# Patient Record
Sex: Female | Born: 1965 | Race: Black or African American | Hispanic: No | Smoking: Never smoker
Health system: Southern US, Community
[De-identification: ages and names within clinical notes are randomized; demographics above are authoritative.]

## PROBLEM LIST (undated history)

## (undated) DIAGNOSIS — E7800 Pure hypercholesterolemia, unspecified: Secondary | ICD-10-CM

## (undated) DIAGNOSIS — Z7952 Long term (current) use of systemic steroids: Secondary | ICD-10-CM

## (undated) DIAGNOSIS — M069 Rheumatoid arthritis, unspecified: Secondary | ICD-10-CM

## (undated) DIAGNOSIS — K219 Gastro-esophageal reflux disease without esophagitis: Secondary | ICD-10-CM

## (undated) DIAGNOSIS — D649 Anemia, unspecified: Secondary | ICD-10-CM

## (undated) DIAGNOSIS — N3281 Overactive bladder: Secondary | ICD-10-CM

## (undated) DIAGNOSIS — I10 Essential (primary) hypertension: Secondary | ICD-10-CM

## (undated) DIAGNOSIS — N185 Chronic kidney disease, stage 5: Secondary | ICD-10-CM

## (undated) DIAGNOSIS — K5900 Constipation, unspecified: Secondary | ICD-10-CM

## (undated) DIAGNOSIS — Z8739 Personal history of other diseases of the musculoskeletal system and connective tissue: Secondary | ICD-10-CM

## (undated) DIAGNOSIS — E559 Vitamin D deficiency, unspecified: Secondary | ICD-10-CM

## (undated) DIAGNOSIS — M329 Systemic lupus erythematosus, unspecified: Secondary | ICD-10-CM

## (undated) DIAGNOSIS — Z79899 Other long term (current) drug therapy: Secondary | ICD-10-CM

## (undated) DIAGNOSIS — I214 Non-ST elevation (NSTEMI) myocardial infarction: Secondary | ICD-10-CM

## (undated) DIAGNOSIS — R600 Localized edema: Secondary | ICD-10-CM

## (undated) DIAGNOSIS — I2510 Atherosclerotic heart disease of native coronary artery without angina pectoris: Secondary | ICD-10-CM

## (undated) DIAGNOSIS — R197 Diarrhea, unspecified: Secondary | ICD-10-CM

## (undated) DIAGNOSIS — H35719 Central serous chorioretinopathy, unspecified eye: Secondary | ICD-10-CM

## (undated) DIAGNOSIS — G9332 Myalgic encephalomyelitis/chronic fatigue syndrome: Secondary | ICD-10-CM

## (undated) HISTORY — DX: Central serous chorioretinopathy, unspecified eye: H35.719

## (undated) HISTORY — DX: Rheumatoid arthritis, unspecified: M06.9

## (undated) HISTORY — PX: TUBAL LIGATION: SHX77

## (undated) HISTORY — PX: MOUTH SURGERY: SHX715

## (undated) HISTORY — PX: UMBILICAL HERNIA REPAIR: SHX196

## (undated) HISTORY — DX: Localized edema: R60.0

## (undated) HISTORY — PX: HERNIA REPAIR: SHX51

## (undated) HISTORY — DX: Long term (current) use of systemic steroids: Z79.52

## (undated) HISTORY — PX: BREAST SURGERY: SHX581

## (undated) HISTORY — DX: Constipation, unspecified: K59.00

## (undated) HISTORY — DX: Other long term (current) drug therapy: Z79.899

## (undated) HISTORY — DX: Overactive bladder: N32.81

## (undated) HISTORY — PX: LAPAROSCOPIC CHOLECYSTECTOMY: SUR755

## (undated) HISTORY — DX: Vitamin D deficiency, unspecified: E55.9

## (undated) HISTORY — DX: Myalgic encephalomyelitis/chronic fatigue syndrome: G93.32

---

## 2011-01-25 DIAGNOSIS — M329 Systemic lupus erythematosus, unspecified: Secondary | ICD-10-CM | POA: Diagnosis present

## 2011-01-25 DIAGNOSIS — M3214 Glomerular disease in systemic lupus erythematosus: Secondary | ICD-10-CM | POA: Insufficient documentation

## 2011-10-10 DIAGNOSIS — N049 Nephrotic syndrome with unspecified morphologic changes: Secondary | ICD-10-CM | POA: Insufficient documentation

## 2012-01-17 DIAGNOSIS — I10 Essential (primary) hypertension: Secondary | ICD-10-CM | POA: Diagnosis present

## 2013-09-10 DIAGNOSIS — Z79899 Other long term (current) drug therapy: Secondary | ICD-10-CM | POA: Insufficient documentation

## 2014-01-13 DIAGNOSIS — M1A09X0 Idiopathic chronic gout, multiple sites, without tophus (tophi): Secondary | ICD-10-CM | POA: Insufficient documentation

## 2014-02-18 DIAGNOSIS — D631 Anemia in chronic kidney disease: Secondary | ICD-10-CM | POA: Insufficient documentation

## 2014-02-18 DIAGNOSIS — N184 Chronic kidney disease, stage 4 (severe): Secondary | ICD-10-CM

## 2014-03-24 ENCOUNTER — Encounter (HOSPITAL_BASED_OUTPATIENT_CLINIC_OR_DEPARTMENT_OTHER): Payer: Self-pay | Admitting: Emergency Medicine

## 2014-03-24 ENCOUNTER — Emergency Department (HOSPITAL_BASED_OUTPATIENT_CLINIC_OR_DEPARTMENT_OTHER): Payer: Medicare Other

## 2014-03-24 ENCOUNTER — Emergency Department (HOSPITAL_BASED_OUTPATIENT_CLINIC_OR_DEPARTMENT_OTHER)
Admission: EM | Admit: 2014-03-24 | Discharge: 2014-03-24 | Disposition: A | Discharge status: Other Acute Inpatient Hospital | Payer: Medicare Other | Attending: Emergency Medicine | Admitting: Emergency Medicine

## 2014-03-24 DIAGNOSIS — Z792 Long term (current) use of antibiotics: Secondary | ICD-10-CM | POA: Insufficient documentation

## 2014-03-24 DIAGNOSIS — R197 Diarrhea, unspecified: Secondary | ICD-10-CM | POA: Insufficient documentation

## 2014-03-24 DIAGNOSIS — Z87448 Personal history of other diseases of urinary system: Secondary | ICD-10-CM | POA: Insufficient documentation

## 2014-03-24 DIAGNOSIS — Z431 Encounter for attention to gastrostomy: Secondary | ICD-10-CM | POA: Insufficient documentation

## 2014-03-24 DIAGNOSIS — Z8739 Personal history of other diseases of the musculoskeletal system and connective tissue: Secondary | ICD-10-CM | POA: Insufficient documentation

## 2014-03-24 DIAGNOSIS — K5669 Other intestinal obstruction: Secondary | ICD-10-CM | POA: Diagnosis not present

## 2014-03-24 DIAGNOSIS — I10 Essential (primary) hypertension: Secondary | ICD-10-CM | POA: Insufficient documentation

## 2014-03-24 DIAGNOSIS — K56609 Unspecified intestinal obstruction, unspecified as to partial versus complete obstruction: Secondary | ICD-10-CM

## 2014-03-24 DIAGNOSIS — Z4659 Encounter for fitting and adjustment of other gastrointestinal appliance and device: Secondary | ICD-10-CM

## 2014-03-24 DIAGNOSIS — Z3202 Encounter for pregnancy test, result negative: Secondary | ICD-10-CM | POA: Diagnosis not present

## 2014-03-24 DIAGNOSIS — R109 Unspecified abdominal pain: Secondary | ICD-10-CM

## 2014-03-24 DIAGNOSIS — Z79899 Other long term (current) drug therapy: Secondary | ICD-10-CM | POA: Insufficient documentation

## 2014-03-24 HISTORY — DX: Essential (primary) hypertension: I10

## 2014-03-24 HISTORY — DX: Systemic lupus erythematosus, unspecified: M32.9

## 2014-03-24 LAB — CBC WITH DIFFERENTIAL/PLATELET
BASOS ABS: 0 10*3/uL (ref 0.0–0.1)
BASOS PCT: 0 % (ref 0–1)
Band Neutrophils: 3 % (ref 0–10)
Blasts: 0 %
EOS PCT: 0 % (ref 0–5)
Eosinophils Absolute: 0 10*3/uL (ref 0.0–0.7)
HCT: 36.8 % (ref 36.0–46.0)
HEMOGLOBIN: 11.5 g/dL — AB (ref 12.0–15.0)
LYMPHS PCT: 24 % (ref 12–46)
Lymphs Abs: 2.2 10*3/uL (ref 0.7–4.0)
MCH: 29.7 pg (ref 26.0–34.0)
MCHC: 31.3 g/dL (ref 30.0–36.0)
MCV: 95.1 fL (ref 78.0–100.0)
MONO ABS: 0.6 10*3/uL (ref 0.1–1.0)
MONOS PCT: 6 % (ref 3–12)
MYELOCYTES: 0 %
Metamyelocytes Relative: 0 %
NEUTROS ABS: 6.5 10*3/uL (ref 1.7–7.7)
NRBC: 0 /100{WBCs}
Neutrophils Relative %: 67 % (ref 43–77)
PLATELETS: 210 10*3/uL (ref 150–400)
Promyelocytes Absolute: 0 %
RBC: 3.87 MIL/uL (ref 3.87–5.11)
RDW: 13.4 % (ref 11.5–15.5)
WBC: 9.3 10*3/uL (ref 4.0–10.5)

## 2014-03-24 LAB — URINE MICROSCOPIC-ADD ON

## 2014-03-24 LAB — PREGNANCY, URINE: PREG TEST UR: NEGATIVE

## 2014-03-24 LAB — COMPREHENSIVE METABOLIC PANEL
ALT: 11 U/L (ref 0–35)
ANION GAP: 7 (ref 5–15)
AST: 15 U/L (ref 0–37)
Albumin: 2.9 g/dL — ABNORMAL LOW (ref 3.5–5.2)
Alkaline Phosphatase: 67 U/L (ref 39–117)
BUN: 34 mg/dL — AB (ref 6–23)
CALCIUM: 8.3 mg/dL — AB (ref 8.4–10.5)
CO2: 27 mmol/L (ref 19–32)
Chloride: 111 mEq/L (ref 96–112)
Creatinine, Ser: 2.18 mg/dL — ABNORMAL HIGH (ref 0.50–1.10)
GFR, EST AFRICAN AMERICAN: 29 mL/min — AB (ref >90)
GFR, EST NON AFRICAN AMERICAN: 25 mL/min — AB (ref >90)
GLUCOSE: 125 mg/dL — AB (ref 70–99)
Potassium: 3.8 mmol/L (ref 3.5–5.1)
SODIUM: 145 mmol/L (ref 135–145)
TOTAL PROTEIN: 5.8 g/dL — AB (ref 6.0–8.3)
Total Bilirubin: 0.5 mg/dL (ref 0.3–1.2)

## 2014-03-24 LAB — URINALYSIS, ROUTINE W REFLEX MICROSCOPIC
Bilirubin Urine: NEGATIVE
GLUCOSE, UA: NEGATIVE mg/dL
KETONES UR: NEGATIVE mg/dL
Leukocytes, UA: NEGATIVE
Nitrite: NEGATIVE
Protein, ur: >300 mg/dL — AB
SPECIFIC GRAVITY, URINE: 1.017 (ref 1.005–1.030)
Urobilinogen, UA: 0.2 mg/dL (ref 0.0–1.0)
pH: 5.5 (ref 5.0–8.0)

## 2014-03-24 LAB — LIPASE, BLOOD: Lipase: 24 U/L (ref 11–59)

## 2014-03-24 MED ORDER — MORPHINE SULFATE 4 MG/ML IJ SOLN
4.0000 mg | Freq: Once | INTRAMUSCULAR | Status: AC
  Administered 2014-03-24: 4 mg via INTRAVENOUS
  Filled 2014-03-24: qty 1

## 2014-03-24 MED ORDER — ONDANSETRON HCL 4 MG/2ML IJ SOLN
4.0000 mg | Freq: Once | INTRAMUSCULAR | Status: AC
  Administered 2014-03-24: 4 mg via INTRAVENOUS
  Filled 2014-03-24: qty 2

## 2014-03-24 MED ORDER — SODIUM CHLORIDE 0.9 % IV BOLUS (SEPSIS)
1000.0000 mL | Freq: Once | INTRAVENOUS | Status: AC
  Administered 2014-03-24: 1000 mL via INTRAVENOUS

## 2014-03-24 MED ORDER — SODIUM CHLORIDE 0.9 % IV SOLN
Freq: Once | INTRAVENOUS | Status: AC
  Administered 2014-03-24: 16:00:00 via INTRAVENOUS

## 2014-03-24 MED ORDER — SODIUM CHLORIDE 0.9 % IV SOLN: Freq: Once | INTRAVENOUS | Status: DC

## 2014-03-24 NOTE — ED Notes (Signed)
Pt notified that she will be admitted to [facility] as requested but there is a list of 5 pts waiting for beds ahead of her. It will be late afternoon before she is given a room assignment.

## 2014-03-24 NOTE — ED Provider Notes (Signed)
CSN: [id number]     Arrival date & time 03/24/14  1009 History  This chart was scribed for Ezequiel Essex, MD by Ludger Nutting, ED Scribe. This patient was seen in room MH12/MH12 and the patient's care was started 11:34 AM.    Chief Complaint  Patient presents with  . Diarrhea   The history is provided by the patient. No language interpreter was used.     HPI Comments: Lindsey York is a 48 y.o. female with past medical history of cholecystectomy, lupus, HTN who presents to the Emergency Department complaining of intermittent episodes of vomiting that began last night with associated diarrhea and abdominal pain. She reports having several episodes of vomiting and diarrhea today. She reports taking Tylenol last night but was unable to tolerate the BP medication today. She states she takes Bactrim 3 times each week which is prescribed by her PCP.  She denies fever, CP, SOB, dizziness, lightheadedness. She denies recent foreign travel or sick contacts.   Past Medical History  Diagnosis Date  . Hypertension   . Systemic lupus   . Renal disorder    Past Surgical History  Procedure Laterality Date  . Cholecystectomy    . Tubal ligation     No family history on file. History  Substance Use Topics  . Smoking status: Never Smoker   . Smokeless tobacco: Not on file  . Alcohol Use: No   OB History    No data available     Review of Systems  A complete 10 system review of systems was obtained and all systems are negative except as noted in the HPI and PMH.    Allergies  Codeine  Home Medications   Prior to Admission medications   Medication Sig Start Date End Date Taking? Authorizing Provider  acyclovir (ZOVIRAX) 800 MG tablet Take 800 mg by mouth 5 (five) times daily.   Yes Historical Provider, MD  allopurinol (ZYLOPRIM) 100 MG tablet Take 100 mg by mouth daily.   Yes Historical Provider, MD  amLODipine (NORVASC) 10 MG tablet Take 10 mg by mouth daily.   Yes Historical  Provider, MD  benazepril (LOTENSIN) 20 MG tablet Take 20 mg by mouth daily.   Yes Historical Provider, MD  cloNIDine (CATAPRES) 0.1 MG tablet Take 0.1 mg by mouth 2 (two) times daily.   Yes Historical Provider, MD  Darbepoetin Alfa-Polysorbate (ARANESP, ALB FREE, SURECLICK IJ) Inject as directed.   Yes Historical Provider, MD  furosemide (LASIX) 80 MG tablet Take 80 mg by mouth.   Yes Historical Provider, MD  losartan (COZAAR) 50 MG tablet Take 50 mg by mouth daily.   Yes Historical Provider, MD  mycophenolate (CELLCEPT) 500 MG tablet Take 500 mg by mouth 2 (two) times daily.   Yes Historical Provider, MD  omeprazole (PRILOSEC) 20 MG capsule Take 20 mg by mouth daily.   Yes Historical Provider, MD  potassium chloride SA (K-DUR,KLOR-CON) 20 MEQ tablet Take 20 mEq by mouth 2 (two) times daily.   Yes Historical Provider, MD  PREDNISONE PO Take by mouth.   Yes Historical Provider, MD  rosuvastatin (CRESTOR) 20 MG tablet Take 20 mg by mouth daily.   Yes Historical Provider, MD  sulfamethoxazole-trimethoprim (BACTRIM,SEPTRA) 400-80 MG per tablet Take 1 tablet by mouth 3 (three) times a week.   Yes Historical Provider, MD  tacrolimus (PROGRAF) 0.5 MG capsule Take 0.5 mg by mouth 2 (two) times daily.   Yes Historical Provider, MD  tacrolimus (PROGRAF) 1 MG capsule Take 1  mg by mouth 2 (two) times daily.   Yes Historical Provider, MD   BP 142/99 mmHg  Pulse 110  Temp(Src) 99.1 F (37.3 C)  Ht 5\' 3"  (1.6 m)  Wt 197 lb (89.359 kg)  BMI 34.91 kg/m2  SpO2 100% Physical Exam  Constitutional: She is oriented to person, place, and time. She appears well-developed and well-nourished. No distress.  HENT:  Head: Normocephalic and atraumatic.  Mouth/Throat: Oropharynx is clear and moist. No oropharyngeal exudate.  Eyes: Conjunctivae and EOM are normal. Pupils are equal, round, and reactive to light.  Neck: Normal range of motion. Neck supple.  No meningismus.  Cardiovascular: Normal rate, regular rhythm,  normal heart sounds and intact distal pulses.   No murmur heard. Pulmonary/Chest: Effort normal and breath sounds normal. No respiratory distress.  Abdominal: Soft. There is no tenderness. There is no rebound and no guarding.  Mild, diffuse abdominal tenderness, no guarding or rebound.   Genitourinary:  No CVA tenderness   Musculoskeletal: Normal range of motion. She exhibits no edema or tenderness.  Neurological: She is alert and oriented to person, place, and time. No cranial nerve deficit. She exhibits normal muscle tone. Coordination normal.  No ataxia on finger to nose bilaterally. No pronator drift. 5/5 strength throughout. CN 2-12 intact. Negative Romberg. Equal grip strength. Sensation intact. Gait is normal.   Skin: Skin is warm.  Psychiatric: She has a normal mood and affect. Her behavior is normal.  Nursing note and vitals reviewed.   ED Course  Procedures (including critical care time)  DIAGNOSTIC STUDIES: N/A  COORDINATION OF CARE: 11:40 AM Discussed treatment plan with pt at bedside and pt agreed to plan.   Labs Review Labs Reviewed  CBC WITH DIFFERENTIAL - Abnormal; Notable for the following:    Hemoglobin 11.5 (*)    All other components within normal limits  COMPREHENSIVE METABOLIC PANEL - Abnormal; Notable for the following:    Glucose, Bld 125 (*)    BUN 34 (*)    Creatinine, Ser 2.18 (*)    Calcium 8.3 (*)    Total Protein 5.8 (*)    Albumin 2.9 (*)    GFR calc non Af Amer 25 (*)    GFR calc Af Amer 29 (*)    All other components within normal limits  PREGNANCY, URINE  LIPASE, BLOOD  URINALYSIS, ROUTINE W REFLEX MICROSCOPIC    Imaging Review Ct Abdomen Pelvis Wo Contrast  03/24/2014   CLINICAL DATA:  Diarrhea. Hypertension and lupus. Renal insufficiency.  EXAM: CT ABDOMEN AND PELVIS WITHOUT CONTRAST  TECHNIQUE: Multidetector CT imaging of the abdomen and pelvis was performed following the standard protocol without IV contrast.  COMPARISON:  None.   FINDINGS: Lung bases are clear.  Mild pericardial thickening.  Small hiatal hernia. Moderately long segment of thickened small bowel in the mid abdomen. This appears to be jejunum and is causing mild obstruction. Mildly distended proximal jejunum with air-fluid levels. The terminal ileum is not thickened. The colon is decompressed and not thickened.  There is a small amount of free fluid around the liver and in the pelvis. No abscess. No free air.  Liver and spleen are negative. Pancreas is normal. Gallbladder surgically absent  Kidneys are normal. No renal obstruction or stone. No renal mass. Urinary bladder normal. Uterus is normal.  No mass or adenopathy.  Lumbar spine is negative.  IMPRESSION: Moderately long segment of thickened jejunum causing a mild level of proximal small bowel obstruction. Jejunal thickening could be due  to infection. Vasculitis also possible. Bowel ischemia could be present due to vasculitis or embolic disease. The aorta appears normal however intravenous contrast was not administered  Mild ascites around the liver and in the pelvis  Hiatal hernia.   Electronically Signed   By: Franchot Gallo M.D.   On: 03/24/2014 14:52     EKG Interpretation None      MDM   Final diagnoses:  Diarrhea  Abdominal pain   History of lupus on chronic immunosuppression presenting with nausea, vomiting and diarrhea that onset yesterday morning. Denies fever. Denies sick contacts at home. She is on prophylactic Bactrim 3 times a week.   Creatinine 2.18. No comparison available. Patient states she believes this is her baseline.  CT shows thickened jejunum causing SBO.  Continue IVF, place NGT.  Patient prefers admission to [facility] as that's where he nephrologist is.  Dw Dr. Mertie Moores of hospitalist who accepts. Tachycardia persists.  Continue IVF, NGT decompression.  BP 142/99 mmHg  Pulse 110  Temp(Src) 99.1 F (37.3 C)  Ht 5\' 3"  (1.6 m)  Wt 197 lb (89.359 kg)  BMI 34.91 kg/m2  SpO2  100%    I personally performed the services described in this documentation, which was scribed in my presence. The recorded information has been reviewed and is accurate.   Ezequiel Essex, MD 03/24/14 [phone]

## 2014-03-24 NOTE — ED Notes (Signed)
N/v/d since 7a yesterday morning. Able to keep water down but nothing else. diarrheaX4 and emesis X6+ since yesterday morning. Denies fever.

## 2014-03-24 NOTE — ED Notes (Signed)
Report to staff at [facility]

## 2014-03-24 NOTE — ED Notes (Signed)
Pt unable to void at present time.

## 2014-03-24 NOTE — ED Notes (Signed)
Unable to void

## 2014-03-24 NOTE — ED Notes (Signed)
Diarrhea and vomiting since last night. Abdominal bloating and pain. She took Tylenol for the pain last night. She vomited this am after taking her BP medication.

## 2014-07-01 DIAGNOSIS — R251 Tremor, unspecified: Secondary | ICD-10-CM | POA: Insufficient documentation

## 2015-03-19 DIAGNOSIS — R682 Dry mouth, unspecified: Secondary | ICD-10-CM

## 2015-03-19 DIAGNOSIS — K117 Disturbances of salivary secretion: Secondary | ICD-10-CM | POA: Insufficient documentation

## 2015-03-19 DIAGNOSIS — H608X2 Other otitis externa, left ear: Secondary | ICD-10-CM | POA: Insufficient documentation

## 2016-07-30 ENCOUNTER — Emergency Department (HOSPITAL_BASED_OUTPATIENT_CLINIC_OR_DEPARTMENT_OTHER)
Admission: EM | Admit: 2016-07-30 | Discharge: 2016-07-30 | Disposition: A | Discharge status: Home / Self Care | Payer: Medicare HMO | Attending: Emergency Medicine | Admitting: Emergency Medicine

## 2016-07-30 ENCOUNTER — Encounter (HOSPITAL_BASED_OUTPATIENT_CLINIC_OR_DEPARTMENT_OTHER): Payer: Self-pay

## 2016-07-30 ENCOUNTER — Emergency Department (HOSPITAL_BASED_OUTPATIENT_CLINIC_OR_DEPARTMENT_OTHER): Payer: Medicare HMO

## 2016-07-30 DIAGNOSIS — R05 Cough: Secondary | ICD-10-CM | POA: Insufficient documentation

## 2016-07-30 DIAGNOSIS — Z79899 Other long term (current) drug therapy: Secondary | ICD-10-CM | POA: Insufficient documentation

## 2016-07-30 DIAGNOSIS — J3489 Other specified disorders of nose and nasal sinuses: Secondary | ICD-10-CM | POA: Insufficient documentation

## 2016-07-30 DIAGNOSIS — I10 Essential (primary) hypertension: Secondary | ICD-10-CM | POA: Insufficient documentation

## 2016-07-30 DIAGNOSIS — Z8719 Personal history of other diseases of the digestive system: Secondary | ICD-10-CM | POA: Diagnosis not present

## 2016-07-30 DIAGNOSIS — R042 Hemoptysis: Secondary | ICD-10-CM | POA: Diagnosis present

## 2016-07-30 NOTE — ED Triage Notes (Addendum)
Pt reports ongoing cough and congestion during the morning since the "winter" - states this morning she coughed up blood (when clearing her throat) and became concerned. Reports ENT at [facility] states her nasal mucosa is dry, now it is burning. Oral cavity biopsy last May by ENT. Denies night sweats, fever, chills. Pt reports she had a TB test at [facility] but she is uncertain of the result - states it wasn't negative or positive but it did result "something."

## 2016-07-30 NOTE — ED Notes (Signed)
Pt discharged to home with family. NAD.  

## 2016-07-30 NOTE — ED Notes (Signed)
Notified Teri, Agricultural consultant, of patients clinical presentation of subjective complaints of coughing up blood and questionable unknown TB results in the past by ENT physicians at [facility].

## 2016-07-30 NOTE — ED Provider Notes (Signed)
[facility] DEPT [facility] Provider Note   CSN: [id number] Arrival date & time: 07/30/16  1300     History   Chief Complaint Chief Complaint  Patient presents with  . Blood in oral mucus    HPI Lindsey York is a 51 y.o. female.  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 She presents today for blood in her sputum. She reports last she frequently has blood in her sputum in the morning when clearing her throat however this morning she noted that the blood volume was more than normal. She reports that normally the bladder is streaky in the sputum however this morning the blood took up a quarter of the sputum size. She reports that the lesions in her mouth have been stable for several years but does report that she notices certain foods exacerbate these lesions which makes him bleed more. She reports that she's been eating some of these foods more frequently recently. She denies any fevers, chills, hemoptysis, chest pain, shortness of breath, nausea, vomiting. Denies any other physical complaints.  Patient reported that she's had TB testing this year that has come up indeterminate/inconclusive. She denies any known exposures however is taking immunosuppressive medicine for her lupus.  Past Medical History:  Diagnosis Date  . Hypertension   . Renal disorder   . Systemic lupus ([place])     There are no active problems to display for this patient.   Past Surgical History:  Procedure Laterality Date  . CHOLECYSTECTOMY    . OTHER SURGICAL HISTORY     oral cavity biopsy  . TUBAL LIGATION      OB History    No data available       Home Medications    Prior to Admission medications   Medication Sig Start Date End Date Taking? Authorizing Provider  acyclovir (ZOVIRAX) 800 MG tablet Take 800 mg by mouth 5 (five) times daily.   Yes  Historical Provider, MD  allopurinol (ZYLOPRIM) 100 MG tablet Take 100 mg by mouth daily.   Yes Historical Provider, MD  amLODipine (NORVASC) 10 MG tablet Take 10 mg by mouth daily.   Yes Historical Provider, MD  benazepril (LOTENSIN) 20 MG tablet Take 20 mg by mouth daily.   Yes Historical Provider, MD  cloNIDine (CATAPRES) 0.1 MG tablet Take 0.1 mg by mouth 2 (two) times daily.   Yes Historical Provider, MD  Darbepoetin Alfa-Polysorbate (ARANESP, ALB FREE, SURECLICK IJ) Inject as directed.   Yes Historical Provider, MD  furosemide (LASIX) 80 MG tablet Take 80 mg by mouth.   Yes Historical Provider, MD  losartan (COZAAR) 50 MG tablet Take 50 mg by mouth daily.   Yes Historical Provider, MD  LOVASTATIN PO Take by mouth.   Yes Historical Provider, MD  mycophenolate (CELLCEPT) 500 MG tablet Take 500 mg by mouth 2 (two) times daily.   Yes Historical Provider, MD  omeprazole (PRILOSEC) 20 MG capsule Take 20 mg by mouth daily.   Yes Historical Provider, MD  PREDNISONE PO Take by mouth.   Yes Historical Provider, MD  potassium chloride SA (K-DUR,KLOR-CON) 20 MEQ tablet Take 20 mEq by mouth 2 (two) times daily.    Historical Provider, MD  rosuvastatin (CRESTOR) 20 MG tablet Take 20 mg by mouth daily.    Historical Provider, MD  sulfamethoxazole-trimethoprim (BACTRIM,SEPTRA) 400-80 MG per tablet Take 1 tablet by mouth 3 (  three) times a week.    Historical Provider, MD  tacrolimus (PROGRAF) 0.5 MG capsule Take 0.5 mg by mouth 2 (two) times daily.    Historical Provider, MD  tacrolimus (PROGRAF) 1 MG capsule Take 1 mg by mouth 2 (two) times daily.    Historical Provider, MD    Family History History reviewed. No pertinent family history.  Social History Social History  Substance Use Topics  . Smoking status: Never Smoker  . Smokeless tobacco: Never Used  . Alcohol use No     Allergies   Codeine   Review of Systems Review of Systems All other systems are reviewed and are negative for acute  change except as noted in the HPI   Physical Exam Updated Vital Signs BP (!) 147/102 (BP Location: Left Arm)   Pulse 81   Temp 98.1 F (36.7 C) (Oral)   Resp 19   Ht 5\' 3"  (1.6 m)   Wt 174 lb (78.9 kg)   SpO2 100%   BMI 30.82 kg/m   Physical Exam  Constitutional: She is oriented to person, place, and time. She appears well-developed and well-nourished. No distress.  HENT:  Head: Normocephalic and atraumatic.  Nose: Nose normal.  Oral lesions on the hard and soft palate (see image). Lesions also noted in both naris left greater than right.  Eyes: Conjunctivae and EOM are normal. Pupils are equal, round, and reactive to light. Right eye exhibits no discharge. Left eye exhibits no discharge. No scleral icterus.  Neck: Normal range of motion. Neck supple.  Cardiovascular: Normal rate and regular rhythm.  Exam reveals no gallop and no friction rub.   No murmur heard. Pulmonary/Chest: Effort normal and breath sounds normal. No stridor. No respiratory distress. She has no rales.  Abdominal: Soft. She exhibits no distension. There is no tenderness.  Musculoskeletal: She exhibits no edema or tenderness.  Neurological: She is alert and oriented to person, place, and time.  Skin: Skin is warm and dry. No rash noted. She is not diaphoretic. No erythema.  Psychiatric: She has a normal mood and affect.  Vitals reviewed.      ED Treatments / Results  Labs (all labs ordered are listed, but only abnormal results are displayed) Labs Reviewed - No data to display  EKG  EKG Interpretation None       Radiology Dg Chest 2 View  Result Date: 07/30/2016 CLINICAL DATA:  Coughing congestion. EXAM: CHEST  2 VIEW COMPARISON:  03/24/2014 FINDINGS: The heart size and mediastinal contours are within normal limits. Both lungs are clear. The visualized skeletal structures are unremarkable. IMPRESSION: No active cardiopulmonary disease. Electronically Signed   By: Kathreen Devoid   On: 07/30/2016  13:52    Procedures Procedures (including critical care time)  Medications Ordered in ED Medications - No data to display   Initial Impression / Assessment and Plan / ED Course  I have reviewed the triage vital signs and the nursing notes.  Pertinent labs & imaging results that were available during my care of the patient were reviewed by me and considered in my medical decision making (see chart for details).   blood likely coming from oral or nasal lesions. Patient denies any constitutional symptoms that would be concerning for tuberculosis. Chest x-ray without evidence of upper lobe opacity/lesions. Patient already has ENT follow-up and was instructed to contact them for further evaluation. She is recommended to stay away from foods that exacerbate her lesions.  The patient is safe for discharge with  strict return precautions.   Final Clinical Impressions(s) / ED Diagnoses   Final diagnoses:  History of oral lesions   Disposition: Discharge  Condition: Good  I have discussed the results, Dx and Tx plan with the patient who expressed understanding and agree(s) with the plan. Discharge instructions discussed at great length. The patient was given strict return precautions who verbalized understanding of the instructions. No further questions at time of discharge.    Discharge Medication List as of 07/30/2016  3:11 PM      Follow Up: [place] [address] [phone]  Schedule an appointment as soon as possible for a visit    Cyndie Chime, [place] [address] [phone]   As needed      Fatima Blank, MD 07/30/16 [phone]

## 2016-07-30 NOTE — ED Notes (Signed)
Pt denies any ongoing cough, only states she coughed a bit this AM. Pt has had a recent biopsy of oral mucosa.

## 2017-02-26 DIAGNOSIS — L988 Other specified disorders of the skin and subcutaneous tissue: Secondary | ICD-10-CM | POA: Insufficient documentation

## 2017-07-11 DIAGNOSIS — B001 Herpesviral vesicular dermatitis: Secondary | ICD-10-CM | POA: Insufficient documentation

## 2017-07-11 DIAGNOSIS — N3281 Overactive bladder: Secondary | ICD-10-CM | POA: Insufficient documentation

## 2017-08-26 ENCOUNTER — Other Ambulatory Visit: Payer: Self-pay

## 2017-08-26 ENCOUNTER — Inpatient Hospital Stay (HOSPITAL_BASED_OUTPATIENT_CLINIC_OR_DEPARTMENT_OTHER)
Admission: EM | Admit: 2017-08-26 | Discharge: 2017-08-31 | DRG: 247 | Disposition: A | Discharge status: Home / Self Care | Payer: Medicare HMO | Attending: Internal Medicine | Admitting: Internal Medicine

## 2017-08-26 ENCOUNTER — Other Ambulatory Visit (HOSPITAL_COMMUNITY): Payer: Self-pay

## 2017-08-26 ENCOUNTER — Encounter (HOSPITAL_BASED_OUTPATIENT_CLINIC_OR_DEPARTMENT_OTHER): Payer: Self-pay | Admitting: Emergency Medicine

## 2017-08-26 ENCOUNTER — Emergency Department (HOSPITAL_BASED_OUTPATIENT_CLINIC_OR_DEPARTMENT_OTHER): Payer: Medicare HMO

## 2017-08-26 DIAGNOSIS — Z6829 Body mass index (BMI) 29.0-29.9, adult: Secondary | ICD-10-CM | POA: Diagnosis not present

## 2017-08-26 DIAGNOSIS — Z79899 Other long term (current) drug therapy: Secondary | ICD-10-CM

## 2017-08-26 DIAGNOSIS — N179 Acute kidney failure, unspecified: Secondary | ICD-10-CM | POA: Diagnosis not present

## 2017-08-26 DIAGNOSIS — D631 Anemia in chronic kidney disease: Secondary | ICD-10-CM | POA: Diagnosis not present

## 2017-08-26 DIAGNOSIS — E785 Hyperlipidemia, unspecified: Secondary | ICD-10-CM | POA: Diagnosis not present

## 2017-08-26 DIAGNOSIS — K5900 Constipation, unspecified: Secondary | ICD-10-CM | POA: Diagnosis not present

## 2017-08-26 DIAGNOSIS — E875 Hyperkalemia: Secondary | ICD-10-CM | POA: Diagnosis present

## 2017-08-26 DIAGNOSIS — Z8249 Family history of ischemic heart disease and other diseases of the circulatory system: Secondary | ICD-10-CM | POA: Diagnosis not present

## 2017-08-26 DIAGNOSIS — IMO0002 Reserved for concepts with insufficient information to code with codable children: Secondary | ICD-10-CM

## 2017-08-26 DIAGNOSIS — Z955 Presence of coronary angioplasty implant and graft: Secondary | ICD-10-CM

## 2017-08-26 DIAGNOSIS — E669 Obesity, unspecified: Secondary | ICD-10-CM | POA: Diagnosis present

## 2017-08-26 DIAGNOSIS — M329 Systemic lupus erythematosus, unspecified: Secondary | ICD-10-CM | POA: Diagnosis not present

## 2017-08-26 DIAGNOSIS — E1165 Type 2 diabetes mellitus with hyperglycemia: Secondary | ICD-10-CM | POA: Diagnosis present

## 2017-08-26 DIAGNOSIS — Z7682 Awaiting organ transplant status: Secondary | ICD-10-CM | POA: Diagnosis not present

## 2017-08-26 DIAGNOSIS — E1122 Type 2 diabetes mellitus with diabetic chronic kidney disease: Secondary | ICD-10-CM | POA: Diagnosis not present

## 2017-08-26 DIAGNOSIS — M109 Gout, unspecified: Secondary | ICD-10-CM | POA: Diagnosis not present

## 2017-08-26 DIAGNOSIS — N185 Chronic kidney disease, stage 5: Secondary | ICD-10-CM | POA: Diagnosis not present

## 2017-08-26 DIAGNOSIS — I120 Hypertensive chronic kidney disease with stage 5 chronic kidney disease or end stage renal disease: Secondary | ICD-10-CM | POA: Diagnosis not present

## 2017-08-26 DIAGNOSIS — R072 Precordial pain: Secondary | ICD-10-CM

## 2017-08-26 DIAGNOSIS — I214 Non-ST elevation (NSTEMI) myocardial infarction: Secondary | ICD-10-CM | POA: Diagnosis not present

## 2017-08-26 DIAGNOSIS — R778 Other specified abnormalities of plasma proteins: Secondary | ICD-10-CM

## 2017-08-26 DIAGNOSIS — N186 End stage renal disease: Secondary | ICD-10-CM

## 2017-08-26 DIAGNOSIS — I10 Essential (primary) hypertension: Secondary | ICD-10-CM | POA: Diagnosis present

## 2017-08-26 DIAGNOSIS — R7989 Other specified abnormal findings of blood chemistry: Secondary | ICD-10-CM

## 2017-08-26 HISTORY — DX: Gastro-esophageal reflux disease without esophagitis: K21.9

## 2017-08-26 HISTORY — DX: Atherosclerotic heart disease of native coronary artery without angina pectoris: I25.10

## 2017-08-26 HISTORY — DX: Non-ST elevation (NSTEMI) myocardial infarction: I21.4

## 2017-08-26 HISTORY — DX: Chronic kidney disease, stage 5: N18.5

## 2017-08-26 HISTORY — DX: Personal history of other diseases of the musculoskeletal system and connective tissue: Z87.39

## 2017-08-26 HISTORY — DX: Anemia, unspecified: D64.9

## 2017-08-26 HISTORY — DX: Pure hypercholesterolemia, unspecified: E78.00

## 2017-08-26 LAB — CBC
HCT: 37.6 % (ref 36.0–46.0)
Hemoglobin: 11.8 g/dL — ABNORMAL LOW (ref 12.0–15.0)
MCH: 29.8 pg (ref 26.0–34.0)
MCHC: 31.4 g/dL (ref 30.0–36.0)
MCV: 94.9 fL (ref 78.0–100.0)
PLATELETS: 261 10*3/uL (ref 150–400)
RBC: 3.96 MIL/uL (ref 3.87–5.11)
RDW: 15.1 % (ref 11.5–15.5)
WBC: 10.5 10*3/uL (ref 4.0–10.5)

## 2017-08-26 LAB — COMPREHENSIVE METABOLIC PANEL
ALBUMIN: 3.8 g/dL (ref 3.5–5.0)
ALT: 11 U/L — ABNORMAL LOW (ref 14–54)
ANION GAP: 11 (ref 5–15)
AST: 14 U/L — ABNORMAL LOW (ref 15–41)
Alkaline Phosphatase: 98 U/L (ref 38–126)
BUN: 57 mg/dL — ABNORMAL HIGH (ref 6–20)
CALCIUM: 8.4 mg/dL — AB (ref 8.9–10.3)
CO2: 18 mmol/L — ABNORMAL LOW (ref 22–32)
Chloride: 112 mmol/L — ABNORMAL HIGH (ref 101–111)
Creatinine, Ser: 3.9 mg/dL — ABNORMAL HIGH (ref 0.44–1.00)
GFR calc non Af Amer: 12 mL/min — ABNORMAL LOW (ref >60)
GFR, EST AFRICAN AMERICAN: 14 mL/min — AB (ref >60)
GLUCOSE: 166 mg/dL — AB (ref 65–99)
POTASSIUM: 4.6 mmol/L (ref 3.5–5.1)
Sodium: 141 mmol/L (ref 135–145)
Total Bilirubin: 0.2 mg/dL — ABNORMAL LOW (ref 0.3–1.2)
Total Protein: 7.4 g/dL (ref 6.5–8.1)

## 2017-08-26 LAB — URINALYSIS, ROUTINE W REFLEX MICROSCOPIC
Bilirubin Urine: NEGATIVE
GLUCOSE, UA: 100 mg/dL — AB
KETONES UR: NEGATIVE mg/dL
LEUKOCYTES UA: NEGATIVE
Nitrite: NEGATIVE
Specific Gravity, Urine: 1.02 (ref 1.005–1.030)
pH: 6.5 (ref 5.0–8.0)

## 2017-08-26 LAB — LIPASE, BLOOD: LIPASE: 45 U/L (ref 11–51)

## 2017-08-26 LAB — TROPONIN I: Troponin I: 0.07 ng/mL (ref <0.03)

## 2017-08-26 LAB — URINALYSIS, MICROSCOPIC (REFLEX)

## 2017-08-26 MED ORDER — NITROGLYCERIN 2 % TD OINT
1.0000 [in_us] | TOPICAL_OINTMENT | Freq: Four times a day (QID) | TRANSDERMAL | Status: DC
  Administered 2017-08-26 – 2017-08-27 (×3): 1 [in_us] via TOPICAL
  Filled 2017-08-26 (×3): qty 1

## 2017-08-26 MED ORDER — CLONIDINE HCL 0.1 MG PO TABS
0.1000 mg | ORAL_TABLET | Freq: Once | ORAL | Status: AC
  Administered 2017-08-26: 0.1 mg via ORAL
  Filled 2017-08-26: qty 1

## 2017-08-26 MED ORDER — ASPIRIN 81 MG PO CHEW
324.0000 mg | CHEWABLE_TABLET | Freq: Once | ORAL | Status: AC
Start: 2017-08-26 — End: 2017-08-26
  Administered 2017-08-26: 324 mg via ORAL
  Filled 2017-08-26: qty 4

## 2017-08-26 MED ORDER — LOSARTAN POTASSIUM 25 MG PO TABS
50.0000 mg | ORAL_TABLET | Freq: Once | ORAL | Status: AC
  Administered 2017-08-26: 50 mg via ORAL
  Filled 2017-08-26: qty 2

## 2017-08-26 MED ORDER — OXYCODONE-ACETAMINOPHEN 5-325 MG PO TABS
1.0000 | ORAL_TABLET | Freq: Once | ORAL | Status: AC
  Administered 2017-08-26: 1 via ORAL
  Filled 2017-08-26: qty 1

## 2017-08-26 NOTE — Progress Notes (Signed)
52 yo female with Lupus, CKD c/o chest pain and bilateral arm pain. Per ED currently chest pain free.  Trop 0.07 EKG t inversion in V1-3  ED requesting admission for bilateral arm pain as well as chest pain.

## 2017-08-26 NOTE — ED Provider Notes (Signed)
[facility] EMERGENCY DEPARTMENT Provider Note   CSN: [id number] Arrival date & time: 08/26/17  1959     History   Chief Complaint Chief Complaint  Patient presents with  . Chest Pain    HPI Lindsey York is a 52 y.o. female.  HPI Patient reports that she has developed right arm pain as well as some degree of left arm pain.  This is been going on since yesterday.  There is aching as well as some paresthesia.  He has had several episodes of pressure in her chest.  She reports that the pressure in the chest is typically brought on by activity such as walking or slightly more physical exertion.  The component of the right arm discomfort has been consistent, somewhat worse with exertion but is there at rest 2.  Patient has not been having any chest pain at rest.  She does not currently have chest pain.  He denies shortness of breath.  No cough or fever.  Patient does have renal failure.  She is not on dialysis at this time but is being referred for transplant.  Ports she makes urine.  She has not had pain burning or flank pain.  Patient's renal failure is secondary to lupus history.  No known cardiac history.  She has had persistent hypertension.  She reports she is compliant with her medications. Past Medical History:  Diagnosis Date  . Hypertension   . Renal disorder   . 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 Chest pain 08/26/2017    Past Surgical History:  Procedure Laterality Date  . CHOLECYSTECTOMY    . OTHER SURGICAL HISTORY     oral cavity biopsy  . TUBAL LIGATION       OB History   None      Home Medications    Prior to Admission medications   Medication Sig Start Date End Date Taking? Authorizing Provider  allopurinol (ZYLOPRIM) 100 MG tablet Take 100 mg by mouth daily.   Yes [provider]  amLODipine (NORVASC) 10 MG tablet Take 10 mg by mouth daily.   Yes [provider]  benazepril  (LOTENSIN) 20 MG tablet Take 20 mg by mouth daily.   Yes [provider]  cloNIDine (CATAPRES) 0.1 MG tablet Take 0.1 mg by mouth 2 (two) times daily.   Yes [provider]  Darbepoetin Alfa-Polysorbate (ARANESP, ALB FREE, SURECLICK IJ) Inject as directed.   Yes [provider]  losartan (COZAAR) 50 MG tablet Take 50 mg by mouth daily.   Yes [provider]  LOVASTATIN PO Take by mouth.   Yes [provider]  mycophenolate (CELLCEPT) 250 MG capsule Take 500 mg by mouth 2 (two) times daily. 500mg  in am and 250mg  pm   Yes [provider]  PREDNISONE PO Take by mouth.   Yes [provider]  ranitidine (ZANTAC) 150 MG capsule Take 150 mg by mouth 2 (two) times daily.   Yes [provider]  acyclovir (ZOVIRAX) 800 MG tablet Take 800 mg by mouth 5 (five) times daily.    [provider]  furosemide (LASIX) 80 MG tablet Take 40 mg by mouth.     [provider]  mycophenolate (CELLCEPT) 500 MG tablet Take 500 mg by mouth 2 (two) times daily.    [provider]  omeprazole (PRILOSEC) 20 MG capsule Take 20 mg by mouth daily.    [provider]  potassium chloride SA (K-DUR,KLOR-CON) 20 MEQ tablet Take 20 mEq by mouth 2 (two) times daily.    [provider]  rosuvastatin (CRESTOR) 20 MG tablet Take 20 mg by mouth daily.    [provider]  sulfamethoxazole-trimethoprim (BACTRIM,SEPTRA) 400-80 MG per tablet Take 1 tablet by mouth 3 (three) times a week.    [provider]  tacrolimus (PROGRAF) 0.5 MG capsule Take 0.5 mg by mouth 2 (two) times daily.    [provider]  tacrolimus (PROGRAF) 1 MG capsule Take 1 mg by mouth 2 (two) times daily.    [provider]    Family History History reviewed. No pertinent family history.  Social History Social History   Tobacco Use  . Smoking status: Never Smoker  . Smokeless tobacco: Never Used  Substance Use  Topics  . Alcohol use: No  . Drug use: Not on file     Allergies   Codeine   Review of Systems Review of Systems 10 Systems reviewed and are negative for acute change except as noted in the HPI.   Physical Exam Updated Vital Signs BP (!) 137/92   Pulse 78   Temp 98.5 F (36.9 C) (Oral)   Resp 18   Ht 5\' 3"  (1.6 m)   Wt 77.1 kg (170 lb)   SpO2 99%   BMI 30.11 kg/m   Physical Exam General: Patient is alert and appropriate.  She does not have any respiratory distress at rest.  Mental status clear. HEENT normal cephalic atraumatic.  Mucous memories pink and moist. Cardiovascular: Heart is regular.  No rub murmur gallop. Pulmonary: Lungs are grossly clear.  No wheeze rhonchi or rale. Abdomen: Soft nontender.  No guarding. Extremities: Trace peripheral edema.  Calves are soft and nontender.  Distal pulses are 2+ and symmetric. Neurologic: Patient is alert and appropriate.  Movements are coordinated purposeful symmetric. Skin: Warm and dry.  ED Treatments / Results  Labs (all labs ordered are listed, but only abnormal results are displayed) Labs Reviewed  CBC - Abnormal; Notable for the following components:      Result Value   Hemoglobin 11.8 (*)    All other components within normal limits  TROPONIN I - Abnormal; Notable for the following components:   Troponin I 0.07 (*)    All other components within normal limits  COMPREHENSIVE METABOLIC PANEL - Abnormal; Notable for the following components:   Chloride 112 (*)    CO2 18 (*)    Glucose, Bld 166 (*)    BUN 57 (*)    Creatinine, Ser 3.90 (*)    Calcium 8.4 (*)    AST 14 (*)    ALT 11 (*)    Total Bilirubin 0.2 (*)    GFR calc non Af Amer 12 (*)    GFR calc Af Amer 14 (*)    All other components within normal limits  LIPASE, BLOOD  URINALYSIS, ROUTINE W REFLEX MICROSCOPIC  CK TOTAL AND CKMB (NOT AT [facility])    EKG EKG Interpretation  Date/Time:  Sunday Aug 26 2017 20:06:00 EDT Ventricular Rate:  74 PR  Interval:  126 QRS Duration: 76 QT Interval:  412 QTC Calculation: 457 R Axis:   2 Text Interpretation:  Normal sinus rhythm ST & T wave abnormality, consider anterior ischemia Abnormal ECG agree Confirmed by Charlesetta Shanks [phone]) on 08/26/2017 9:11:21 PM   Radiology Dg Chest 2 View  Result Date: 08/26/2017 CLINICAL DATA:  Chest pain EXAM: CHEST -  2 VIEW COMPARISON:  July 30, 2016 FINDINGS: There is no edema or consolidation. Heart is borderline enlarged with pulmonary vascularity normal. No adenopathy. No bone lesions. IMPRESSION: Heart borderline enlarged.  No edema or consolidation. Electronically Signed   By: Lowella Grip III M.D.   On: 08/26/2017 21:45    Procedures Procedures (including critical care time) CRITICAL CARE Performed by: Si Gaul   Total critical care time: 30  minutes  Critical care time was exclusive of separately billable procedures and treating other patients.  Critical care was necessary to treat or prevent imminent or life-threatening deterioration.  Critical care was time spent personally by me on the following activities: development of treatment plan with patient and/or surrogate as well as nursing, discussions with consultants, evaluation of patient's response to treatment, examination of patient, obtaining history from patient or surrogate, ordering and performing treatments and interventions, ordering and review of laboratory studies, ordering and review of radiographic studies, pulse oximetry and re-evaluation of patient's condition. Medications Ordered in ED Medications  nitroGLYCERIN (NITROGLYN) 2 % ointment 1 inch (1 inch Topical Given 08/26/17 2304)  cloNIDine (CATAPRES) tablet 0.1 mg (0.1 mg Oral Given 08/26/17 2156)  oxyCODONE-acetaminophen (PERCOCET/ROXICET) 5-325 MG per tablet 1 tablet (1 tablet Oral Given 08/26/17 2156)  aspirin chewable tablet 324 mg (324 mg Oral Given 08/26/17 2307)  losartan (COZAAR) tablet 50 mg (50 mg Oral Given  08/26/17 2331)     Initial Impression / Assessment and Plan / ED Course  I have reviewed the triage vital signs and the nursing notes.  Pertinent labs & imaging results that were available during my care of the patient were reviewed by me and considered in my medical decision making (see chart for details).    Consult: Reviewed with Dr. Maudie Mercury [facility] hospitalist for admission.  Final Clinical Impressions(s) / ED Diagnoses   Final diagnoses:  Precordial chest pain  Troponin I above reference range  Lupus ([address])  ESRD (end stage renal disease) ([place])   Presents as outlined above.  She had 2 days of arm pain.  There is some qualities to this that suggest musculoskeletal or radiculopathy.  Patient however also reports she has been getting chest pressure that notably has been occurring with exertion.  Patient does not have known cardiac history but does have risk factors with history of hypertension and ESRD.  There is mild troponin elevation.  Possibly this is secondary to chronic kidney disease but there are no prior troponin values for comparison.  She has nonspecific ST changes on EKG, no STEMI pattern.  No old comparisons available.  Patient is treated with nitroglycerin, aspirin and her evening clonidine and Cozaar for blood pressure.  Plan will be for admission for rule out of ACS and arm pain with severe comorbid illnesses.  ED Discharge Orders    None       Charlesetta Shanks, MD 08/26/17 2349

## 2017-08-26 NOTE — ED Notes (Signed)
Patient transported to X-ray 

## 2017-08-26 NOTE — ED Triage Notes (Signed)
Patient states that she is having pain to her right arm and mid chest - her arm started to bother her yesterday and the chest pain started about 30 minutes ago

## 2017-08-26 NOTE — ED Notes (Signed)
Dr Dorothyann Gibbs aware that the troponin is elevated at 0.07.

## 2017-08-26 NOTE — ED Notes (Signed)
MD with pt  

## 2017-08-27 ENCOUNTER — Encounter (HOSPITAL_COMMUNITY): Payer: Self-pay | Admitting: Family Medicine

## 2017-08-27 DIAGNOSIS — E1165 Type 2 diabetes mellitus with hyperglycemia: Secondary | ICD-10-CM | POA: Diagnosis not present

## 2017-08-27 DIAGNOSIS — I2510 Atherosclerotic heart disease of native coronary artery without angina pectoris: Secondary | ICD-10-CM | POA: Diagnosis not present

## 2017-08-27 DIAGNOSIS — Z8249 Family history of ischemic heart disease and other diseases of the circulatory system: Secondary | ICD-10-CM | POA: Diagnosis not present

## 2017-08-27 DIAGNOSIS — I10 Essential (primary) hypertension: Secondary | ICD-10-CM

## 2017-08-27 DIAGNOSIS — E875 Hyperkalemia: Secondary | ICD-10-CM | POA: Diagnosis not present

## 2017-08-27 DIAGNOSIS — R7989 Other specified abnormal findings of blood chemistry: Secondary | ICD-10-CM

## 2017-08-27 DIAGNOSIS — N184 Chronic kidney disease, stage 4 (severe): Secondary | ICD-10-CM | POA: Diagnosis not present

## 2017-08-27 DIAGNOSIS — Z7682 Awaiting organ transplant status: Secondary | ICD-10-CM | POA: Diagnosis not present

## 2017-08-27 DIAGNOSIS — N189 Chronic kidney disease, unspecified: Secondary | ICD-10-CM | POA: Diagnosis not present

## 2017-08-27 DIAGNOSIS — R748 Abnormal levels of other serum enzymes: Secondary | ICD-10-CM | POA: Diagnosis not present

## 2017-08-27 DIAGNOSIS — E785 Hyperlipidemia, unspecified: Secondary | ICD-10-CM | POA: Diagnosis not present

## 2017-08-27 DIAGNOSIS — N289 Disorder of kidney and ureter, unspecified: Secondary | ICD-10-CM | POA: Insufficient documentation

## 2017-08-27 DIAGNOSIS — R778 Other specified abnormalities of plasma proteins: Secondary | ICD-10-CM | POA: Insufficient documentation

## 2017-08-27 DIAGNOSIS — D631 Anemia in chronic kidney disease: Secondary | ICD-10-CM | POA: Diagnosis not present

## 2017-08-27 DIAGNOSIS — E669 Obesity, unspecified: Secondary | ICD-10-CM | POA: Diagnosis present

## 2017-08-27 DIAGNOSIS — R072 Precordial pain: Secondary | ICD-10-CM | POA: Diagnosis not present

## 2017-08-27 DIAGNOSIS — M329 Systemic lupus erythematosus, unspecified: Secondary | ICD-10-CM | POA: Diagnosis not present

## 2017-08-27 DIAGNOSIS — I120 Hypertensive chronic kidney disease with stage 5 chronic kidney disease or end stage renal disease: Secondary | ICD-10-CM | POA: Diagnosis not present

## 2017-08-27 DIAGNOSIS — I214 Non-ST elevation (NSTEMI) myocardial infarction: Principal | ICD-10-CM

## 2017-08-27 DIAGNOSIS — N185 Chronic kidney disease, stage 5: Secondary | ICD-10-CM | POA: Diagnosis not present

## 2017-08-27 DIAGNOSIS — K5900 Constipation, unspecified: Secondary | ICD-10-CM | POA: Diagnosis not present

## 2017-08-27 DIAGNOSIS — R079 Chest pain, unspecified: Secondary | ICD-10-CM | POA: Diagnosis not present

## 2017-08-27 DIAGNOSIS — Z79899 Other long term (current) drug therapy: Secondary | ICD-10-CM | POA: Diagnosis not present

## 2017-08-27 DIAGNOSIS — N186 End stage renal disease: Secondary | ICD-10-CM | POA: Insufficient documentation

## 2017-08-27 DIAGNOSIS — M109 Gout, unspecified: Secondary | ICD-10-CM | POA: Diagnosis present

## 2017-08-27 DIAGNOSIS — Z6829 Body mass index (BMI) 29.0-29.9, adult: Secondary | ICD-10-CM | POA: Diagnosis not present

## 2017-08-27 DIAGNOSIS — N179 Acute kidney failure, unspecified: Secondary | ICD-10-CM | POA: Diagnosis not present

## 2017-08-27 DIAGNOSIS — E1122 Type 2 diabetes mellitus with diabetic chronic kidney disease: Secondary | ICD-10-CM | POA: Diagnosis not present

## 2017-08-27 DIAGNOSIS — Z955 Presence of coronary angioplasty implant and graft: Secondary | ICD-10-CM | POA: Diagnosis not present

## 2017-08-27 LAB — CBG MONITORING, ED
GLUCOSE-CAPILLARY: 124 mg/dL — AB (ref 65–99)
Glucose-Capillary: 87 mg/dL (ref 65–99)

## 2017-08-27 LAB — CK TOTAL AND CKMB (NOT AT ARMC)
CK, MB: 1.7 ng/mL (ref 0.5–5.0)
Relative Index: 1.3 (ref 0.0–2.5)
Total CK: 131 U/L (ref 38–234)

## 2017-08-27 LAB — TROPONIN I
TROPONIN I: 11.15 ng/mL — AB (ref <0.03)
TROPONIN I: 31.83 ng/mL — AB (ref <0.03)
Troponin I: 0.73 ng/mL (ref <0.03)

## 2017-08-27 LAB — HEPARIN LEVEL (UNFRACTIONATED): Heparin Unfractionated: 0.82 IU/mL — ABNORMAL HIGH (ref 0.30–0.70)

## 2017-08-27 LAB — GLUCOSE, CAPILLARY: GLUCOSE-CAPILLARY: 117 mg/dL — AB (ref 65–99)

## 2017-08-27 MED ORDER — ROSUVASTATIN CALCIUM 20 MG PO TABS: 20.0000 mg | ORAL_TABLET | Freq: Every day | ORAL | Status: DC

## 2017-08-27 MED ORDER — LOVASTATIN 20 MG PO TABS
80.0000 mg | ORAL_TABLET | Freq: Every day | ORAL | Status: DC
  Filled 2017-08-27: qty 4

## 2017-08-27 MED ORDER — HYDRALAZINE HCL 20 MG/ML IJ SOLN
10.0000 mg | Freq: Four times a day (QID) | INTRAMUSCULAR | Status: DC | PRN
  Administered 2017-08-27 – 2017-08-28 (×2): 10 mg via INTRAVENOUS
  Filled 2017-08-27 (×2): qty 1

## 2017-08-27 MED ORDER — HEPARIN SODIUM (PORCINE) 5000 UNIT/ML IJ SOLN
4000.0000 [IU] | Freq: Once | INTRAMUSCULAR | Status: DC
  Filled 2017-08-27: qty 1

## 2017-08-27 MED ORDER — AMLODIPINE BESYLATE 10 MG PO TABS
10.0000 mg | ORAL_TABLET | Freq: Every day | ORAL | Status: DC
  Administered 2017-08-27 – 2017-08-31 (×5): 10 mg via ORAL
  Filled 2017-08-27 (×2): qty 1
  Filled 2017-08-27: qty 2
  Filled 2017-08-27 (×2): qty 1

## 2017-08-27 MED ORDER — PANTOPRAZOLE SODIUM 40 MG PO TBEC
40.0000 mg | DELAYED_RELEASE_TABLET | Freq: Every day | ORAL | Status: DC
  Administered 2017-08-27 – 2017-08-31 (×5): 40 mg via ORAL
  Filled 2017-08-27 (×5): qty 1

## 2017-08-27 MED ORDER — HEPARIN (PORCINE) IN NACL 100-0.45 UNIT/ML-% IJ SOLN
800.0000 [IU]/h | INTRAMUSCULAR | Status: DC
  Administered 2017-08-27: 900 [IU]/h via INTRAVENOUS
  Administered 2017-08-28: 800 [IU]/h via INTRAVENOUS
  Filled 2017-08-27 (×2): qty 250

## 2017-08-27 MED ORDER — ACETAMINOPHEN 325 MG PO TABS
650.0000 mg | ORAL_TABLET | Freq: Once | ORAL | Status: AC
  Administered 2017-08-27: 650 mg via ORAL
  Filled 2017-08-27: qty 2

## 2017-08-27 MED ORDER — METOPROLOL SUCCINATE ER 25 MG PO TB24
25.0000 mg | ORAL_TABLET | Freq: Every day | ORAL | Status: DC
  Filled 2017-08-27: qty 1

## 2017-08-27 MED ORDER — ONDANSETRON HCL 4 MG/2ML IJ SOLN
4.0000 mg | Freq: Four times a day (QID) | INTRAMUSCULAR | Status: DC | PRN
  Administered 2017-08-27: 4 mg via INTRAVENOUS
  Filled 2017-08-27: qty 2

## 2017-08-27 MED ORDER — ALLOPURINOL 100 MG PO TABS
100.0000 mg | ORAL_TABLET | Freq: Every day | ORAL | Status: DC
  Administered 2017-08-27 – 2017-08-31 (×6): 100 mg via ORAL
  Filled 2017-08-27 (×6): qty 1

## 2017-08-27 MED ORDER — LOSARTAN POTASSIUM 50 MG PO TABS
50.0000 mg | ORAL_TABLET | Freq: Every day | ORAL | Status: DC
  Administered 2017-08-27: 50 mg via ORAL
  Filled 2017-08-27 (×2): qty 1

## 2017-08-27 MED ORDER — HEPARIN BOLUS VIA INFUSION
4000.0000 [IU] | Freq: Once | INTRAVENOUS | Status: AC
  Administered 2017-08-27: 4000 [IU] via INTRAVENOUS

## 2017-08-27 MED ORDER — ACETAMINOPHEN 325 MG PO TABS: 650.0000 mg | ORAL_TABLET | ORAL | Status: DC | PRN

## 2017-08-27 MED ORDER — MYCOPHENOLATE MOFETIL 250 MG PO CAPS
250.0000 mg | ORAL_CAPSULE | Freq: Every day | ORAL | Status: DC
  Administered 2017-08-27 – 2017-08-30 (×3): 250 mg via ORAL
  Filled 2017-08-27 (×6): qty 1

## 2017-08-27 MED ORDER — HEPARIN (PORCINE) IN NACL 100-0.45 UNIT/ML-% IJ SOLN
14.0000 [IU]/kg/h | Freq: Once | INTRAMUSCULAR | Status: DC
  Filled 2017-08-27: qty 250

## 2017-08-27 MED ORDER — FAMOTIDINE 20 MG PO TABS
20.0000 mg | ORAL_TABLET | Freq: Two times a day (BID) | ORAL | Status: DC
  Administered 2017-08-27: 20 mg via ORAL
  Filled 2017-08-27: qty 1

## 2017-08-27 MED ORDER — AMLODIPINE BESYLATE 5 MG PO TABS: 10.0000 mg | ORAL_TABLET | Freq: Every day | ORAL | Status: DC

## 2017-08-27 MED ORDER — INSULIN ASPART 100 UNIT/ML ~~LOC~~ SOLN: 0.0000 [IU] | SUBCUTANEOUS | Status: DC

## 2017-08-27 MED ORDER — ATORVASTATIN CALCIUM 80 MG PO TABS: 80.0000 mg | ORAL_TABLET | Freq: Every day | ORAL | Status: DC

## 2017-08-27 MED ORDER — PREDNISONE 5 MG PO TABS
6.0000 mg | ORAL_TABLET | Freq: Every day | ORAL | Status: DC
  Administered 2017-08-27 – 2017-08-31 (×4): 6 mg via ORAL
  Filled 2017-08-27 (×7): qty 1

## 2017-08-27 MED ORDER — ROSUVASTATIN CALCIUM 20 MG PO TABS
40.0000 mg | ORAL_TABLET | Freq: Every day | ORAL | Status: DC
  Administered 2017-08-27: 40 mg via ORAL
  Filled 2017-08-27: qty 2

## 2017-08-27 MED ORDER — MYCOPHENOLATE MOFETIL 250 MG PO CAPS: 750.0000 mg | ORAL_CAPSULE | Freq: Two times a day (BID) | ORAL | Status: DC

## 2017-08-27 MED ORDER — FAMOTIDINE 20 MG PO TABS
20.0000 mg | ORAL_TABLET | Freq: Every day | ORAL | Status: DC
  Administered 2017-08-28 – 2017-08-31 (×4): 20 mg via ORAL
  Filled 2017-08-27 (×4): qty 1

## 2017-08-27 MED ORDER — MYCOPHENOLATE MOFETIL 250 MG PO CAPS
500.0000 mg | ORAL_CAPSULE | Freq: Every day | ORAL | Status: DC
  Administered 2017-08-27 – 2017-08-31 (×5): 500 mg via ORAL
  Filled 2017-08-27 (×7): qty 2

## 2017-08-27 MED ORDER — NITROGLYCERIN IN D5W 200-5 MCG/ML-% IV SOLN
0.0000 ug/min | Freq: Once | INTRAVENOUS | Status: AC
  Administered 2017-08-27: 5 ug/min via INTRAVENOUS
  Filled 2017-08-27: qty 250

## 2017-08-27 MED ORDER — ASPIRIN 81 MG PO CHEW
81.0000 mg | CHEWABLE_TABLET | Freq: Every day | ORAL | Status: DC
  Administered 2017-08-27 – 2017-08-31 (×5): 81 mg via ORAL
  Filled 2017-08-27 (×6): qty 1

## 2017-08-27 MED ORDER — CARVEDILOL 3.125 MG PO TABS
6.2500 mg | ORAL_TABLET | Freq: Two times a day (BID) | ORAL | Status: DC
  Administered 2017-08-27 – 2017-08-31 (×9): 6.25 mg via ORAL
  Filled 2017-08-27 (×2): qty 1
  Filled 2017-08-27 (×7): qty 2

## 2017-08-27 NOTE — ED Notes (Signed)
Report given to Bobby, RN.  

## 2017-08-27 NOTE — ED Notes (Signed)
Spoke with Dr.Singh - informed of current stable status, VSS, elevated trop as previously noted; pt remains pain free; skin dark, warm, dry; states pt informed him previously of her wishes to be transferred to [facility]; Dr. Candiss Norse states spoke with transfer center at [facility] who reports no beds available at this time - may be "days" before one becomes available; Dr. Candiss Norse states will prepare pt for transfer; however, if no beds available before pt's scheduled cath in AM, then will proceed with procedure here - pt informed of same

## 2017-08-27 NOTE — H&P (View-Only) (Signed)
Progress Note  Patient Name: Lindsey York Date of Encounter: 08/27/2017  Primary Cardiologist: No primary care provider on file.   Subjective   No further arm or chest pain on IV heparin.  Inpatient Medications    Scheduled Meds: . allopurinol  100 mg Oral Daily  . amLODipine  10 mg Oral Daily  . aspirin  81 mg Oral Daily  . carvedilol  6.25 mg Oral BID WC  . famotidine  20 mg Oral BID  . insulin aspart  0-9 Units Subcutaneous Q4H  . losartan  50 mg Oral Daily  . mycophenolate  500 mg Oral Daily   And  . mycophenolate  250 mg Oral QHS  . nitroGLYCERIN  1 inch Topical Q6H  . pantoprazole  40 mg Oral Daily  . predniSONE  6 mg Oral Q breakfast  . rosuvastatin  20 mg Oral q1800   Continuous Infusions: . heparin 900 Units/hr (08/27/17 0730)   PRN Meds: acetaminophen, hydrALAZINE, ondansetron (ZOFRAN) IV   Vital Signs    Vitals:   08/27/17 0815 08/27/17 0830 08/27/17 0900 08/27/17 0931  BP: (!) 159/104 (!) 155/100 (!) 157/107 (!) 146/103  Pulse: (!) 59 67 66   Resp: 14 13 13    Temp:      TempSrc:      SpO2: 100% 100% 100%   Weight:      Height:       No intake or output data in the 24 hours ending 08/27/17 0932 Filed Weights   08/26/17 2003  Weight: 170 lb (77.1 kg)    Telemetry    Sinus rhythm- Personally Reviewed  ECG    Sinus bradycardia 58 with nonspecific ST and T wave changes- Personally Reviewed  Physical Exam   GEN: No acute distress.   Neck: No JVD Cardiac: RRR, no murmurs, rubs, or gallops.  Respiratory: Clear to auscultation bilaterally. GI: Soft, nontender, non-distended  MS: No edema; No deformity. Neuro:  Nonfocal  Psych: Normal affect   Labs    Chemistry Recent Labs  Lab 08/26/17 2108  NA 141  K 4.6  CL 112*  CO2 18*  GLUCOSE 166*  BUN 57*  CREATININE 3.90*  CALCIUM 8.4*  PROT 7.4  ALBUMIN 3.8  AST 14*  ALT 11*  ALKPHOS 98  BILITOT 0.2*  GFRNONAA 12*  GFRAA 14*  ANIONGAP 11     Hematology Recent Labs    Lab 08/26/17 2108  WBC 10.5  RBC 3.96  HGB 11.8*  HCT 37.6  MCV 94.9  MCH 29.8  MCHC 31.4  RDW 15.1  PLT 261    Cardiac Enzymes Recent Labs  Lab 08/26/17 2108 08/27/17 0027 08/27/17 0435  TROPONINI 0.07* 0.73* 11.15*   No results for input(s): TROPIPOC in the last 168 hours.   BNPNo results for input(s): BNP, PROBNP in the last 168 hours.   DDimer No results for input(s): DDIMER in the last 168 hours.   Radiology    Dg Chest 2 View  Result Date: 08/26/2017 CLINICAL DATA:  Chest pain EXAM: CHEST - 2 VIEW COMPARISON:  July 30, 2016 FINDINGS: There is no edema or consolidation. Heart is borderline enlarged with pulmonary vascularity normal. No adenopathy. No bone lesions. IMPRESSION: Heart borderline enlarged.  No edema or consolidation. Electronically Signed   By: Lowella Grip III M.D.   On: 08/26/2017 21:45    Cardiac Studies   None performed  Patient Profile     52 y.o. female with history of lupus, CKD 4  on renal transplant list at [facility] and essential hypertension admitted with right arm and chest pain, and non-STEMI with peak troponin so far of 11 on IV heparin.  Assessment & Plan    1: Non-STEMI- EKG shows nonspecific ST and T wave changes.  Troponin elevated to 11.  Her pain is fairly recent in onset beginning just several days ago.  She is currently asymptomatic on IV heparin.  She will need diagnostic coronary angiography using limited contrast given her CKD 4.  She is on the renal transplant list at [facility].  A 2D echocardiogram has been ordered for LV function to help limit contrast exposure.  The patient understands that risks included but are not limited to stroke (1 in 1000), death (1 in 44), kidney failure [usually temporary] (1 in 500), bleeding (1 in 200), allergic reaction [possibly serious] (1 in 200). The patient understands and agrees to proceed  2: Essential hypertension- blood pressure  controlled on amlodipine, clonidine, carvedilol, and losartan.  3: Hyperlipidemia- on statin therapy  4: CKD 4- history of lupus on the renal transplant list at [facility].  Obviously this is a significant issue given the need for contrast performed coronary angiography and the possibility radiocontrast nephropathy.  Clearly she is not a candidate for renal transplant until her cardiac issues are adequately addressed.  She is concerned about the possibility of requiring dialysis which certainly is a possibility.  Ultimately she desires peritoneal dialysis but I suspect in the short-term should she have renal failure related to contrast exposure she would need a dialysis catheter placed and hemodialysis performed.  For questions or updates, please contact [facility] Please consult [email or web address] for contact info under Cardiology/STEMI.      Signed, Quay Burow, MD  08/27/2017, 9:32 AM

## 2017-08-27 NOTE — ED Provider Notes (Signed)
1:30 AM Informed by nursing that patient's repeat troponin 0 0.73.  Marked elevation from initial troponin.  EKG shows T wave inversions inferiorly but no acute ST elevations.  Patient is currently pain-free with nitroglycerin paste in place.  Blood pressure is 109/83 and her vital signs are otherwise stable.  Given concern for NSTEMI heparin drip was initiated.  Patient was updated.  Cardiology was consulted. Discussed with Dr. Rhae Hammock.  Agrees with heparin drip.  Recommends continued admission to hospitalist.  Please formally consult on arrival to [facility].  Dr. Maudie Mercury, hospitalist, was updated.  Bed change to stepdown bed.  No stepdown beds available.  Given elevating troponin, will ED to ED transfer for cardiology evaluation.   EKG Interpretation  Date/Time:  Monday Aug 27 2017 01:18:56 EDT Ventricular Rate:  62 PR Interval:  126 QRS Duration: 120 QT Interval:  444 QTC Calculation: 451 R Axis:   -18 Text Interpretation:  Sinus rhythm Ventricular premature complex Nonspecific intraventricular conduction delay Inferior infarct, old T wave inversions inferiorly when compared to prior Confirmed by Thayer Jew [phone]) on 08/27/2017 1:26:21 AM      CRITICAL CARE Performed by: Merryl Hacker   Total critical care time: 30 minutes  Critical care time was exclusive of separately billable procedures and treating other patients.  Critical care was necessary to treat or prevent imminent or life-threatening deterioration.  Critical care was time spent personally by me on the following activities: development of treatment plan with patient and/or surrogate as well as nursing, discussions with consultants, evaluation of patient's response to treatment, examination of patient, obtaining history from patient or surrogate, ordering and performing treatments and interventions, ordering and review of laboratory studies, ordering and review of radiographic studies, pulse oximetry and  re-evaluation of patient's condition.    Merryl Hacker, MD 08/27/17 [phone]

## 2017-08-27 NOTE — ED Notes (Signed)
Cards notified of pt's present status; states he will be in to assess - pt aware of same

## 2017-08-27 NOTE — ED Notes (Signed)
In to reassess; pt reports active substernal chest pain - no radiation - no associated symptoms; skin dark, warm, dry; repeat EKG done; Dr.Singh notified of same - instructed to call cards ASAP

## 2017-08-27 NOTE — ED Notes (Signed)
ED Provider at bedside. 

## 2017-08-27 NOTE — Consult Note (Signed)
Cardiology Consultation:   Patient ID: Lindsey York; [id number]; Feb 20, 1966   Admit date: 08/26/2017 Date of Consult: 08/27/2017  Primary Care Provider: Cyndie Chime, MD Primary Cardiologist: None  Patient Profile:   Lindsey York is a 52 y.o. female with a hx of CKD, lupus, HTN who is being seen today for the evaluation of chest pain at the request of Dr. Johnney Killian.  History of Present Illness:   Lindsey York is a 52 y.o. female with a hx of CKD, lupus, HTN who is being seen today for the evaluation of chest pain.  The patient has no known cardiac history but thinks she had a remote echocardiogram. She does have advanced renal disease and is listed for transplant. She has not started dialysis.   She reports that she was in her normal state of health until two days ago when she developed an aching pain in her right arm. This pain improved temporarily with doses of tylenol. Yesterday she developed significant pressure in her chest when walking around her home. She denied any associated nausea or dyspnea or diaphoresis. She denies any LE edema, which is controlled with QOD furosemide. Given her ongoing symptoms, she presented to the [facility] ED. At the outside ED SBP 100-160s. ECG showed NSR with nonspecific ST changes. Labs included Cr 3.9. Troponin initially 0.07 but rose to 0.73 on repeat check. She was given nitropaste and started on heparin gtt and transferred to [facility] for further management.  On my evaluation, the patient reports that her pain has resolved with the nitropaste. She denies any other symptoms other than those described above.   Past Medical History:  Diagnosis Date  . Hypertension   . Renal disorder   . Systemic lupus ([place])     Past Surgical History:  Procedure Laterality Date  . CHOLECYSTECTOMY    . OTHER SURGICAL HISTORY     oral cavity biopsy  . TUBAL LIGATION       Home Medications:  Prior to Admission medications   Medication Sig Start Date  End Date Taking? Authorizing Provider  allopurinol (ZYLOPRIM) 100 MG tablet Take 100 mg by mouth daily.   Yes [provider]  amLODipine (NORVASC) 10 MG tablet Take 10 mg by mouth daily.   Yes [provider]  benazepril (LOTENSIN) 20 MG tablet Take 20 mg by mouth daily.   Yes [provider]  cloNIDine (CATAPRES) 0.1 MG tablet Take 0.1 mg by mouth 2 (two) times daily.   Yes [provider]  Darbepoetin Alfa-Polysorbate (ARANESP, ALB FREE, SURECLICK IJ) Inject as directed.   Yes [provider]  losartan (COZAAR) 50 MG tablet Take 50 mg by mouth daily.   Yes [provider]  LOVASTATIN PO Take by mouth.   Yes [provider]  mycophenolate (CELLCEPT) 250 MG capsule Take 500 mg by mouth 2 (two) times daily. 500mg  in am and 250mg  pm   Yes [provider]  PREDNISONE PO Take by mouth.   Yes [provider]  ranitidine (ZANTAC) 150 MG capsule Take 150 mg by mouth 2 (two) times daily.   Yes [provider]  acyclovir (ZOVIRAX) 800 MG tablet Take 800 mg by mouth 5 (five) times daily.    [provider]  furosemide (LASIX) 80 MG tablet Take 40 mg by mouth.     [provider]  mycophenolate (CELLCEPT) 500 MG tablet Take 500 mg by mouth 2 (two) times daily.    [provider]  omeprazole ([place])  20 MG capsule Take 20 mg by mouth daily.    [provider]  potassium chloride SA (K-DUR,KLOR-CON) 20 MEQ tablet Take 20 mEq by mouth 2 (two) times daily.    [provider]  rosuvastatin (CRESTOR) 20 MG tablet Take 20 mg by mouth daily.    [provider]  sulfamethoxazole-trimethoprim (BACTRIM,SEPTRA) 400-80 MG per tablet Take 1 tablet by mouth 3 (three) times a week.    [provider]  tacrolimus (PROGRAF) 0.5 MG capsule Take 0.5 mg by mouth 2 (two) times daily.    [provider]  tacrolimus (PROGRAF) 1 MG capsule Take 1 mg by mouth 2 (two)  times daily.    [provider]    Inpatient Medications: Scheduled Meds: . aspirin  81 mg Oral Daily  . metoprolol succinate  25 mg Oral Daily  . nitroGLYCERIN  1 inch Topical Q6H  . rosuvastatin  20 mg Oral q1800   Continuous Infusions: . heparin 900 Units/hr (08/27/17 0329)   PRN Meds:   Allergies:    Allergies  Allergen Reactions  . Codeine     Social History:   Social History   Socioeconomic History  . Marital status: Divorced    Spouse name: Not on file  . Number of children: Not on file  . Years of education: Not on file  . Highest education level: Not on file  Occupational History  . Not on file  Social Needs  . Financial resource strain: Not on file  . Food insecurity:    Worry: Not on file    Inability: Not on file  . Transportation needs:    Medical: Not on file    Non-medical: Not on file  Tobacco Use  . Smoking status: Never Smoker  . Smokeless tobacco: Never Used  Substance and Sexual Activity  . Alcohol use: No  . Drug use: Not on file  . Sexual activity: Not on file  Lifestyle  . Physical activity:    Days per week: Not on file    Minutes per session: Not on file  . Stress: Not on file  Relationships  . Social connections:    Talks on phone: Not on file    Gets together: Not on file    Attends religious service: Not on file    Active member of club or organization: Not on file    Attends meetings of clubs or organizations: Not on file    Relationship status: Not on file  . Intimate partner violence:    Fear of current or ex partner: Not on file    Emotionally abused: Not on file    Physically abused: Not on file    Forced sexual activity: Not on file  Other Topics Concern  . Not on file  Social History Narrative  . Not on file    Family History:   Reports grandmother had CHF in 40s  ROS:  Please see the history of present illness.  All other ROS reviewed and negative.     Physical Exam/Data:   Vitals:   08/27/17  0030 08/27/17 0100 08/27/17 0115 08/27/17 0333  BP: 113/80 100/78 109/83 (!) 141/87  Pulse: 65 (!) 59 (!) 59 (!) 58  Resp:   12 18  Temp:    98.8 F (37.1 C)  TempSrc:    Oral  SpO2: 96% 94% 99% 100%  Weight:      Height:       No intake or output data in  the 24 hours ending 08/27/17 0413 Filed Weights   08/26/17 2003  Weight: 77.1 kg (170 lb)   Body mass index is 30.11 kg/m.  General:  Well nourished, well developed, in no acute distress  HEENT: normal Lymph: no adenopathy Neck: no JVD Cardiac:  normal S1, S2; RRR; no murmur   Lungs:  clear to auscultation bilaterally, no wheezing, rhonchi or rales  Abd: soft, nontender, no hepatomegaly  Ext: no edema Musculoskeletal:  No deformities, BUE and BLE strength normal and equal Skin: warm and dry  Neuro: No focal abnormalities noted Psych:  Normal affect   EKG:  The EKG was personally reviewed and demonstrates: As above Telemetry:  Telemetry was personally reviewed and demonstrates: Not currently on telemetry (pt located in hallway)  Relevant CV Studies: None  Laboratory Data:  Chemistry Recent Labs  Lab 08/26/17 2108  NA 141  K 4.6  CL 112*  CO2 18*  GLUCOSE 166*  BUN 57*  CREATININE 3.90*  CALCIUM 8.4*  GFRNONAA 12*  GFRAA 14*  ANIONGAP 11    Recent Labs  Lab 08/26/17 2108  PROT 7.4  ALBUMIN 3.8  AST 14*  ALT 11*  ALKPHOS 98  BILITOT 0.2*   Hematology Recent Labs  Lab 08/26/17 2108  WBC 10.5  RBC 3.96  HGB 11.8*  HCT 37.6  MCV 94.9  MCH 29.8  MCHC 31.4  RDW 15.1  PLT 261   Cardiac Enzymes Recent Labs  Lab 08/26/17 2108 08/27/17 0027  TROPONINI 0.07* 0.73*   No results for input(s): TROPIPOC in the last 168 hours.  BNPNo results for input(s): BNP, PROBNP in the last 168 hours.  DDimer No results for input(s): DDIMER in the last 168 hours.  Radiology/Studies:  Dg Chest 2 View  Result Date: 08/26/2017 CLINICAL DATA:  Chest pain EXAM: CHEST - 2 VIEW COMPARISON:  July 30, 2016  FINDINGS: There is no edema or consolidation. Heart is borderline enlarged with pulmonary vascularity normal. No adenopathy. No bone lesions. IMPRESSION: Heart borderline enlarged.  No edema or consolidation. Electronically Signed   By: Lowella Grip III M.D.   On: 08/26/2017 21:45    Assessment and Plan:   Chest pain Elevated troponin CKD/ESRD The patient has no known cardiac history but has multiple risk factors including HTN and rheumatologic disease. She has experienced several days of arm/chest discomfort with typical and atypical features. ECG shows nonspecific changes, but troponin is elevated and has risen on repeat check. Her presentation is therefore concerning for NSTEMI. She is currently chest pain free with nitropaste. I discussed different evaluation/treatment options including LHC, which is generally indicated in this situation. The patient's situation is difficult given her advanced CKD for which dialysis has not been initiated. Therefore, coordination with her primary nephrologist is appropriate with further treatment planning. -Continue to trend troponin -Continue to monitor symptoms.  -Continue heparin gtt -Starting ASA -Continue home rosuvastatin -Starting metoprolol -Echocardiogram ordered  HTN The patient is on multiple antihypertensive medications in the setting of advanced CKD.  -Starting metoprolol as above -BP control per primary team/nephrology (currently taking benazepril, losartan, clonidine, amlodipine, QOD furosemide). However, with initiation of metoprolol can consider weaning off of clonidine and/or elimination of ACEI vs ARB. -Would be cautious with furosemide to avoid hypovolemia with potential upcoming LHC.    For questions or updates, please contact [facility] Please consult [email or web address] for contact info under Cardiology/STEMI.   Signed, Nila Nephew, MD  08/27/2017 4:13 AM

## 2017-08-27 NOTE — Progress Notes (Signed)
[facility] for Heparin Indication: chest pain/ACS  Allergies  Allergen Reactions  . Hydroxychloroquine Hives and Rash  . Codeine     Patient Measurements: Height: 5\' 3"  (160 cm) Weight: 170 lb (77.1 kg) IBW/kg (Calculated) : 52.4 Heparin Dosing Weight: 70 kg  Vital Signs: Temp: 98.8 F (37.1 C) (05/27 0333) Temp Source: Oral (05/27 0333) BP: 156/98 (05/27 0945) Pulse Rate: 63 (05/27 0945)  Labs: Recent Labs    08/26/17 2108 08/27/17 0027 08/27/17 0435 08/27/17 0940  HGB 11.8*  --   --   --   HCT 37.6  --   --   --   PLT 261  --   --   --   HEPARINUNFRC  --   --   --  0.82*  CREATININE 3.90*  --   --   --   CKTOTAL 131  --   --   --   CKMB 1.7  --   --   --   TROPONINI 0.07* 0.73* 11.15*  --     Estimated Creatinine Clearance: 16.4 mL/min (A) (by C-G formula based on SCr of 3.9 mg/dL (H)).   Medical History: Past Medical History:  Diagnosis Date  . Hypertension   . Renal disorder   . Systemic lupus ([facility])     Medications:  No current facility-administered medications on file prior to encounter.    Current Outpatient Medications on File Prior to Encounter  Medication Sig Dispense Refill  . acetaminophen (TYLENOL) 325 MG tablet Take 650 mg by mouth every 6 (six) hours as needed for moderate pain.    Marland Kitchen allopurinol (ZYLOPRIM) 100 MG tablet Take 100 mg by mouth daily.    Marland Kitchen amLODipine (NORVASC) 10 MG tablet Take 10 mg by mouth daily.    . benazepril (LOTENSIN) 20 MG tablet Take 20 mg by mouth daily.    . cloNIDine (CATAPRES) 0.1 MG tablet Take 0.1 mg by mouth 2 (two) times daily.    . Darbepoetin Alfa-Polysorbate (ARANESP, ALB FREE, SURECLICK IJ) Inject 338 mg as directed every 14 (fourteen) days.     . furosemide (LASIX) 80 MG tablet Take 80 mg by mouth every other day.     . losartan (COZAAR) 50 MG tablet Take 50 mg by mouth daily.    Marland Kitchen lovastatin (MEVACOR) 20 MG tablet Take 20 mg by mouth daily.    . mycophenolate  (CELLCEPT) 250 MG capsule Take 250 mg by mouth at bedtime.     . mycophenolate (CELLCEPT) 500 MG tablet Take 500 mg by mouth every morning.     Marland Kitchen oxybutynin (DITROPAN) 5 MG tablet Take 5 mg by mouth daily as needed for bladder spasms.    . predniSONE (DELTASONE) 1 MG tablet Take 1 mg by mouth daily with breakfast.    . predniSONE (DELTASONE) 5 MG tablet Take 5 mg by mouth daily with breakfast.    . ranitidine (ZANTAC) 150 MG tablet Take 150 mg by mouth every morning.     . [DISCONTINUED] acyclovir (ZOVIRAX) 800 MG tablet Take 800 mg by mouth 5 (five) times daily.    . [DISCONTINUED] LOVASTATIN PO Take by mouth.    . [DISCONTINUED] omeprazole (PRILOSEC) 20 MG capsule Take 20 mg by mouth daily.    . [DISCONTINUED] potassium chloride SA (K-DUR,KLOR-CON) 20 MEQ tablet Take 20 mEq by mouth 2 (two) times daily.    . [DISCONTINUED] PREDNISONE PO Take by mouth.    . [DISCONTINUED] rosuvastatin (CRESTOR) 20 MG tablet  Take 20 mg by mouth daily.    . [DISCONTINUED] sulfamethoxazole-trimethoprim (BACTRIM,SEPTRA) 400-80 MG per tablet Take 1 tablet by mouth 3 (three) times a week.    . [DISCONTINUED] tacrolimus (PROGRAF) 0.5 MG capsule Take 0.5 mg by mouth 2 (two) times daily.    . [DISCONTINUED] tacrolimus (PROGRAF) 1 MG capsule Take 1 mg by mouth 2 (two) times daily.       Assessment: 52 y.o. female on heparin gtt for chest pain / ACS. Last heparin level is elevated at 0.82. Hgb 11.8, plts wnl.  Goal of Therapy:  Heparin level 0.3-0.7 units/ml Monitor platelets by anticoagulation protocol: Yes   Plan:  Decrease heparin gtt to 800 units/hr Monitor daily heparin level, CBC, s/s of bleed Check 8 hr heparin level  Jolanda Mccann J 08/27/2017,10:51 AM

## 2017-08-27 NOTE — ED Notes (Signed)
Pt ambulated to the bathroom.  

## 2017-08-27 NOTE — ED Notes (Signed)
Dr Myna Hidalgo notified of increase in troponin.  Patient remains pain free at this time.  EKG repeated per order.

## 2017-08-27 NOTE — ED Notes (Addendum)
HR noted to be in mid 34s briefly. Dr. Dina Rich notified. Repeat EKG done per order and given to MD to review. Pt states she is comfortable, pain has improved

## 2017-08-27 NOTE — Discharge Summary (Signed)
Patient Demographics  Lindsey York   UXN:[id number]  URK:[id number]  is a 52 y.o. female  DOB - 1965-09-02  08/26/2017  Transfer Date 08/27/2017  Admitting Physician Vianne Bulls, MD  Outpatient Primary MD for the patient is Cyndie Chime, MD    Place of Transfer [facility] Accepting MD - Dr Wonda Horner - Cardiology [facility]  Admission Diagnosis  Chest pain [R07.9]  Discharge Diagnosis   NSTEMI  Past Medical History:  Diagnosis Date  . Hypertension   . Renal disorder   . Systemic lupus ([place])     Past Surgical History:  Procedure Laterality Date  . CHOLECYSTECTOMY    . OTHER SURGICAL HISTORY     oral cavity biopsy  . TUBAL LIGATION      Consults  Cards, Renal  Significant Tests  Dg Chest 2 View  Result Date: 08/26/2017 CLINICAL DATA:  Chest pain EXAM: CHEST - 2 VIEW COMPARISON:  July 30, 2016 FINDINGS: There is no edema or consolidation. Heart is borderline enlarged with pulmonary vascularity normal. No adenopathy. No bone lesions. IMPRESSION: Heart borderline enlarged.  No edema or consolidation. Electronically Signed   By: Lowella Grip III M.D.   On: 08/26/2017 21:45    Hospital Course See H&P for all details in brief, [name] with history of lupus, hypertension, CKD stage V who takes CellCept and prednisone for her lupus and follows with the nephrology department at [facility] comes in with 2 to 3-day history of right arm discomfort along with some substernal pressure that developed the night of admission.  In the ER her work-up was significant for elevated troponin, she was placed on heparin drip, cardiology was consulted and she was admitted for NSTEMI.  Patient and family requested [facility] transfer if bed available as most of the physicians at their, cardiology is involved here  as well.   Assessment and plan  1. NSTEMI -now chest pain-free, troponin rise significant, she has been placed on Coreg, high intensity statin along with Nitropaste, aspirin, currently on heparin drip as well, she is currently chest pain-free, cardiology on board, most likely left heart catheter tomorrow if she stays here, if bed opens up at [facility] she will be transferred today to [facility]  2.  History of lupus causing CKD stage V.  Currently at baseline.  Continue home medications which include CellCept and prednisone, baseline creatinine is around 3.9 and she is close to baseline.  Labs reviewed from [facility]  3.  Hypertension.  Currently on combination of Coreg, Nitropaste, Norvasc and ARB.  As needed hydralazine added will monitor.  4.  Dyslipidemia.  On statin.     Today   Subjective:   Patient in bed, appears comfortable, denies any headache, no fever, no chest pain or pressure, no shortness of breath , no abdominal pain. No focal weakness.   Objective:   Blood pressure (!) 138/95, pulse 63, temperature 98.9 F (37.2 C), temperature source Oral, resp. rate 16, height 5\' 3"  (1.6 m), weight 77.1 kg (170 lb), SpO2 100 %. No intake or output data in the 24 hours ending 08/27/17 1324  Exam  Awake Alert, Oriented x 3, No new F.N deficits, Normal affect [place].AT,PERRAL Supple Neck,No JVD, No cervical lymphadenopathy appriciated.  Symmetrical Chest wall movement, Good air movement bilaterally, CTAB RRR,No Gallops,Rubs or new Murmurs, No Parasternal Heave +ve B.Sounds, Abd Soft, Non tender, No organomegaly appriciated, No rebound -guarding or rigidity. No Cyanosis, Clubbing or  edema, No new Rash or bruise    Data Review  CBC w Diff:  Lab Results  Component Value Date   WBC 10.5 08/26/2017   HGB 11.8 (L) 08/26/2017   HCT 37.6 08/26/2017   PLT 261 08/26/2017   LYMPHOPCT 24 03/24/2014   BANDSPCT 3 03/24/2014   MONOPCT 6 03/24/2014   EOSPCT 0 03/24/2014   BASOPCT 0  03/24/2014   CMP:  Lab Results  Component Value Date   NA 141 08/26/2017   K 4.6 08/26/2017   CL 112 (H) 08/26/2017   CO2 18 (L) 08/26/2017   BUN 57 (H) 08/26/2017   CREATININE 3.90 (H) 08/26/2017   PROT 7.4 08/26/2017   ALBUMIN 3.8 08/26/2017   BILITOT 0.2 (L) 08/26/2017   ALKPHOS 98 08/26/2017   AST 14 (L) 08/26/2017   ALT 11 (L) 08/26/2017   Results for CRISTELLA, STIVER (MRN [id number]) as of 08/27/2017 13:20   Ref. Range 08/26/2017 21:08 08/27/2017 00:27 08/27/2017 04:35 08/27/2017 09:38  Troponin I Latest Ref Range: <0.03 ng/mL 0.07 (HH) 0.73 (HH) 11.15 (HH) 31.83 (HH)    Home Medications  Prior to Admission medications   Medication Sig Start Date End Date Taking? Authorizing Provider  acetaminophen (TYLENOL) 325 MG tablet Take 650 mg by mouth every 6 (six) hours as needed for moderate pain.   Yes [provider]  allopurinol (ZYLOPRIM) 100 MG tablet Take 100 mg by mouth daily.   Yes [provider]  amLODipine (NORVASC) 10 MG tablet Take 10 mg by mouth daily.   Yes [provider]  benazepril (LOTENSIN) 20 MG tablet Take 20 mg by mouth daily.   Yes [provider]  cloNIDine (CATAPRES) 0.1 MG tablet Take 0.1 mg by mouth 2 (two) times daily.   Yes [provider]  Darbepoetin Alfa-Polysorbate (ARANESP, ALB FREE, SURECLICK IJ) Inject 702 mg as directed every 14 (fourteen) days.    Yes [provider]  furosemide (LASIX) 80 MG tablet Take 80 mg by mouth every other day.    Yes [provider]  losartan (COZAAR) 50 MG tablet Take 50 mg by mouth daily.   Yes [provider]  lovastatin (MEVACOR) 20 MG tablet Take 20 mg by mouth daily. 08/26/17  Yes [provider]  mycophenolate (CELLCEPT) 250 MG capsule Take 250 mg by mouth at bedtime.    Yes [provider]  mycophenolate (CELLCEPT) 500 MG tablet Take 500 mg by mouth every morning.    Yes [provider]  oxybutynin (DITROPAN) 5 MG  tablet Take 5 mg by mouth daily as needed for bladder spasms. 07/10/16  Yes [provider]  predniSONE (DELTASONE) 1 MG tablet Take 1 mg by mouth daily with breakfast.   Yes [provider]  predniSONE (DELTASONE) 5 MG tablet Take 5 mg by mouth daily with breakfast.   Yes [provider]  ranitidine (ZANTAC) 150 MG tablet Take 150 mg by mouth every morning.    Yes [provider]    Inpatient Medications  Scheduled Meds: . allopurinol  100 mg Oral Daily  . amLODipine  10 mg Oral Daily  . aspirin  81 mg Oral Daily  . carvedilol  6.25 mg Oral BID WC  . [START ON 08/28/2017] famotidine  20 mg Oral Daily  . insulin aspart  0-9 Units Subcutaneous Q4H  . losartan  50 mg Oral Daily  . mycophenolate  500 mg Oral Daily   And  . mycophenolate  250 mg Oral QHS  .  nitroGLYCERIN  1 inch Topical Q6H  . pantoprazole  40 mg Oral Daily  . predniSONE  6 mg Oral Q breakfast  . rosuvastatin  40 mg Oral q1800   Continuous Infusions: . heparin 800 Units/hr (08/27/17 1205)   PRN Meds:.acetaminophen, hydrALAZINE, ondansetron (ZOFRAN) IV   The risks and  benefits including possible death-disability of treating patient at this facility vs transporting the patinet to a higher level of care were discussed in detail with family members at bedside and the plan was acceptable to them.   Total Time in preparing paper work, todays exam and data evaluation 35 minutes  Lala Lund M.D on 08/27/2017 at 1:24 PM  [facility] Office  [phone]    \

## 2017-08-27 NOTE — ED Notes (Signed)
Ordered pt's lunch tray.

## 2017-08-27 NOTE — ED Notes (Signed)
Cards in to assess pt - pt continues to have "a little" pain to substernal chest area - orders given for NTG drip (see MAR)

## 2017-08-27 NOTE — Consult Note (Signed)
Reason for Consult: CKD stage 5 Referring Physician: Candiss Norse, MD  Lindsey York is an 52 y.o. female.  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 who presented to [facility] with substernal chest pressure radiating down her right arm associated with nausea but no shortness of breath or palpitations.  She has had rising troponin levels and ruled in for NSTEMI.  We were consulted due to concerns of her progressive CKD and the need for cardiac catheterization.  She has been followed regularly at [facility] for her CKD and is active on the transplant list and was interested in PD at renal replacement therapy.  She has not had an AVF or AVG placed, nor has she seen a surgeon for PD catheter planning.  Her Creatinine has been climbing over the last 6 months from 3.21 in January to 3.65 on 07/12/17 and 3.86 on 08/15/17.  She is also taking losartan and understands the risks of contrast including but not limited to initiating dialysis during this hospitalization.    Trend in Creatinine: Creatinine, Ser  Date/Time Value Ref Range Status  08/26/2017 09:08 PM 3.90 (H) 0.44 - 1.00 mg/dL Final  03/24/2014 01:40 PM 2.18 (H) 0.50 - 1.10 mg/dL Final    PMH:   Past Medical History:  Diagnosis Date  . Hypertension   . Renal disorder   . Systemic lupus (HCC)     PSH:   Past Surgical History:  Procedure Laterality Date  . CHOLECYSTECTOMY    . OTHER SURGICAL HISTORY     oral cavity biopsy  . TUBAL LIGATION      Allergies:  Allergies  Allergen Reactions  . Hydroxychloroquine Hives and Rash  . Codeine     Medications:   Prior to Admission medications   Medication Sig Start Date End Date Taking? Authorizing Provider  acetaminophen (TYLENOL) 325 MG tablet Take 650 mg by mouth every 6 (six) hours as needed for moderate pain.   Yes [provider]  allopurinol (ZYLOPRIM)  100 MG tablet Take 100 mg by mouth daily.   Yes [provider]  amLODipine (NORVASC) 10 MG tablet Take 10 mg by mouth daily.   Yes [provider]  benazepril (LOTENSIN) 20 MG tablet Take 20 mg by mouth daily.   Yes [provider]  cloNIDine (CATAPRES) 0.1 MG tablet Take 0.1 mg by mouth 2 (two) times daily.   Yes [provider]  Darbepoetin Alfa-Polysorbate (ARANESP, ALB FREE, SURECLICK IJ) Inject 518 mg as directed every 14 (fourteen) days.    Yes [provider]  furosemide (LASIX) 80 MG tablet Take 80 mg by mouth every other day.    Yes [provider]  losartan (COZAAR) 50 MG tablet Take 50 mg by mouth daily.   Yes [provider]  lovastatin (MEVACOR) 20 MG tablet Take 20 mg by mouth daily. 08/26/17  Yes [provider]  mycophenolate (CELLCEPT) 250 MG capsule Take 250 mg by mouth at bedtime.    Yes [provider]  mycophenolate (CELLCEPT) 500 MG tablet Take 500 mg by mouth every morning.    Yes [provider]  oxybutynin (DITROPAN) 5 MG tablet Take 5 mg by mouth daily as needed for bladder spasms. 07/10/16  Yes [provider]  predniSONE (DELTASONE) 1 MG tablet Take 1 mg by mouth daily with breakfast.   Yes [provider]  predniSONE (DELTASONE) 5 MG tablet Take 5 mg by mouth daily with breakfast.   Yes [provider]  ranitidine (ZANTAC) 150 MG tablet Take 150 mg by mouth every morning.    Yes [provider]    Inpatient medications: . allopurinol  100 mg Oral Daily  . amLODipine  10 mg Oral Daily  . aspirin  81 mg Oral Daily  . carvedilol  6.25 mg Oral BID WC  . [START ON 08/28/2017] famotidine  20 mg Oral Daily  . insulin aspart  0-9 Units Subcutaneous Q4H  . mycophenolate  500 mg Oral Daily   And  . mycophenolate  250 mg Oral QHS  . pantoprazole  40 mg Oral Daily  . predniSONE  6 mg Oral Q breakfast  . rosuvastatin  40 mg Oral q1800     Discontinued Meds:   Medications Discontinued During This Encounter  Medication Reason  . heparin ADULT infusion 100 units/mL (25000 units/263mL sodium chloride 0.45%)   . heparin injection 4,000 Units   . atorvastatin (LIPITOR) tablet 80 mg   . rosuvastatin (CRESTOR) tablet 20 mg   . acyclovir (ZOVIRAX) 800 MG tablet Patient Preference  . LOVASTATIN PO Patient Preference  . omeprazole (PRILOSEC) 20 MG capsule Patient Preference  . potassium chloride SA (K-DUR,KLOR-CON) 20 MEQ tablet Patient Preference  . PREDNISONE PO Patient Preference  . rosuvastatin (CRESTOR) 20 MG tablet Patient Preference  . sulfamethoxazole-trimethoprim (BACTRIM,SEPTRA) 400-80 MG per tablet Patient Preference  . tacrolimus (PROGRAF) 0.5 MG capsule Change in therapy  . tacrolimus (PROGRAF) 1 MG capsule Change in therapy  . mycophenolate (CELLCEPT) capsule 750 mg   . rosuvastatin (CRESTOR) tablet 20 mg Completed Course  . metoprolol succinate (TOPROL-XL) 24 hr tablet 25 mg   . amLODipine (NORVASC) tablet 10 mg   . famotidine (PEPCID) tablet 20 mg   . rosuvastatin (CRESTOR) tablet 20 mg   . losartan (COZAAR) tablet 50 mg   . nitroGLYCERIN (NITROGLYN) 2 % ointment 1 inch     Social History:  reports that she has never smoked. She has never used smokeless tobacco. She reports that she does not drink alcohol. Her drug history is not on file.  Family History:  History reviewed. No pertinent family history.  Pertinent items are noted in HPI. currently she is chest pain free Weight change:  No intake or output data in the 24 hours ending 08/27/17 1615 BP (!) 159/99   Pulse (!) 34   Temp 98.9 F (37.2 C) (Oral)   Resp 11   Ht 5\' 3"  (1.6 m)   Wt 77.1 kg (170 lb)   SpO2 (!) 87%   BMI 30.11 kg/m  Vitals:   08/27/17 1445 08/27/17 1500 08/27/17 1515 08/27/17 1530  BP: (!) 151/91 (!) 156/96 (!) 151/98 (!) 159/99  Pulse: 67 66 68 (!) 34  Resp: 16 18 13 11   Temp:      TempSrc:      SpO2: 99% 100% 100%  (!) 87%  Weight:      Height:         General appearance: alert, cooperative and no distress Head: Normocephalic, without obvious abnormality, atraumatic Neck: no adenopathy, no carotid bruit, no JVD, supple, symmetrical, trachea midline and thyroid not enlarged, symmetric, no tenderness/mass/nodules Resp: clear to auscultation bilaterally Cardio: regular rate and rhythm, S1, S2 normal, no murmur, click, rub or gallop GI: soft, non-tender; bowel sounds normal; no masses,  no organomegaly Extremities: extremities normal, atraumatic, no cyanosis or edema  Labs:  Basic Metabolic Panel: Recent Labs  Lab 08/26/17 2108  NA 141  K 4.6  CL 112*  CO2 18*  GLUCOSE 166*  BUN 57*  CREATININE 3.90*  ALBUMIN 3.8  CALCIUM 8.4*   Liver Function Tests: Recent Labs  Lab 08/26/17 2108  AST 14*  ALT 11*  ALKPHOS 98  BILITOT 0.2*  PROT 7.4  ALBUMIN 3.8   Recent Labs  Lab 08/26/17 2108  LIPASE 45   No results for input(s): AMMONIA in the last 168 hours. CBC: Recent Labs  Lab 08/26/17 2108  WBC 10.5  HGB 11.8*  HCT 37.6  MCV 94.9  PLT 261   PT/INR: @LABRCNTIP (inr:5) Cardiac Enzymes: ) Recent Labs  Lab 08/26/17 2108 08/27/17 0027 08/27/17 0435 08/27/17 0938  CKTOTAL 131  --   --   --   CKMB 1.7  --   --   --   TROPONINI 0.07* 0.73* 11.15* 31.83*   CBG: Recent Labs  Lab 08/27/17 0652 08/27/17 1321  GLUCAP 87 124*    Iron Studies: No results for input(s): IRON, TIBC, TRANSFERRIN, FERRITIN in the last 168 hours.  Xrays/Other Studies: Dg Chest 2 View  Result Date: 08/26/2017 CLINICAL DATA:  Chest pain EXAM: CHEST - 2 VIEW COMPARISON:  July 30, 2016 FINDINGS: There is no edema or consolidation. Heart is borderline enlarged with pulmonary vascularity normal. No adenopathy. No bone lesions. IMPRESSION: Heart borderline enlarged.  No edema or consolidation. Electronically Signed   By: Lowella Grip III M.D.   On: 08/26/2017 21:45     Assessment/Plan: 1.   NSTEMI- with rising troponins.  Discussed the need for cardiac catheterization and risks of contrast induced nephropathy and potential need for dialysis following intervention.  She understands and is willing to proceed.  Will stop losartan and asked Cardiology to start gentle IV NS as well as reduce amount of contrast used as possible. 2. CKD stage 5- well managed at [facility] and was interested in PD, however she may need to start with IHD and then transition to PD as an outpatient if her renal function worsens following cardiac cath.  Hold lasix as well for now as well as ARB. 3. Anemia of CKD- stable 4. HTN- off ARB and would increase beta-blocker per Cardiology for better BP control. 5. Lupus- stable 6. DM- pt with elevated glucose which may be a new diagnosis.  Per primary svc.   Governor Rooks Sabriyah Wilcher 08/27/2017, 4:15 PM

## 2017-08-27 NOTE — H&P (Signed)
History and Physical    Lindsey York TGG:[id number] DOB: 04/20/1965 DOA: 08/26/2017  PCP: Cyndie Chime, MD   Patient coming from: Home  Chief Complaint: Chest pain, arm pain   HPI: Lindsey York is a 52 y.o. female with medical history significant for lupus, hypertension, chronic kidney disease stage V not yet on dialysis, now presenting to the emergency department for evaluation of right arm ache and chest pressure.  Patient reports that she had been in her usual state of health until the day prior to her presentation when she developed an ache in her right arm.  She took acetaminophen with mild improvement but her symptoms persisted and shortly prior to arrival in the ED she developed a pressure sensation in the chest.  She feels that the chest pressure improved with rest.  She denies any cough, shortness of breath, nausea, or diaphoresis.  She has never experienced these symptoms previously.  [facility] ED Course: Upon arrival to the ED, patient is found to be afebrile, saturating well on room air, and with vitals otherwise normal.  EKG features a sinus rhythm with nonspecific T wave abnormality.  Chest x-ray is notable for borderline cardiomegaly, but no edema or consolidation.  Chemistry panel reveals a BUN of 57 and creatinine of 3.90, similar to priors.  CBC is unremarkable and initial troponin is 0.07.  Second troponin returned elevated to 0.73.  Patient was treated with 324 mg of aspirin, started on heparin infusion, and also started on metoprolol.  Cardiology was consulted by the physician, has evaluated the patient in the emergency department, and recommends a medical admission for ongoing evaluation and management.  Review of Systems:  All other systems reviewed and apart from HPI, are negative.  Past Medical History:  Diagnosis Date  . Hypertension   . Renal disorder   . Systemic lupus ([place])     Past Surgical History:  Procedure Laterality Date  .  CHOLECYSTECTOMY    . OTHER SURGICAL HISTORY     oral cavity biopsy  . TUBAL LIGATION       reports that she has never smoked. She has never used smokeless tobacco. She reports that she does not drink alcohol. Her drug history is not on file.  Allergies  Allergen Reactions  . Codeine     History reviewed. No pertinent family history.   Prior to Admission medications   Medication Sig Start Date End Date Taking? Authorizing Provider  allopurinol (ZYLOPRIM) 100 MG tablet Take 100 mg by mouth daily.   Yes [provider]  amLODipine (NORVASC) 10 MG tablet Take 10 mg by mouth daily.   Yes [provider]  benazepril (LOTENSIN) 20 MG tablet Take 20 mg by mouth daily.   Yes [provider]  cloNIDine (CATAPRES) 0.1 MG tablet Take 0.1 mg by mouth 2 (two) times daily.   Yes [provider]  Darbepoetin Alfa-Polysorbate (ARANESP, ALB FREE, SURECLICK IJ) Inject as directed.   Yes [provider]  losartan (COZAAR) 50 MG tablet Take 50 mg by mouth daily.   Yes [provider]  LOVASTATIN PO Take by mouth.   Yes [provider]  mycophenolate (CELLCEPT) 250 MG capsule Take 500 mg by mouth 2 (two) times daily. 500mg  in am and 250mg  pm   Yes [provider]  PREDNISONE PO Take by mouth.   Yes [provider]  ranitidine (ZANTAC) 150 MG capsule Take 150 mg by mouth 2 (two) times daily.   Yes [provider]  acyclovir (ZOVIRAX) 800 MG tablet Take 800 mg by mouth 5 (five) times daily.    [provider]  furosemide (LASIX) 80 MG tablet Take 40 mg by mouth.     [provider]  mycophenolate (CELLCEPT) 500 MG tablet Take 500 mg by mouth 2 (two) times daily.    [provider]  omeprazole (PRILOSEC) 20 MG capsule Take 20 mg by mouth daily.    [provider]  potassium chloride SA (K-DUR,KLOR-CON) 20 MEQ tablet Take 20 mEq by mouth 2 (two) times daily.    [provider]    rosuvastatin (CRESTOR) 20 MG tablet Take 20 mg by mouth daily.    [provider]  sulfamethoxazole-trimethoprim (BACTRIM,SEPTRA) 400-80 MG per tablet Take 1 tablet by mouth 3 (three) times a week.    [provider]  tacrolimus (PROGRAF) 0.5 MG capsule Take 0.5 mg by mouth 2 (two) times daily.    [provider]  tacrolimus (PROGRAF) 1 MG capsule Take 1 mg by mouth 2 (two) times daily.    [provider]    Physical Exam: Vitals:   08/27/17 0030 08/27/17 0100 08/27/17 0115 08/27/17 0333  BP: 113/80 100/78 109/83 (!) 141/87  Pulse: 65 (!) 59 (!) 59 (!) 58  Resp:   12 18  Temp:    98.8 F (37.1 C)  TempSrc:    Oral  SpO2: 96% 94% 99% 100%  Weight:      Height:          Constitutional: NAD, calm  Eyes: PERTLA, lids and conjunctivae normal ENMT: Mucous membranes are moist. Posterior pharynx clear of any exudate or lesions.   Neck: normal, supple, no masses, no thyromegaly Respiratory: clear to auscultation bilaterally, no wheezing, no crackles. Normal respiratory effort.    Cardiovascular: S1 & S2 heard, regular rate and rhythm. Trace pedal edema. No significant JVD. Abdomen: No distension, no tenderness, soft. Bowel sounds normal.  Musculoskeletal: no clubbing / cyanosis. No joint deformity upper and lower extremities.    Skin: no significant rashes, lesions, ulcers. Warm, dry, well-perfused. Neurologic: CN 2-12 grossly intact. Sensation intact. Strength 5/5 in all 4 limbs.  Psychiatric: Alert and oriented x 3. Pleasant and cooperative.     Labs on Admission: I have personally reviewed following labs and imaging studies  CBC: Recent Labs  Lab 08/26/17 2108  WBC 10.5  HGB 11.8*  HCT 37.6  MCV 94.9  PLT 010   Basic Metabolic Panel: Recent Labs  Lab 08/26/17 2108  NA 141  K 4.6  CL 112*  CO2 18*  GLUCOSE 166*  BUN 57*  CREATININE 3.90*  CALCIUM 8.4*   GFR: Estimated Creatinine Clearance: 16.4 mL/min (A) (by C-G formula  based on SCr of 3.9 mg/dL (H)). Liver Function Tests: Recent Labs  Lab 08/26/17 2108  AST 14*  ALT 11*  ALKPHOS 98  BILITOT 0.2*  PROT 7.4  ALBUMIN 3.8   Recent Labs  Lab 08/26/17 2108  LIPASE 45   No results for input(s): AMMONIA in the last 168 hours. Coagulation Profile: No results for input(s): INR, PROTIME in the last 168 hours. Cardiac Enzymes: Recent Labs  Lab 08/26/17 2108 08/27/17 0027  CKTOTAL 131  --   CKMB 1.7  --   TROPONINI 0.07* 0.73*   BNP (last 3 results) No results for input(s): PROBNP in the last 8760 hours. HbA1C: No results for input(s): HGBA1C in the last 72 hours. CBG: No results for input(s): GLUCAP in  the last 168 hours. Lipid Profile: No results for input(s): CHOL, HDL, LDLCALC, TRIG, CHOLHDL, LDLDIRECT in the last 72 hours. Thyroid Function Tests: No results for input(s): TSH, T4TOTAL, FREET4, T3FREE, THYROIDAB in the last 72 hours. Anemia Panel: No results for input(s): VITAMINB12, FOLATE, FERRITIN, TIBC, IRON, RETICCTPCT in the last 72 hours. Urine analysis:    Component Value Date/Time   COLORURINE YELLOW 08/26/2017 2316   APPEARANCEUR HAZY (A) 08/26/2017 2316   LABSPEC 1.020 08/26/2017 2316   PHURINE 6.5 08/26/2017 2316   GLUCOSEU 100 (A) 08/26/2017 2316   HGBUR MODERATE (A) 08/26/2017 2316   BILIRUBINUR NEGATIVE 08/26/2017 2316   KETONESUR NEGATIVE 08/26/2017 2316   PROTEINUR >300 (A) 08/26/2017 2316   UROBILINOGEN 0.2 03/24/2014 1420   NITRITE NEGATIVE 08/26/2017 2316   LEUKOCYTESUR NEGATIVE 08/26/2017 2316   Sepsis Labs: @LABRCNTIP (procalcitonin:4,lacticidven:4) )No results found for this or any previous visit (from the past 240 hour(s)).   Radiological Exams on Admission: Dg Chest 2 View  Result Date: 08/26/2017 CLINICAL DATA:  Chest pain EXAM: CHEST - 2 VIEW COMPARISON:  July 30, 2016 FINDINGS: There is no edema or consolidation. Heart is borderline enlarged with pulmonary vascularity normal. No adenopathy. No bone  lesions. IMPRESSION: Heart borderline enlarged.  No edema or consolidation. Electronically Signed   By: Lowella Grip III M.D.   On: 08/26/2017 21:45    EKG: Independently reviewed. Sinus rhythm, non-specific T-wave abnormality.   Assessment/Plan   1. Non-STEMI - Presents with 1 day of right arm ache and acute chest pressure that eased off with rest  - CXR with borderline cardiomegaly only, initial troponin 0.07, and EKG with SR and non-specific T-wave abnormality  - Pain resolved but second troponin 0.73  - She was treated with ASA 324, nitro ointment, and started on heparin infusion  - Cardiology consulting and much appreciated  - Continue cardiac monitoring, continue to trend troponin, echo is ordered, continue ASA, statin, beta-blocker, and ARB    2. CKD stage V  - SCr is 3.90 on admission, similar to priors  - Follows with nephro and transplant [facility] at [facility]  - Potassium and bicarb normal, not hypertensive, appears euvolemic  - Renally-dose medications, follow daily wt and I/O's, hold Lasix initially    3. SLE  - Follows with rheum at at [facility] and has been stable, tapering down Cellcept  - Continue Cellcept and prednisone    4. Hypertension  - BP at goal  - Started on beta-blocker by cardiology, continue ARB and Norvasc, hold clonidine and ACE for now     DVT prophylaxis: IV heparin infusion  Code Status: Full  Family Communication: Discussed with patient Consults called: Cardiology Admission status: Inpatient    Vianne Bulls, MD [facility] Pager [phone]  If 7PM-7AM, please contact night-coverage [email or web address] Password [facility]  08/27/2017, 5:33 AM

## 2017-08-27 NOTE — ED Notes (Signed)
Attempted to call report

## 2017-08-27 NOTE — ED Notes (Addendum)
Patient requested  Lovastatin instead of Rosuvastatin , she refused to take Rovustatin .Pharmacist notified on pt.'s request.

## 2017-08-27 NOTE — ED Notes (Signed)
Dr. Candiss Norse paged to inform of 3rd trop result of 31.83, pt pain free - Heparin drip infusing - NTG paste intact to rgt anterior chest wall - VSS

## 2017-08-27 NOTE — ED Notes (Signed)
Pt request blooddraw from IV. 

## 2017-08-27 NOTE — ED Notes (Signed)
EKG repeated, due to pt experiencing chest pain.

## 2017-08-27 NOTE — ED Notes (Signed)
Nephrology in to assess

## 2017-08-27 NOTE — ED Notes (Signed)
Nurse currently drawing labs 

## 2017-08-27 NOTE — ED Notes (Addendum)
EKG done at 14:05 has yesterday's date. EKG was done on Aug 27, 2017 at 14:05. A safety zone was written, due to the incorrect date. [employer] contacted.

## 2017-08-27 NOTE — Progress Notes (Addendum)
Paged by nurse regarding trop of 32. Talked with the patient, will need cath. She has 5/10 chest pain. D/C nitro paste, started on nitro gtt. Discussed with the patient regarding the need for cath, however she has CKD stage IV. Upon further discussion with the patient, she is willing to have cardiac catheterization. Her chest pain has resolved after IV nitro. Will continue to monitor and cath early morning tomorrow.   Hilbert Corrigan PA Pager: [phone]  As above, called to see patient for  recurrent chest pain.  She was admitted and ruled in for a non-ST elevation myocardial infarction (troponin 32).  She had recurrent chest pain earlier which has now resolved.  Follow-up electrocardiogram shows no ST changes.  Dr. Marval Regal is also seeing patient.  We will continue with aspirin, heparin, carvedilol and statin.  Discontinue Nitropaste and begin IV nitroglycerin.  Transfer to CCU.  Patient will clearly need cardiac catheterization.  However I would like to avoid contrast nephropathy if at all possible.  We will discontinue Cozaar.  Begin IV hydration.  Follow closely for volume excess.  If she remains pain-free we will plan cardiac catheterization tomorrow.   She understands the risks of cardiac catheterization including myocardial infarction, CVA and death.  She also understands the risk of worsening renal insufficiency including progressing to dialysis.  However there are no other good options and she is agreeable to proceed.  If she has more chest pain despite IV nitroglycerin we would need to proceed with cardiac catheterization emergently. Kirk Ruths, MD

## 2017-08-27 NOTE — ED Notes (Signed)
Pt's CBG result was 124. Informed Larene Beach - RN.

## 2017-08-27 NOTE — ED Notes (Signed)
[facility] here to transport pt. Report called to [facility]

## 2017-08-27 NOTE — Progress Notes (Signed)
Progress Note  Patient Name: Lindsey York Date of Encounter: 08/27/2017  Primary Cardiologist: No primary care provider on file.   Subjective   No further arm or chest pain on IV heparin.  Inpatient Medications    Scheduled Meds: . allopurinol  100 mg Oral Daily  . amLODipine  10 mg Oral Daily  . aspirin  81 mg Oral Daily  . carvedilol  6.25 mg Oral BID WC  . famotidine  20 mg Oral BID  . insulin aspart  0-9 Units Subcutaneous Q4H  . losartan  50 mg Oral Daily  . mycophenolate  500 mg Oral Daily   And  . mycophenolate  250 mg Oral QHS  . nitroGLYCERIN  1 inch Topical Q6H  . pantoprazole  40 mg Oral Daily  . predniSONE  6 mg Oral Q breakfast  . rosuvastatin  20 mg Oral q1800   Continuous Infusions: . heparin 900 Units/hr (08/27/17 0730)   PRN Meds: acetaminophen, hydrALAZINE, ondansetron (ZOFRAN) IV   Vital Signs    Vitals:   08/27/17 0815 08/27/17 0830 08/27/17 0900 08/27/17 0931  BP: (!) 159/104 (!) 155/100 (!) 157/107 (!) 146/103  Pulse: (!) 59 67 66   Resp: 14 13 13    Temp:      TempSrc:      SpO2: 100% 100% 100%   Weight:      Height:       No intake or output data in the 24 hours ending 08/27/17 0932 Filed Weights   08/26/17 2003  Weight: 170 lb (77.1 kg)    Telemetry    Sinus rhythm- Personally Reviewed  ECG    Sinus bradycardia 58 with nonspecific ST and T wave changes- Personally Reviewed  Physical Exam   GEN: No acute distress.   Neck: No JVD Cardiac: RRR, no murmurs, rubs, or gallops.  Respiratory: Clear to auscultation bilaterally. GI: Soft, nontender, non-distended  MS: No edema; No deformity. Neuro:  Nonfocal  Psych: Normal affect   Labs    Chemistry Recent Labs  Lab 08/26/17 2108  NA 141  K 4.6  CL 112*  CO2 18*  GLUCOSE 166*  BUN 57*  CREATININE 3.90*  CALCIUM 8.4*  PROT 7.4  ALBUMIN 3.8  AST 14*  ALT 11*  ALKPHOS 98  BILITOT 0.2*  GFRNONAA 12*  GFRAA 14*  ANIONGAP 11     Hematology Recent Labs    Lab 08/26/17 2108  WBC 10.5  RBC 3.96  HGB 11.8*  HCT 37.6  MCV 94.9  MCH 29.8  MCHC 31.4  RDW 15.1  PLT 261    Cardiac Enzymes Recent Labs  Lab 08/26/17 2108 08/27/17 0027 08/27/17 0435  TROPONINI 0.07* 0.73* 11.15*   No results for input(s): TROPIPOC in the last 168 hours.   BNPNo results for input(s): BNP, PROBNP in the last 168 hours.   DDimer No results for input(s): DDIMER in the last 168 hours.   Radiology    Dg Chest 2 View  Result Date: 08/26/2017 CLINICAL DATA:  Chest pain EXAM: CHEST - 2 VIEW COMPARISON:  July 30, 2016 FINDINGS: There is no edema or consolidation. Heart is borderline enlarged with pulmonary vascularity normal. No adenopathy. No bone lesions. IMPRESSION: Heart borderline enlarged.  No edema or consolidation. Electronically Signed   By: Lowella Grip III M.D.   On: 08/26/2017 21:45    Cardiac Studies   None performed  Patient Profile     52 y.o. female with history of lupus, CKD 4  on renal transplant list at [facility] and essential hypertension admitted with right arm and chest pain, and non-STEMI with peak troponin so far of 11 on IV heparin.  Assessment & Plan    1: Non-STEMI- EKG shows nonspecific ST and T wave changes.  Troponin elevated to 11.  Her pain is fairly recent in onset beginning just several days ago.  She is currently asymptomatic on IV heparin.  She will need diagnostic coronary angiography using limited contrast given her CKD 4.  She is on the renal transplant list at [facility].  A 2D echocardiogram has been ordered for LV function to help limit contrast exposure.  The patient understands that risks included but are not limited to stroke (1 in 1000), death (1 in 31), kidney failure [usually temporary] (1 in 500), bleeding (1 in 200), allergic reaction [possibly serious] (1 in 200). The patient understands and agrees to proceed  2: Essential hypertension- blood pressure  controlled on amlodipine, clonidine, carvedilol, and losartan.  3: Hyperlipidemia- on statin therapy  4: CKD 4- history of lupus on the renal transplant list at [facility].  Obviously this is a significant issue given the need for contrast performed coronary angiography and the possibility radiocontrast nephropathy.  Clearly she is not a candidate for renal transplant until her cardiac issues are adequately addressed.  She is concerned about the possibility of requiring dialysis which certainly is a possibility.  Ultimately she desires peritoneal dialysis but I suspect in the short-term should she have renal failure related to contrast exposure she would need a dialysis catheter placed and hemodialysis performed.  For questions or updates, please contact [place] Please consult [email or web address] for contact info under Cardiology/STEMI.      Signed, Quay Burow, MD  08/27/2017, 9:32 AM

## 2017-08-27 NOTE — Progress Notes (Signed)
[place] for Heparin Indication: chest pain/ACS  Allergies  Allergen Reactions  . Codeine     Patient Measurements: Height: 5\' 3"  (160 cm) Weight: 170 lb (77.1 kg) IBW/kg (Calculated) : 52.4 Heparin Dosing Weight: 70 kg  Vital Signs: Temp: 98.5 F (36.9 C) (05/26 2001) Temp Source: Oral (05/26 2001) BP: 109/83 (05/27 0115) Pulse Rate: 59 (05/27 0115)  Labs: Recent Labs    08/26/17 2108 08/27/17 0027  HGB 11.8*  --   HCT 37.6  --   PLT 261  --   CREATININE 3.90*  --   CKTOTAL 131  --   CKMB PENDING  --   TROPONINI 0.07* 0.73*    Estimated Creatinine Clearance: 16.4 mL/min (A) (by C-G formula based on SCr of 3.9 mg/dL (H)).   Medical History: Past Medical History:  Diagnosis Date  . Hypertension   . Renal disorder   . Systemic lupus (HCC)     Medications:  No current facility-administered medications on file prior to encounter.    Current Outpatient Medications on File Prior to Encounter  Medication Sig Dispense Refill  . allopurinol (ZYLOPRIM) 100 MG tablet Take 100 mg by mouth daily.    Marland Kitchen amLODipine (NORVASC) 10 MG tablet Take 10 mg by mouth daily.    . benazepril (LOTENSIN) 20 MG tablet Take 20 mg by mouth daily.    . cloNIDine (CATAPRES) 0.1 MG tablet Take 0.1 mg by mouth 2 (two) times daily.    . Darbepoetin Alfa-Polysorbate (ARANESP, ALB FREE, SURECLICK IJ) Inject as directed.    Marland Kitchen losartan (COZAAR) 50 MG tablet Take 50 mg by mouth daily.    Marland Kitchen LOVASTATIN PO Take by mouth.    . mycophenolate (CELLCEPT) 250 MG capsule Take 500 mg by mouth 2 (two) times daily. 500mg  in am and 250mg  pm    . PREDNISONE PO Take by mouth.    . ranitidine (ZANTAC) 150 MG capsule Take 150 mg by mouth 2 (two) times daily.    Marland Kitchen acyclovir (ZOVIRAX) 800 MG tablet Take 800 mg by mouth 5 (five) times daily.    . furosemide (LASIX) 80 MG tablet Take 40 mg by mouth.     . mycophenolate (CELLCEPT) 500 MG tablet Take 500 mg by mouth 2 (two) times  daily.    Marland Kitchen omeprazole (PRILOSEC) 20 MG capsule Take 20 mg by mouth daily.    . potassium chloride SA (K-DUR,KLOR-CON) 20 MEQ tablet Take 20 mEq by mouth 2 (two) times daily.    . rosuvastatin (CRESTOR) 20 MG tablet Take 20 mg by mouth daily.    Marland Kitchen sulfamethoxazole-trimethoprim (BACTRIM,SEPTRA) 400-80 MG per tablet Take 1 tablet by mouth 3 (three) times a week.    . tacrolimus (PROGRAF) 0.5 MG capsule Take 0.5 mg by mouth 2 (two) times daily.    . tacrolimus (PROGRAF) 1 MG capsule Take 1 mg by mouth 2 (two) times daily.       Assessment: 52 y.o. female with chest pain for heparin  Goal of Therapy:  Heparin level 0.3-0.7 units/ml Monitor platelets by anticoagulation protocol: Yes   Plan:  Heparin 4000 units IV bolus, then start heparin 900 units/hr Check heparin level in 6 hours.   Simeon Vera, Bronson Curb 08/27/2017,1:41 AM

## 2017-08-27 NOTE — ED Notes (Signed)
Pt's lunch tray arrived. 

## 2017-08-27 NOTE — ED Notes (Signed)
Cardiology at the bedside.

## 2017-08-28 ENCOUNTER — Other Ambulatory Visit: Payer: Self-pay

## 2017-08-28 ENCOUNTER — Encounter (HOSPITAL_COMMUNITY): Payer: Self-pay | Admitting: Cardiovascular Disease

## 2017-08-28 ENCOUNTER — Other Ambulatory Visit (HOSPITAL_COMMUNITY): Payer: Medicare HMO

## 2017-08-28 ENCOUNTER — Encounter (HOSPITAL_COMMUNITY)
Admission: EM | Disposition: A | Discharge status: Home / Self Care | Payer: Self-pay | Source: Home / Self Care | Attending: Internal Medicine

## 2017-08-28 DIAGNOSIS — I2510 Atherosclerotic heart disease of native coronary artery without angina pectoris: Secondary | ICD-10-CM

## 2017-08-28 HISTORY — PX: CORONARY STENT INTERVENTION: CATH118234

## 2017-08-28 HISTORY — PX: LEFT HEART CATH AND CORONARY ANGIOGRAPHY: CATH118249

## 2017-08-28 LAB — CBC
HEMATOCRIT: 36.2 % (ref 36.0–46.0)
Hemoglobin: 10.7 g/dL — ABNORMAL LOW (ref 12.0–15.0)
MCH: 28.2 pg (ref 26.0–34.0)
MCHC: 29.6 g/dL — AB (ref 30.0–36.0)
MCV: 95.5 fL (ref 78.0–100.0)
Platelets: 233 10*3/uL (ref 150–400)
RBC: 3.79 MIL/uL — ABNORMAL LOW (ref 3.87–5.11)
RDW: 15.2 % (ref 11.5–15.5)
WBC: 11.1 10*3/uL — ABNORMAL HIGH (ref 4.0–10.5)

## 2017-08-28 LAB — GLUCOSE, CAPILLARY
GLUCOSE-CAPILLARY: 91 mg/dL (ref 65–99)
GLUCOSE-CAPILLARY: 91 mg/dL (ref 65–99)
Glucose-Capillary: 146 mg/dL — ABNORMAL HIGH (ref 65–99)

## 2017-08-28 LAB — RENAL FUNCTION PANEL
ANION GAP: 9 (ref 5–15)
Albumin: 3 g/dL — ABNORMAL LOW (ref 3.5–5.0)
BUN: 42 mg/dL — AB (ref 6–20)
CO2: 21 mmol/L — AB (ref 22–32)
Calcium: 8.5 mg/dL — ABNORMAL LOW (ref 8.9–10.3)
Chloride: 112 mmol/L — ABNORMAL HIGH (ref 101–111)
Creatinine, Ser: 3.93 mg/dL — ABNORMAL HIGH (ref 0.44–1.00)
GFR calc Af Amer: 14 mL/min — ABNORMAL LOW (ref >60)
GFR calc non Af Amer: 12 mL/min — ABNORMAL LOW (ref >60)
GLUCOSE: 87 mg/dL (ref 65–99)
POTASSIUM: 3.8 mmol/L (ref 3.5–5.1)
Phosphorus: 3.9 mg/dL (ref 2.5–4.6)
Sodium: 142 mmol/L (ref 135–145)

## 2017-08-28 LAB — MRSA PCR SCREENING: MRSA by PCR: NEGATIVE

## 2017-08-28 LAB — POCT ACTIVATED CLOTTING TIME
ACTIVATED CLOTTING TIME: 709 s
Activated Clotting Time: 279 seconds

## 2017-08-28 LAB — HEPARIN LEVEL (UNFRACTIONATED): Heparin Unfractionated: 0.58 IU/mL (ref 0.30–0.70)

## 2017-08-28 LAB — MAGNESIUM: MAGNESIUM: 1.9 mg/dL (ref 1.7–2.4)

## 2017-08-28 LAB — HIV ANTIBODY (ROUTINE TESTING W REFLEX): HIV Screen 4th Generation wRfx: NONREACTIVE

## 2017-08-28 SURGERY — LEFT HEART CATH AND CORONARY ANGIOGRAPHY
Anesthesia: LOCAL

## 2017-08-28 MED ORDER — SODIUM CHLORIDE 0.9 % IV SOLN
INTRAVENOUS | Status: AC
  Administered 2017-08-28: 19:00:00 via INTRAVENOUS

## 2017-08-28 MED ORDER — HYDRALAZINE HCL 20 MG/ML IJ SOLN: 5.0000 mg | INTRAMUSCULAR | Status: AC | PRN

## 2017-08-28 MED ORDER — HEPARIN (PORCINE) IN NACL 1000-0.9 UT/500ML-% IV SOLN
INTRAVENOUS | Status: AC
  Filled 2017-08-28: qty 1000

## 2017-08-28 MED ORDER — HEPARIN SODIUM (PORCINE) 1000 UNIT/ML IJ SOLN
INTRAMUSCULAR | Status: AC
  Filled 2017-08-28: qty 1

## 2017-08-28 MED ORDER — CLONIDINE HCL 0.1 MG PO TABS
0.1000 mg | ORAL_TABLET | Freq: Three times a day (TID) | ORAL | Status: DC
Start: 2017-08-28 — End: 2017-08-31
  Administered 2017-08-28 – 2017-08-31 (×9): 0.1 mg via ORAL
  Filled 2017-08-28 (×9): qty 1

## 2017-08-28 MED ORDER — SODIUM CHLORIDE 0.9% FLUSH: 3.0000 mL | INTRAVENOUS | Status: DC | PRN

## 2017-08-28 MED ORDER — TICAGRELOR 90 MG PO TABS
90.0000 mg | ORAL_TABLET | Freq: Two times a day (BID) | ORAL | Status: DC
  Administered 2017-08-28 – 2017-08-31 (×6): 90 mg via ORAL
  Filled 2017-08-28 (×6): qty 1

## 2017-08-28 MED ORDER — MIDAZOLAM HCL 2 MG/2ML IJ SOLN
INTRAMUSCULAR | Status: AC
  Filled 2017-08-28: qty 2

## 2017-08-28 MED ORDER — MIDAZOLAM HCL 2 MG/2ML IJ SOLN
INTRAMUSCULAR | Status: DC | PRN
  Administered 2017-08-28: 1 mg via INTRAVENOUS
  Administered 2017-08-28: 2 mg via INTRAVENOUS

## 2017-08-28 MED ORDER — TICAGRELOR 90 MG PO TABS
ORAL_TABLET | ORAL | Status: DC | PRN
  Administered 2017-08-28: 180 mg via ORAL

## 2017-08-28 MED ORDER — SODIUM CHLORIDE 0.9 % IV SOLN: 250.0000 mL | INTRAVENOUS | Status: DC | PRN

## 2017-08-28 MED ORDER — TICAGRELOR 90 MG PO TABS
ORAL_TABLET | ORAL | Status: AC
  Filled 2017-08-28: qty 2

## 2017-08-28 MED ORDER — PRAVASTATIN SODIUM 40 MG PO TABS
80.0000 mg | ORAL_TABLET | Freq: Every day | ORAL | Status: DC
Start: 2017-08-28 — End: 2017-08-31
  Administered 2017-08-28 – 2017-08-30 (×3): 80 mg via ORAL
  Filled 2017-08-28 (×3): qty 2

## 2017-08-28 MED ORDER — LIDOCAINE HCL (PF) 1 % IJ SOLN
INTRAMUSCULAR | Status: AC
  Filled 2017-08-28: qty 30

## 2017-08-28 MED ORDER — LABETALOL HCL 5 MG/ML IV SOLN: 10.0000 mg | INTRAVENOUS | Status: AC | PRN

## 2017-08-28 MED ORDER — IOHEXOL 350 MG/ML SOLN
INTRAVENOUS | Status: DC | PRN
  Administered 2017-08-28: 80 mL via INTRA_ARTERIAL

## 2017-08-28 MED ORDER — SODIUM CHLORIDE 0.9 % WEIGHT BASED INFUSION: 1.0000 mL/kg/h | INTRAVENOUS | Status: DC

## 2017-08-28 MED ORDER — FENTANYL CITRATE (PF) 100 MCG/2ML IJ SOLN
INTRAMUSCULAR | Status: DC | PRN
  Administered 2017-08-28 (×2): 25 ug via INTRAVENOUS

## 2017-08-28 MED ORDER — SODIUM CHLORIDE 0.9% FLUSH
3.0000 mL | Freq: Two times a day (BID) | INTRAVENOUS | Status: DC
  Administered 2017-08-29 – 2017-08-30 (×3): 3 mL via INTRAVENOUS

## 2017-08-28 MED ORDER — HEPARIN SODIUM (PORCINE) 1000 UNIT/ML IJ SOLN
INTRAMUSCULAR | Status: DC | PRN
  Administered 2017-08-28: 2000 [IU] via INTRAVENOUS
  Administered 2017-08-28: 10000 [IU] via INTRAVENOUS

## 2017-08-28 MED ORDER — HEPARIN (PORCINE) IN NACL 2-0.9 UNITS/ML
INTRAMUSCULAR | Status: AC | PRN
  Administered 2017-08-28 (×2): 500 mL

## 2017-08-28 MED ORDER — SODIUM CHLORIDE 0.9 % WEIGHT BASED INFUSION
3.0000 mL/kg/h | INTRAVENOUS | Status: DC
  Administered 2017-08-28: 3 mL/kg/h via INTRAVENOUS

## 2017-08-28 MED ORDER — FENTANYL CITRATE (PF) 100 MCG/2ML IJ SOLN
INTRAMUSCULAR | Status: AC
  Filled 2017-08-28: qty 2

## 2017-08-28 MED ORDER — LIDOCAINE HCL (PF) 1 % IJ SOLN
INTRAMUSCULAR | Status: DC | PRN
  Administered 2017-08-28: 16 mL via SUBCUTANEOUS

## 2017-08-28 SURGICAL SUPPLY — 18 items
BALLN SAPPHIRE 2.0X12 (BALLOONS) ×2
BALLN SAPPHIRE ~~LOC~~ 2.5X15 (BALLOONS) ×2 IMPLANT
BALLOON SAPPHIRE 2.0X12 (BALLOONS) ×1 IMPLANT
CATH INFINITI 5FR MULTPACK ANG (CATHETERS) ×2 IMPLANT
CATH VISTA GUIDE 6FR XB3 (CATHETERS) ×2 IMPLANT
COVER PRB 48X5XTLSCP FOLD TPE (BAG) ×1 IMPLANT
COVER PROBE 5X48 (BAG) ×1
KIT ENCORE 26 ADVANTAGE (KITS) ×2 IMPLANT
KIT HEART LEFT (KITS) ×2 IMPLANT
KIT MICROPUNCTURE NIT STIFF (SHEATH) ×2 IMPLANT
PACK CARDIAC CATHETERIZATION (CUSTOM PROCEDURE TRAY) ×2 IMPLANT
SHEATH PINNACLE 5F 10CM (SHEATH) ×2 IMPLANT
SHEATH PINNACLE 6F 10CM (SHEATH) ×2 IMPLANT
STENT SYNERGY DES 2.25X20 (Permanent Stent) ×2 IMPLANT
TRANSDUCER W/STOPCOCK (MISCELLANEOUS) ×2 IMPLANT
TUBING CIL FLEX 10 FLL-RA (TUBING) ×2 IMPLANT
WIRE COUGAR XT STRL 190CM (WIRE) ×2 IMPLANT
WIRE EMERALD 3MM-J .035X150CM (WIRE) ×2 IMPLANT

## 2017-08-28 NOTE — Progress Notes (Signed)
Assessment/Plan: 1.  NSTEMI- with rising troponins.  s/p cath, PTA with plans for a staged procedure-Thursday(per pt) 2. CKD stage 5- followed at [facility]. Hold lasix as well for now as well as ARB. Given IVF 3. HTN-suboptimal off ARB, since ARB/diuretic on hold clonidine added in hospital 4. Lupus- stable on Cellcept/steroids 5. DM- pt with elevated glucose which may be a new diagnosis.  Per primary svc  Subjective: Interval History: none.  Objective: Vital signs in last 24 hours: Temp:  [98.3 F (36.8 C)-99.3 F (37.4 C)] 98.6 F (37 C) (05/28 1500) Pulse Rate:  [34-126] 77 (05/28 1500) Resp:  [0-30] 12 (05/28 1500) BP: (126-173)/(89-116) 167/99 (05/28 1500) SpO2:  [0 %-100 %] 100 % (05/28 1500) Weight:  [76.1 kg (167 lb 12.8 oz)] 76.1 kg (167 lb 12.8 oz) (05/27 2046) Weight change: -0.998 kg (-2 lb 3.2 oz)  Intake/Output from previous day: 05/27 0701 - 05/28 0700 In: 205.4 [I.V.:205.4] Out: -  Intake/Output this shift: No intake/output data recorded.  Chest wall: no tenderness Cardio: regular rate and rhythm, S1, S2 normal, no murmur, click, rub or gallop GI: soft, non-tender; bowel sounds normal; no masses,  no organomegaly Extremities: extremities normal, atraumatic, no cyanosis or edema  Lab Results: Recent Labs    08/26/17 2108 08/28/17 0653  WBC 10.5 11.1*  HGB 11.8* 10.7*  HCT 37.6 36.2  PLT 261 233   BMET:  Recent Labs    08/26/17 2108 08/28/17 0653  NA 141 142  K 4.6 3.8  CL 112* 112*  CO2 18* 21*  GLUCOSE 166* 87  BUN 57* 42*  CREATININE 3.90* 3.93*  CALCIUM 8.4* 8.5*   No results for input(s): PTH in the last 72 hours. Iron Studies: No results for input(s): IRON, TIBC, TRANSFERRIN, FERRITIN in the last 72 hours. Studies/Results: Dg Chest 2 View  Result Date: 08/26/2017 CLINICAL DATA:  Chest pain EXAM: CHEST - 2 VIEW COMPARISON:  July 30, 2016 FINDINGS: There is no edema or consolidation. Heart is borderline enlarged with pulmonary  vascularity normal. No adenopathy. No bone lesions. IMPRESSION: Heart borderline enlarged.  No edema or consolidation. Electronically Signed   By: Lowella Grip III M.D.   On: 08/26/2017 21:45    Scheduled: . allopurinol  100 mg Oral Daily  . amLODipine  10 mg Oral Daily  . aspirin  81 mg Oral Daily  . carvedilol  6.25 mg Oral BID WC  . cloNIDine  0.1 mg Oral TID  . famotidine  20 mg Oral Daily  . insulin aspart  0-9 Units Subcutaneous Q4H  . mycophenolate  500 mg Oral Daily   And  . mycophenolate  250 mg Oral QHS  . pantoprazole  40 mg Oral Daily  . pravastatin  80 mg Oral q1800  . predniSONE  6 mg Oral Q breakfast  . sodium chloride flush  3 mL Intravenous Q12H  . ticagrelor  90 mg Oral BID    LOS: 1 day   Estanislado Emms 08/28/2017,3:23 PM

## 2017-08-28 NOTE — Progress Notes (Signed)
Site area: right groin  Site Prior to Removal:  Level 0  Pressure Applied For 20 MINUTES    Minutes Beginning at 1555  Manual:   Yes.    Patient Status During Pull:  Stable   Post Pull Groin Site:  Level 0  Post Pull Instructions Given:  Yes.    Post Pull Pulses Present:  Yes.    Dressing Applied:  Yes.    Comments:  Rechecked site frequently with no change in assessment, dressing remains dry and intact.

## 2017-08-28 NOTE — Progress Notes (Signed)
Progress Note  Patient Name: Lindsey York Date of Encounter: 08/28/2017  Primary Cardiologist: No primary care provider on file.  Subjective   No chest pain this morning. Planned for cath today.   Inpatient Medications    Scheduled Meds: . allopurinol  100 mg Oral Daily  . amLODipine  10 mg Oral Daily  . aspirin  81 mg Oral Daily  . carvedilol  6.25 mg Oral BID WC  . famotidine  20 mg Oral Daily  . insulin aspart  0-9 Units Subcutaneous Q4H  . lovastatin  80 mg Oral q1800  . mycophenolate  500 mg Oral Daily   And  . mycophenolate  250 mg Oral QHS  . pantoprazole  40 mg Oral Daily  . predniSONE  6 mg Oral Q breakfast   Continuous Infusions: . heparin 800 Units/hr (08/28/17 0132)   PRN Meds: acetaminophen, hydrALAZINE, ondansetron (ZOFRAN) IV   Vital Signs    Vitals:   08/27/17 2046 08/27/17 2343 08/28/17 0542 08/28/17 0734  BP: (!) 158/94 (!) 145/96 (!) 135/96 (!) 130/93  Pulse: 67 85 82 79  Resp: 12 10 12    Temp: 98.5 F (36.9 C) 98.6 F (37 C) 99.3 F (37.4 C) 98.3 F (36.8 C)  TempSrc: Oral Oral Oral Oral  SpO2: 100% 100% 99% 100%  Weight: 167 lb 12.8 oz (76.1 kg)     Height: 5\' 3"  (1.6 m)       Intake/Output Summary (Last 24 hours) at 08/28/2017 0745 Last data filed at 08/27/2017 2359 Gross per 24 hour  Intake 205.4 ml  Output -  Net 205.4 ml   Filed Weights   08/26/17 2003 08/27/17 2046  Weight: 170 lb (77.1 kg) 167 lb 12.8 oz (76.1 kg)    Telemetry    SR - Personally Reviewed  ECG    SR - Personally Reviewed  Physical Exam   General: Well developed, well nourished, female appearing in no acute distress. Head: Normocephalic, atraumatic.  Neck: Supple without bruits, JVD. Lungs:  Resp regular and unlabored, CTA. Heart: RRR, S1, S2, no murmur; no rub. Abdomen: Soft, non-tender, non-distended with normoactive bowel sounds.  Extremities: No clubbing, cyanosis, edema. Distal pedal pulses are 2+ bilaterally. Neuro: Alert and oriented  X 3. Moves all extremities spontaneously. Psych: Normal affect.  Labs    Chemistry Recent Labs  Lab 08/26/17 2108  NA 141  K 4.6  CL 112*  CO2 18*  GLUCOSE 166*  BUN 57*  CREATININE 3.90*  CALCIUM 8.4*  PROT 7.4  ALBUMIN 3.8  AST 14*  ALT 11*  ALKPHOS 98  BILITOT 0.2*  GFRNONAA 12*  GFRAA 14*  ANIONGAP 11     Hematology Recent Labs  Lab 08/26/17 2108  WBC 10.5  RBC 3.96  HGB 11.8*  HCT 37.6  MCV 94.9  MCH 29.8  MCHC 31.4  RDW 15.1  PLT 261    Cardiac Enzymes Recent Labs  Lab 08/26/17 2108 08/27/17 0027 08/27/17 0435 08/27/17 0938  TROPONINI 0.07* 0.73* 11.15* 31.83*   No results for input(s): TROPIPOC in the last 168 hours.   BNPNo results for input(s): BNP, PROBNP in the last 168 hours.   DDimer No results for input(s): DDIMER in the last 168 hours.    Radiology    Dg Chest 2 View  Result Date: 08/26/2017 CLINICAL DATA:  Chest pain EXAM: CHEST - 2 VIEW COMPARISON:  July 30, 2016 FINDINGS: There is no edema or consolidation. Heart is borderline enlarged with pulmonary vascularity normal.  No adenopathy. No bone lesions. IMPRESSION: Heart borderline enlarged.  No edema or consolidation. Electronically Signed   By: Lowella Grip III M.D.   On: 08/26/2017 21:45    Cardiac Studies   N/a   Patient Profile     52 y.o. female with history of lupus, CKD 4 on renal transplant list at [facility] and essential hypertension admitted with right arm and chest pain, and non-STEMI with peak troponin so far of 31 on IV heparin.  Assessment & Plan    1: Non-STEMI- EKG shows nonspecific ST and T wave changes. Troponin elevated to 31.  Her pain is fairly recent in onset beginning just several days ago. Had a recurrence of chest pain yesterday afternoon while waiting in the ED. No chest pain this morning, on IV heparin.  She will need diagnostic coronary angiography using limited contrast given her CKD 4.  She is on the renal  transplant list at [facility]. Seen by nephrology and risks/benefits of cath discussed and possibility of dialysis. She understands and is willing to proceed. A 2D echocardiogram is pending for LV function to help limit contrast exposure.    2: Essential hypertension- blood pressure controlled on amlodipine, clonidine, and carvedilol. Losartan was stopped yesterday.   3: Hyperlipidemia- on statin therapy  4: CKD 4- history of lupus on the renal transplant list at [facility]. She was seen by nephrology this admission with the risks/benefits of cath with contrast discussed. She understands the risk of dialysis and is willing to proceed with cath.  Cr pending this morning. Last reading was 3.90 on 5/26.    Signed, Reino Bellis, NP  08/28/2017, 7:45 AM  Pager # [phone]   For questions or updates, please contact [place] Please consult [email or web address] for contact info under Cardiology/STEMI.

## 2017-08-28 NOTE — Progress Notes (Signed)
#  9.  S/W FAY  @ [employer] # [phone] OPT- 2    BRILINTA 90 MG BID  COVER- YES  CO-PAY- $ 47.00  TIER- 3 DRUG  PRIOR APPROVAL- NO   DEDUCTIBLE : HAS BEEN MET   PREFERRED PHARMACY :  YES- [facility]

## 2017-08-28 NOTE — Interval H&P Note (Signed)
History and Physical Interval Note:  4/68/0321 2:24 AM  Lindsey York  has presented today for cardiac cath with the diagnosis of NSTEMI  The various methods of treatment have been discussed with the patient and family. After consideration of risks, benefits and other options for treatment, the patient has consented to  Procedure(s): LEFT HEART CATH AND CORONARY ANGIOGRAPHY (N/A) as a surgical intervention .  The patient's history has been reviewed, patient examined, no change in status, stable for surgery.  I have reviewed the patient's chart and labs.  Questions were answered to the patient's satisfaction.    Cath Lab Visit (complete for each Cath Lab visit)  Clinical Evaluation Leading to the Procedure:   ACS: Yes.    Non-ACS:    Anginal Classification: CCS IV  Anti-ischemic medical therapy: Minimal Therapy (1 class of medications)  Non-Invasive Test Results: No non-invasive testing performed  Prior CABG: No previous CABG         Lindsey York

## 2017-08-28 NOTE — Progress Notes (Signed)
2 phlebotomists attempted blood draw for heparin level, but were unsuccessful.  Pharmacist and Forrest Moron, NP notified.  Phlebotomy will reattempt blood draw in am.  Jodell Cipro

## 2017-08-28 NOTE — Plan of Care (Signed)
  Problem: Activity: Goal: Ability to tolerate increased activity will improve Outcome: Progressing Note:  Ambulates to bathroom with standby assist without difficulty.   Problem: Clinical Measurements: Goal: Will remain free from infection Outcome: Progressing Note:  No s/s of infection noted. Goal: Respiratory complications will improve Outcome: Progressing Note:  No s/s of respiratory complications.   Problem: Pain Managment: Goal: General experience of comfort will improve Outcome: Progressing Note:  Chest pain improved with NTG gtt.

## 2017-08-28 NOTE — Progress Notes (Addendum)
@IPLOG @        PROGRESS NOTE                                                                                                                                                                                                             Patient Demographics:    Lindsey York, is a 52 y.o. female, DOB - October 23, 1965, JME:[id number]  Admit date - 08/26/2017   Admitting Physician Vianne Bulls, MD  Outpatient Primary MD for the patient is Cyndie Chime, MD  LOS - 1  Chief Complaint  Patient presents with  . Chest Pain       Brief Narrative  See H&P for all details in brief, [name] with history of lupus, hypertension, CKD stage V who takes CellCept and prednisone for her lupus and follows with the nephrology department at [facility] comes in with 2 to 3-day history of right arm discomfort along with some substernal pressure that developed the night of admission.  In the ER her work-up was significant for elevated troponin, she was placed on heparin drip, cardiology was consulted and she was admitted for NSTEMI.  Patient and family requested [facility] transfer if bed available as most of the physicians at their, cardiology is involved here as well.     Subjective:    Lindsey York today has, No headache, No chest pain, No abdominal pain - No Nausea, No new weakness tingling or numbness, No Cough - SOB.     Assessment  & Plan :     1. NSTEMI -now chest pain-free and placed on nitroglycerin drip per cardiology, he was seen by cardiology, we placed her on appropriate medical care which included aspirin, Coreg, high intensity statin along with heparin drip, patient initially wanted to be transferred to [facility] and this was arranged however there are no open beds at [facility], at this point she is scheduled for left heart cath here later today and patient now is agreeable to staying here and wants to cancel her transfer.  We will proceed with left heart cath, further  management per cardiology.  She is currently pain-free and appears stable.  2.  History of lupus causing CKD stage V.  Currently at baseline.  Continue home medications which include CellCept and prednisone, baseline creatinine is around 3.9 and she is close to baseline.  Labs reviewed from [facility].  Renal also requested to be on board.  3.  Hypertension.  Currently on combination of Coreg and nitroglycerin drip, Norvasc and ARB currently on hold.  As  needed hydralazine added will monitor.  4.  Dyslipidemia.  On statin.  5.  History of gout.  Continue allopurinol.      Diet :  Diet Order           Diet NPO time specified  Diet effective now           Family Communication  :  Family bedside  Code Status :  Full  Disposition Plan  :  Inpt stay  Consults  :  Cards, Renal  Procedures  :    L Heart Cath later today  DVT Prophylaxis  :   Heparin    Lab Results  Component Value Date   PLT 233 08/28/2017    Inpatient Medications  Scheduled Meds: . [MAR Hold] allopurinol  100 mg Oral Daily  . [MAR Hold] amLODipine  10 mg Oral Daily  . [MAR Hold] aspirin  81 mg Oral Daily  . [MAR Hold] carvedilol  6.25 mg Oral BID WC  . [MAR Hold] famotidine  20 mg Oral Daily  . [MAR Hold] insulin aspart  0-9 Units Subcutaneous Q4H  . [MAR Hold] lovastatin  80 mg Oral q1800  . [MAR Hold] mycophenolate  500 mg Oral Daily   And  . [MAR Hold] mycophenolate  250 mg Oral QHS  . [MAR Hold] pantoprazole  40 mg Oral Daily  . [MAR Hold] predniSONE  6 mg Oral Q breakfast   Continuous Infusions: . sodium chloride 3 mL/kg/hr (08/28/17 0834)   Followed by  . sodium chloride    . heparin 800 Units/hr (08/28/17 0132)  . heparin     PRN Meds:.[MAR Hold] acetaminophen, fentaNYL, heparin, heparin, [MAR Hold] hydrALAZINE, lidocaine (PF), midazolam, [MAR Hold] ondansetron (ZOFRAN) IV  Antibiotics  :    Anti-infectives (From admission, onward)   None         Objective:   Vitals:    08/27/17 2343 08/28/17 0542 08/28/17 0734 08/28/17 0852  BP: (!) 145/96 (!) 135/96 (!) 130/93   Pulse: 85 82 79   Resp: 10 12    Temp: 98.6 F (37 C) 99.3 F (37.4 C) 98.3 F (36.8 C)   TempSrc: Oral Oral Oral   SpO2: 100% 99% 100% 98%  Weight:      Height:        Wt Readings from Last 3 Encounters:  08/27/17 76.1 kg (167 lb 12.8 oz)  07/30/16 78.9 kg (174 lb)  03/24/14 89.4 kg (197 lb)     Intake/Output Summary (Last 24 hours) at 08/28/2017 0925 Last data filed at 08/27/2017 2359 Gross per 24 hour  Intake 205.4 ml  Output -  Net 205.4 ml     Physical Exam  Awake Alert, Oriented X 3, No new F.N deficits, Normal affect [place].AT,PERRAL Supple Neck,No JVD, No cervical lymphadenopathy appriciated.  Symmetrical Chest wall movement, Good air movement bilaterally, CTAB RRR,No Gallops,Rubs or new Murmurs, No Parasternal Heave +ve B.Sounds, Abd Soft, No tenderness, No organomegaly appriciated, No rebound - guarding or rigidity. No Cyanosis, Clubbing or edema, No new Rash or bruise     Data Review:    CBC Recent Labs  Lab 08/26/17 2108 08/28/17 0653  WBC 10.5 11.1*  HGB 11.8* 10.7*  HCT 37.6 36.2  PLT 261 233  MCV 94.9 95.5  MCH 29.8 28.2  MCHC 31.4 29.6*  RDW 15.1 15.2    Chemistries  Recent Labs  Lab 08/26/17 2108 08/28/17 0653  NA 141 142  K 4.6 3.8  CL 112* 112*  CO2 18* 21*  GLUCOSE 166* 87  BUN 57* 42*  CREATININE 3.90* 3.93*  CALCIUM 8.4* 8.5*  MG  --  1.9  AST 14*  --   ALT 11*  --   ALKPHOS 98  --   BILITOT 0.2*  --    ------------------------------------------------------------------------------------------------------------------ No results for input(s): CHOL, HDL, LDLCALC, TRIG, CHOLHDL, LDLDIRECT in the last 72 hours.  No results found for: HGBA1C ------------------------------------------------------------------------------------------------------------------ No results for input(s): TSH, T4TOTAL, T3FREE, THYROIDAB in the last 72  hours.  Invalid input(s): FREET3 ------------------------------------------------------------------------------------------------------------------ No results for input(s): VITAMINB12, FOLATE, FERRITIN, TIBC, IRON, RETICCTPCT in the last 72 hours.  Coagulation profile No results for input(s): INR, PROTIME in the last 168 hours.  No results for input(s): DDIMER in the last 72 hours.  Cardiac Enzymes Recent Labs  Lab 08/26/17 2108 08/27/17 0027 08/27/17 0435 08/27/17 0938  CKMB 1.7  --   --   --   TROPONINI 0.07* 0.73* 11.15* 31.83*   ------------------------------------------------------------------------------------------------------------------ No results found for: BNP  Micro Results Recent Results (from the past 240 hour(s))  MRSA PCR Screening     Status: None   Collection Time: 08/28/17  5:40 AM  Result Value Ref Range Status   MRSA by PCR NEGATIVE NEGATIVE Final    Comment:        The GeneXpert MRSA Assay (FDA approved for NASAL specimens only), is one component of a comprehensive MRSA colonization surveillance program. It is not intended to diagnose MRSA infection nor to guide or monitor treatment for MRSA infections. Performed at [facility], [place] [address], [place], [place] [address]     Radiology Reports Dg Chest 2 View  Result Date: 08/26/2017 CLINICAL DATA:  Chest pain EXAM: CHEST - 2 VIEW COMPARISON:  July 30, 2016 FINDINGS: There is no edema or consolidation. Heart is borderline enlarged with pulmonary vascularity normal. No adenopathy. No bone lesions. IMPRESSION: Heart borderline enlarged.  No edema or consolidation. Electronically Signed   By: Lowella Grip III M.D.   On: 08/26/2017 21:45    Time Spent in minutes  30   Lala Lund M.D on 08/28/2017 at 9:25 AM  Between 7am to 7pm - Pager - [phone] ( page via [email or web address], text pages only, please mention full 10 digit call back number). After 7pm go to [email or web address] - password  [facility]

## 2017-08-29 ENCOUNTER — Inpatient Hospital Stay (HOSPITAL_COMMUNITY): Payer: Medicare HMO

## 2017-08-29 DIAGNOSIS — N185 Chronic kidney disease, stage 5: Secondary | ICD-10-CM

## 2017-08-29 DIAGNOSIS — R079 Chest pain, unspecified: Secondary | ICD-10-CM

## 2017-08-29 DIAGNOSIS — M329 Systemic lupus erythematosus, unspecified: Secondary | ICD-10-CM

## 2017-08-29 LAB — RENAL FUNCTION PANEL
ANION GAP: 7 (ref 5–15)
Albumin: 2.4 g/dL — ABNORMAL LOW (ref 3.5–5.0)
BUN: 41 mg/dL — ABNORMAL HIGH (ref 6–20)
CO2: 21 mmol/L — ABNORMAL LOW (ref 22–32)
Calcium: 8.1 mg/dL — ABNORMAL LOW (ref 8.9–10.3)
Chloride: 114 mmol/L — ABNORMAL HIGH (ref 101–111)
Creatinine, Ser: 3.98 mg/dL — ABNORMAL HIGH (ref 0.44–1.00)
GFR calc Af Amer: 14 mL/min — ABNORMAL LOW (ref >60)
GFR, EST NON AFRICAN AMERICAN: 12 mL/min — AB (ref >60)
Glucose, Bld: 119 mg/dL — ABNORMAL HIGH (ref 65–99)
PHOSPHORUS: 3.7 mg/dL (ref 2.5–4.6)
POTASSIUM: 4.1 mmol/L (ref 3.5–5.1)
Sodium: 142 mmol/L (ref 135–145)

## 2017-08-29 LAB — ECHOCARDIOGRAM COMPLETE
Height: 63 in
Weight: 2705.49 oz

## 2017-08-29 LAB — POCT ACTIVATED CLOTTING TIME
ACTIVATED CLOTTING TIME: 191 s
ACTIVATED CLOTTING TIME: 164 s
Activated Clotting Time: 224 seconds

## 2017-08-29 LAB — CBC
HEMATOCRIT: 33.5 % — AB (ref 36.0–46.0)
HEMOGLOBIN: 9.9 g/dL — AB (ref 12.0–15.0)
MCH: 28.4 pg (ref 26.0–34.0)
MCHC: 29.6 g/dL — ABNORMAL LOW (ref 30.0–36.0)
MCV: 96.3 fL (ref 78.0–100.0)
Platelets: 197 10*3/uL (ref 150–400)
RBC: 3.48 MIL/uL — ABNORMAL LOW (ref 3.87–5.11)
RDW: 15.4 % (ref 11.5–15.5)
WBC: 9.5 10*3/uL (ref 4.0–10.5)

## 2017-08-29 LAB — GLUCOSE, CAPILLARY: Glucose-Capillary: 147 mg/dL — ABNORMAL HIGH (ref 65–99)

## 2017-08-29 MED ORDER — SODIUM CHLORIDE 0.9 % WEIGHT BASED INFUSION: 1.0000 mL/kg/h | INTRAVENOUS | Status: DC

## 2017-08-29 MED ORDER — SODIUM CHLORIDE 0.9% FLUSH: 3.0000 mL | INTRAVENOUS | Status: DC | PRN

## 2017-08-29 MED ORDER — SODIUM CHLORIDE 0.9 % IV SOLN
250.0000 mL | INTRAVENOUS | Status: DC | PRN
Start: 2017-08-29 — End: 2017-08-31

## 2017-08-29 MED ORDER — SODIUM CHLORIDE 0.9% FLUSH
3.0000 mL | Freq: Two times a day (BID) | INTRAVENOUS | Status: DC
  Administered 2017-08-29: 3 mL via INTRAVENOUS

## 2017-08-29 MED ORDER — SODIUM CHLORIDE 0.9 % WEIGHT BASED INFUSION: 3.0000 mL/kg/h | INTRAVENOUS | Status: DC

## 2017-08-29 MED ORDER — SENNA 8.6 MG PO TABS
2.0000 | ORAL_TABLET | Freq: Every day | ORAL | Status: DC | PRN
  Filled 2017-08-29: qty 2

## 2017-08-29 MED FILL — Heparin Sod (Porcine)-NaCl IV Soln 1000 Unit/500ML-0.9%: INTRAVENOUS | Qty: 1000 | Status: AC

## 2017-08-29 NOTE — Progress Notes (Signed)
@IPLOG @        PROGRESS NOTE                                                                                                                                                                                                             Lindsey York Demographics:    Lindsey York, is a 52 y.o. female, DOB - [birth year], BZM:[id number]  Admit date - 08/26/2017   Admitting Physician Vianne Bulls, MD  Outpatient Primary MD for the Lindsey York is Lindsey York, No Pcp Per  LOS - 2  Chief Complaint  Lindsey York presents with  . Chest Pain       Brief Narrative  See H&P for all details in brief, [name] with history of lupus, hypertension, CKD stage V who takes CellCept and prednisone for her lupus and follows with the nephrology department at [facility] comes in with 2 to 3-day history of right arm discomfort along with some substernal pressure that developed the night of admission.  In the ER her work-up was significant for elevated troponin, she was placed on heparin drip, cardiology was consulted and she was admitted for NSTEMI.    Lindsey York and family initially requested transfer to [facility] but change their mind.     Subjective:    Lindsey York denies chest pain or any other complaints.  Reports constipation and asking for medications for that.  Last BM 3 days ago.  No abdominal pain, nausea or vomiting.  As per RN, no acute issues reported.   Assessment  & Plan :     1. NSTEMI - was seen by cardiology, placed her on appropriate medical care which included aspirin, Coreg, high intensity statin along with heparin drip, Lindsey York initially wanted to be transferred to [facility] and this was arranged however there are no open beds at [facility] and Lindsey York agreed to continue care at [facility].  Underwent cardiac cath 5/28 with successful PCI/DES x1-2nd OM.  She has residual disease in the RCA with plans for staged intervention provided renal functions remained stable.  Started  on DAPT with aspirin and Brilinta post-cath.  Cardiology following.  No further chest pain.  Plans for IV hydration and possible PCI 5/30.  2.  History of lupus causing CKD stage V.  Currently at baseline.  Continue home medications which include CellCept and prednisone, baseline creatinine is around 3.9 and she is close to baseline.  Labs reviewed from [facility].  Nephrology input from 5/28 appreciated.  Continue to hold Lasix and ARB.  Giving IV fluids.  Creatinine remains in the 3.9 range for the last 3 days.  3.  Hypertension.  Currently on combination of amlodipine, carvedilol and clonidine.  ARB on hold due to concern for worsening renal insufficiency.  As needed hydralazine added will monitor.  Controlled.  4.  Dyslipidemia.  On statin.  5.  History of gout.  Continue allopurinol.  6.  Lupus: Stable on CellCept and steroids.  7.  Hyperglycemia: Continue SSI.  Check A1c.  8.  Constipation: Initiated bowel regimen.      Diet :  Diet Order           Diet NPO time specified Except for: Sips with Meds  Diet effective midnight        Diet Heart Room service appropriate? Yes; Fluid consistency: Thin  Diet effective now           Family Communication  : None at bedside  Code Status :  Full  Disposition Plan  :  Inpt stay  Consults  :  Cards, Renal  Procedures  :    L Heart Cath and PCI 5/28  DVT Prophylaxis  :   Heparin    Lab Results  Component Value Date   PLT 197 08/29/2017    Inpatient Medications  Scheduled Meds: . allopurinol  100 mg Oral Daily  . amLODipine  10 mg Oral Daily  . aspirin  81 mg Oral Daily  . carvedilol  6.25 mg Oral BID WC  . cloNIDine  0.1 mg Oral TID  . famotidine  20 mg Oral Daily  . insulin aspart  0-9 Units Subcutaneous Q4H  . mycophenolate  500 mg Oral Daily   And  . mycophenolate  250 mg Oral QHS  . pantoprazole  40 mg Oral Daily  . pravastatin  80 mg Oral q1800  . predniSONE  6 mg Oral Q breakfast  . sodium chloride flush   3 mL Intravenous Q12H  . sodium chloride flush  3 mL Intravenous Q12H  . ticagrelor  90 mg Oral BID   Continuous Infusions: . sodium chloride    . sodium chloride    . [START ON 08/30/2017] sodium chloride     Followed by  . [START ON 08/30/2017] sodium chloride     PRN Meds:.sodium chloride, sodium chloride, acetaminophen, hydrALAZINE, ondansetron (ZOFRAN) IV, senna, sodium chloride flush, sodium chloride flush  Antibiotics  :    Anti-infectives (From admission, onward)   None         Objective:   Vitals:   08/29/17 0609 08/29/17 0758 08/29/17 1050 08/29/17 1607  BP: (!) 143/93 100/78 129/84 116/83  Pulse: 75 87 66 69  Resp: 15 14 12 11   Temp: 98.6 F (37 C) 98.2 F (36.8 C) 97.9 F (36.6 C) (!) 97.4 F (36.3 C)  TempSrc: Oral     SpO2: 98% 100% 100% 100%  Weight: 76.7 kg (169 lb 1.5 oz)     Height:        Wt Readings from Last 3 Encounters:  08/29/17 76.7 kg (169 lb 1.5 oz)  07/30/16 78.9 kg (174 lb)  03/24/14 89.4 kg (197 lb)     Intake/Output Summary (Last 24 hours) at 08/29/2017 1909 Last data filed at 08/29/2017 1300 Gross per 24 hour  Intake 480 ml  Output -  Net 480 ml     Physical Exam  General exam: Pleasant young female, moderately built and nourished, lying comfortably propped up in bed. RS: Clear to auscultation.  No increased work  of breathing. CVS: S1 and S2 heard, RRR.  No JVD, murmurs or pedal edema.  Telemetry: Sinus rhythm. GI: Abdomen is nondistended, soft and nontender.  Normal bowel sounds heard. CNS: Alert and oriented.  No focal neurological deficits. Extremities: Symmetric 5 x 5 power.    Data Review:    CBC Recent Labs  Lab 08/26/17 2108 08/28/17 0653 08/29/17 0040  WBC 10.5 11.1* 9.5  HGB 11.8* 10.7* 9.9*  HCT 37.6 36.2 33.5*  PLT 261 233 197  MCV 94.9 95.5 96.3  MCH 29.8 28.2 28.4  MCHC 31.4 29.6* 29.6*  RDW 15.1 15.2 15.4    Chemistries  Recent Labs  Lab 08/26/17 2108 08/28/17 0653 08/29/17 0040  NA 141  142 142  K 4.6 3.8 4.1  CL 112* 112* 114*  CO2 18* 21* 21*  GLUCOSE 166* 87 119*  BUN 57* 42* 41*  CREATININE 3.90* 3.93* 3.98*  CALCIUM 8.4* 8.5* 8.1*  MG  --  1.9  --   AST 14*  --   --   ALT 11*  --   --   ALKPHOS 98  --   --   BILITOT 0.2*  --   --      Cardiac Enzymes Recent Labs  Lab 08/26/17 2108 08/27/17 0027 08/27/17 0435 08/27/17 0938  CKMB 1.7  --   --   --   TROPONINI 0.07* 0.73* 11.15* 31.83*   ------------------------------------------------------------------------------------------------------------------ No results found for: BNP  Micro Results Recent Results (from the past 240 hour(s))  MRSA PCR Screening     Status: None   Collection Time: 08/28/17  5:40 AM  Result Value Ref Range Status   MRSA by PCR NEGATIVE NEGATIVE Final    Comment:        The GeneXpert MRSA Assay (FDA approved for NASAL specimens only), is one component of a comprehensive MRSA colonization surveillance program. It is not intended to diagnose MRSA infection nor to guide or monitor treatment for MRSA infections. Performed at [facility], [place] [address] [place], [place] [address]     Radiology Reports Dg Chest 2 View  Result Date: 08/26/2017 CLINICAL DATA:  Chest pain EXAM: CHEST - 2 VIEW COMPARISON:  July 30, 2016 FINDINGS: There is no edema or consolidation. Heart is borderline enlarged with pulmonary vascularity normal. No adenopathy. No bone lesions. IMPRESSION: Heart borderline enlarged.  No edema or consolidation. Electronically Signed   By: Lowella Grip III M.D.   On: 08/26/2017 21:45    Vernell Leep, MD, FACP, [facility]. [facility] Pager [phone]  If 7PM-7AM, please contact night-coverage [email or web address] Password [facility] 08/29/2017, 7:17 PM

## 2017-08-29 NOTE — Plan of Care (Signed)
  Problem: Education: Goal: Understanding of cardiac disease, CV risk reduction, and recovery process will improve Outcome: Progressing   Problem: Activity: Goal: Ability to tolerate increased activity will improve Outcome: Progressing   Problem: Cardiac: Goal: Ability to achieve and maintain adequate cardiovascular perfusion will improve Outcome: Progressing   Problem: Health Behavior/Discharge Planning: Goal: Ability to safely manage health-related needs after discharge will improve Outcome: Progressing   Problem: Education: Goal: Knowledge of General Education information will improve Outcome: Progressing   Problem: Health Behavior/Discharge Planning: Goal: Ability to manage health-related needs will improve Outcome: Progressing   Problem: Clinical Measurements: Goal: Ability to maintain clinical measurements within normal limits will improve Outcome: Progressing Goal: Will remain free from infection Outcome: Progressing Goal: Diagnostic test results will improve Outcome: Progressing Goal: Respiratory complications will improve Outcome: Progressing Goal: Cardiovascular complication will be avoided Outcome: Progressing   Problem: Pain Managment: Goal: General experience of comfort will improve Outcome: Progressing   Problem: Safety: Goal: Ability to remain free from injury will improve Outcome: Progressing   Problem: Education: Goal: Understanding of CV disease, CV risk reduction, and recovery process will improve Outcome: Progressing   Problem: Activity: Goal: Ability to return to baseline activity level will improve Outcome: Progressing   Problem: Cardiovascular: Goal: Ability to achieve and maintain adequate cardiovascular perfusion will improve Outcome: Progressing Goal: Vascular access site(s) Level 0-1 will be maintained Outcome: Progressing   Problem: Health Behavior/Discharge Planning: Goal: Ability to safely manage health-related needs after  discharge will improve Outcome: Progressing

## 2017-08-29 NOTE — Progress Notes (Addendum)
Progress Note  Patient Name: Lindsey York Date of Encounter: 08/29/2017  Primary Cardiologist: No primary care provider on file.  Subjective   Feeling well this morning. No chest pain.   Inpatient Medications    Scheduled Meds: . allopurinol  100 mg Oral Daily  . amLODipine  10 mg Oral Daily  . aspirin  81 mg Oral Daily  . carvedilol  6.25 mg Oral BID WC  . cloNIDine  0.1 mg Oral TID  . famotidine  20 mg Oral Daily  . insulin aspart  0-9 Units Subcutaneous Q4H  . mycophenolate  500 mg Oral Daily   And  . mycophenolate  250 mg Oral QHS  . pantoprazole  40 mg Oral Daily  . pravastatin  80 mg Oral q1800  . predniSONE  6 mg Oral Q breakfast  . sodium chloride flush  3 mL Intravenous Q12H  . ticagrelor  90 mg Oral BID   Continuous Infusions: . sodium chloride     PRN Meds: sodium chloride, acetaminophen, hydrALAZINE, ondansetron (ZOFRAN) IV, sodium chloride flush   Vital Signs    Vitals:   08/28/17 1937 08/28/17 2000 08/29/17 0609 08/29/17 0758  BP: (!) 126/96 (!) 136/91 (!) 143/93 100/78  Pulse: 92 89 75 87  Resp: 13 17 15 14   Temp:  98.6 F (37 C) 98.6 F (37 C) 98.2 F (36.8 C)  TempSrc:  Oral Oral   SpO2: 100% 100% 98% 100%  Weight:   169 lb 1.5 oz (76.7 kg)   Height:        Intake/Output Summary (Last 24 hours) at 08/29/2017 0843 Last data filed at 08/28/2017 1900 Gross per 24 hour  Intake 896.25 ml  Output 450 ml  Net 446.25 ml   Filed Weights   08/26/17 2003 08/27/17 2046 08/29/17 0609  Weight: 170 lb (77.1 kg) 167 lb 12.8 oz (76.1 kg) 169 lb 1.5 oz (76.7 kg)    Telemetry    SR - Personally Reviewed  ECG    SR - Personally Reviewed  Physical Exam   General: Well developed, well nourished, female appearing in no acute distress. Head: Normocephalic, atraumatic.  Neck: Supple without bruits, JVD. Lungs:  Resp regular and unlabored, CTA. Heart: RRR, S1, S2, soft systolic murmur; no rub. Abdomen: Soft, non-tender, non-distended with  normoactive bowel sounds. Extremities: No clubbing, cyanosis, edema. Distal pedal pulses are 2+ bilaterally. Right femoral cath site stable.  Neuro: Alert and oriented X 3. Moves all extremities spontaneously. Psych: Normal affect.  Labs    Chemistry Recent Labs  Lab 08/26/17 2108 08/28/17 0653 08/29/17 0040  NA 141 142 142  K 4.6 3.8 4.1  CL 112* 112* 114*  CO2 18* 21* 21*  GLUCOSE 166* 87 119*  BUN 57* 42* 41*  CREATININE 3.90* 3.93* 3.98*  CALCIUM 8.4* 8.5* 8.1*  PROT 7.4  --   --   ALBUMIN 3.8 3.0* 2.4*  AST 14*  --   --   ALT 11*  --   --   ALKPHOS 98  --   --   BILITOT 0.2*  --   --   GFRNONAA 12* 12* 12*  GFRAA 14* 14* 14*  ANIONGAP 11 9 7      Hematology Recent Labs  Lab 08/26/17 2108 08/28/17 0653 08/29/17 0040  WBC 10.5 11.1* 9.5  RBC 3.96 3.79* 3.48*  HGB 11.8* 10.7* 9.9*  HCT 37.6 36.2 33.5*  MCV 94.9 95.5 96.3  MCH 29.8 28.2 28.4  MCHC 31.4 29.6* 29.6*  RDW 15.1 15.2 15.4  PLT 261 233 197    Cardiac Enzymes Recent Labs  Lab 08/26/17 2108 08/27/17 0027 08/27/17 0435 08/27/17 0938  TROPONINI 0.07* 0.73* 11.15* 31.83*   No results for input(s): TROPIPOC in the last 168 hours.   BNPNo results for input(s): BNP, PROBNP in the last 168 hours.   DDimer No results for input(s): DDIMER in the last 168 hours.    Radiology    No results found.  Cardiac Studies   Cath: 08/28/17  Conclusion     Prox RCA lesion is 30% stenosed.  Mid RCA lesion is 80% stenosed.  Ost 1st Diag to 1st Diag lesion is 40% stenosed.  Mid LAD lesion is 30% stenosed.  Ost 2nd Mrg lesion is 99% stenosed.  A drug-eluting stent was successfully placed using a STENT SYNERGY DES 2.25X20.  Post intervention, there is a 0% residual stenosis.   1. NSTEMI secondary to severe stenosis in the second obtuse marginal branch 2. Successful PTCA/DES x 1 second OM branch 3. Severe stenosis in the mid segment of the large dominant RCA. This is not critical but will need  to be addressed with PCI later this week.  4. Mild non-obstructive disease in the LAD and Diagonal  Recommendations: Continue DAPT with ASA and Brilinta for one year. We used 80 cc contrast dye for her diagnostic cath and PCI. Will hydrate aggressively given her CKD and will plan staged PCI of the RCA later this week if renal function remains stable post PCI.   Patient Profile     52 y.o. female with history of lupus, CKD 4 on renal transplant list at [facility] and essential hypertension admitted with right arm and chest pain, and non-STEMI with peak troponin so far of 31 on IV heparin. Underwent cath noted above.   Assessment & Plan    1: Non-STEMI-EKG showed nonspecific ST and T wave changes. Troponin elevated to 31. Underwent cath noted above with Dr. Angelena Form with successful PCI/DES x1 to 2nd OM. Residual disease in the RCA with plans for staged intervention as long as renal function remains stable. Placed on DAPT with ASA/Brilinta post cath. Cr stable at 3.9 today. Will place on the board for staged intervention in the am pending Cr.   -- echo pending  2: Essential hypertension-blood pressure controlled on amlodipine, clonidine, and carvedilol. Losartan was stopped this admission.  3: Hyperlipidemia-on statin therapy. Check lipids in the am.   4: CKD 4-history of lupus on the renal transplant list at [facility]. She was seen by nephrology this admission with the risks/benefits of cath with contrast discussed. Cr stable at 3.9 this morning. Will plan for staged intervention in the am pending renal function.   Signed, Reino Bellis, NP  08/29/2017, 8:43 AM  Pager # [phone]   I have examined the patient and reviewed assessment and plan and discussed with patient.  Agree with above as stated.  Right groin stable.  Renal function stable.  Hydrate for PCI tomorrow.  I personlly reviewed the echo images and LV function appears normal.     Larae Grooms   For questions or updates, please contact [facility] Please consult [email or web address] for contact info under Cardiology/STEMI.

## 2017-08-29 NOTE — Care Management Note (Signed)
Case Management Note  Patient Details  Name: Lindsey York MRN: [id number] Date of Birth: 02-27-1966  Subjective/Objective:   From home, s/p stent intervention, will be on brilinta, RN Albin Felling will give patient the 30 day coupon.  NCM informed patient of the co pay and of the coupon she will receive and that her Cardiologist will have samples in the office also. She states she goes to Doctor, general practice, pharmacy states he has 9 pills but has some on order which should be in by tomorrow.                   Action/Plan: DC home when ready.  Expected Discharge Date:                  Expected Discharge Plan:  Home/Self Care  In-House Referral:     Discharge planning Services  CM Consult, Medication Assistance  Post Acute Care Choice:    Choice offered to:     DME Arranged:    DME Agency:     HH Arranged:    HH Agency:     Status of Service:  Completed, signed off  If discussed at [employer] of Stay Meetings, dates discussed:    Additional Comments:  Zenon Mayo, RN 08/29/2017, 2:27 PM

## 2017-08-29 NOTE — Progress Notes (Signed)
  Echocardiogram 2D Echocardiogram has been performed.  Rylinn Linzy T Danniell Rotundo 08/29/2017, 9:55 AM

## 2017-08-30 DIAGNOSIS — N179 Acute kidney failure, unspecified: Secondary | ICD-10-CM

## 2017-08-30 DIAGNOSIS — N189 Chronic kidney disease, unspecified: Secondary | ICD-10-CM

## 2017-08-30 LAB — LIPID PANEL
CHOLESTEROL: 158 mg/dL (ref 0–200)
HDL: 42 mg/dL (ref >40)
LDL Cholesterol: 94 mg/dL (ref 0–99)
Total CHOL/HDL Ratio: 3.8 RATIO
Triglycerides: 112 mg/dL (ref <150)
VLDL: 22 mg/dL (ref 0–40)

## 2017-08-30 LAB — RENAL FUNCTION PANEL
ANION GAP: 8 (ref 5–15)
Albumin: 2.5 g/dL — ABNORMAL LOW (ref 3.5–5.0)
BUN: 48 mg/dL — AB (ref 6–20)
CALCIUM: 8.1 mg/dL — AB (ref 8.9–10.3)
CO2: 20 mmol/L — ABNORMAL LOW (ref 22–32)
Chloride: 111 mmol/L (ref 101–111)
Creatinine, Ser: 4.98 mg/dL — ABNORMAL HIGH (ref 0.44–1.00)
GFR calc Af Amer: 11 mL/min — ABNORMAL LOW (ref >60)
GFR, EST NON AFRICAN AMERICAN: 9 mL/min — AB (ref >60)
GLUCOSE: 103 mg/dL — AB (ref 65–99)
PHOSPHORUS: 5.7 mg/dL — AB (ref 2.5–4.6)
Potassium: 4.9 mmol/L (ref 3.5–5.1)
SODIUM: 139 mmol/L (ref 135–145)

## 2017-08-30 LAB — CBC
HCT: 30.8 % — ABNORMAL LOW (ref 36.0–46.0)
Hemoglobin: 9.2 g/dL — ABNORMAL LOW (ref 12.0–15.0)
MCH: 28.7 pg (ref 26.0–34.0)
MCHC: 29.9 g/dL — AB (ref 30.0–36.0)
MCV: 96 fL (ref 78.0–100.0)
Platelets: 173 10*3/uL (ref 150–400)
RBC: 3.21 MIL/uL — ABNORMAL LOW (ref 3.87–5.11)
RDW: 15 % (ref 11.5–15.5)
WBC: 9.5 10*3/uL (ref 4.0–10.5)

## 2017-08-30 LAB — HEMOGLOBIN A1C
HEMOGLOBIN A1C: 5.4 % (ref 4.8–5.6)
MEAN PLASMA GLUCOSE: 108.28 mg/dL

## 2017-08-30 LAB — GLUCOSE, CAPILLARY: GLUCOSE-CAPILLARY: 88 mg/dL (ref 65–99)

## 2017-08-30 MED ORDER — SODIUM CHLORIDE 0.9 % IV SOLN
INTRAVENOUS | Status: DC
  Administered 2017-08-30 (×2): via INTRAVENOUS

## 2017-08-30 MED ORDER — SODIUM CHLORIDE 0.9 % WEIGHT BASED INFUSION
3.0000 mL/kg/h | INTRAVENOUS | Status: AC
  Administered 2017-08-30: 3 mL/kg/h via INTRAVENOUS

## 2017-08-30 MED ORDER — BISACODYL 5 MG PO TBEC: 5.0000 mg | DELAYED_RELEASE_TABLET | Freq: Every day | ORAL | Status: DC | PRN

## 2017-08-30 MED ORDER — SODIUM CHLORIDE 0.9 % WEIGHT BASED INFUSION: 1.0000 mL/kg/h | INTRAVENOUS | Status: DC

## 2017-08-30 NOTE — Progress Notes (Addendum)
Progress Note  Patient Name: Lindsey York Date of Encounter: 08/30/2017  Primary Cardiologist: Quay Burow, MD  Subjective   Feeling well this morning. Walked the hallways without symptoms yesterday.   Inpatient Medications    Scheduled Meds: . allopurinol  100 mg Oral Daily  . amLODipine  10 mg Oral Daily  . aspirin  81 mg Oral Daily  . carvedilol  6.25 mg Oral BID WC  . cloNIDine  0.1 mg Oral TID  . famotidine  20 mg Oral Daily  . mycophenolate  500 mg Oral Daily   And  . mycophenolate  250 mg Oral QHS  . pantoprazole  40 mg Oral Daily  . pravastatin  80 mg Oral q1800  . predniSONE  6 mg Oral Q breakfast  . sodium chloride flush  3 mL Intravenous Q12H  . sodium chloride flush  3 mL Intravenous Q12H  . ticagrelor  90 mg Oral BID   Continuous Infusions: . sodium chloride    . sodium chloride    . sodium chloride    . sodium chloride 1 mL/kg/hr (08/30/17 0501)   PRN Meds: sodium chloride, sodium chloride, acetaminophen, hydrALAZINE, ondansetron (ZOFRAN) IV, senna, sodium chloride flush, sodium chloride flush   Vital Signs    Vitals:   08/29/17 2155 08/29/17 2336 08/30/17 0623 08/30/17 0758  BP: 109/81  (!) 139/91 118/82  Pulse: 70  62 67  Resp: 13  16   Temp: 98.1 F (36.7 C)  97.7 F (36.5 C) 97.7 F (36.5 C)  TempSrc: Oral  Oral   SpO2: 100%  100% 100%  Weight:  169 lb 1.5 oz (76.7 kg) 169 lb 1.5 oz (76.7 kg)   Height:        Intake/Output Summary (Last 24 hours) at 08/30/2017 0818 Last data filed at 08/30/2017 0501 Gross per 24 hour  Intake 950 ml  Output -  Net 950 ml   Filed Weights   08/29/17 0609 08/29/17 2336 08/30/17 0623  Weight: 169 lb 1.5 oz (76.7 kg) 169 lb 1.5 oz (76.7 kg) 169 lb 1.5 oz (76.7 kg)    Telemetry    SR - Personally Reviewed  Physical Exam   General: Well developed, well nourished, female appearing in no acute distress. Head: Normocephalic, atraumatic.  Neck: Supple without bruits, JVD. Lungs:  Resp regular  and unlabored, CTA. Heart: RRR, S1, S2, no S3, S4, or murmur; no rub. Abdomen: Soft, non-tender, non-distended with normoactive bowel sounds. No hepatomegaly. No rebound/guarding. No obvious abdominal masses. Extremities: No clubbing, cyanosis, edema. Distal pedal pulses are 2+ bilaterally. Neuro: Alert and oriented X 3. Moves all extremities spontaneously. Psych: Normal affect.  Labs    Chemistry Recent Labs  Lab 08/26/17 2108 08/28/17 0653 08/29/17 0040 08/30/17 0227  NA 141 142 142 139  K 4.6 3.8 4.1 4.9  CL 112* 112* 114* 111  CO2 18* 21* 21* 20*  GLUCOSE 166* 87 119* 103*  BUN 57* 42* 41* 48*  CREATININE 3.90* 3.93* 3.98* 4.98*  CALCIUM 8.4* 8.5* 8.1* 8.1*  PROT 7.4  --   --   --   ALBUMIN 3.8 3.0* 2.4* 2.5*  AST 14*  --   --   --   ALT 11*  --   --   --   ALKPHOS 98  --   --   --   BILITOT 0.2*  --   --   --   GFRNONAA 12* 12* 12* 9*  GFRAA 14* 14* 14* 11*  ANIONGAP 11 9 7 8      Hematology Recent Labs  Lab 08/28/17 0653 08/29/17 0040 08/30/17 0227  WBC 11.1* 9.5 9.5  RBC 3.79* 3.48* 3.21*  HGB 10.7* 9.9* 9.2*  HCT 36.2 33.5* 30.8*  MCV 95.5 96.3 96.0  MCH 28.2 28.4 28.7  MCHC 29.6* 29.6* 29.9*  RDW 15.2 15.4 15.0  PLT 233 197 173    Cardiac Enzymes Recent Labs  Lab 08/26/17 2108 08/27/17 0027 08/27/17 0435 08/27/17 0938  TROPONINI 0.07* 0.73* 11.15* 31.83*   No results for input(s): TROPIPOC in the last 168 hours.   BNPNo results for input(s): BNP, PROBNP in the last 168 hours.   DDimer No results for input(s): DDIMER in the last 168 hours.    Radiology    No results found.  Cardiac Studies   Cath: 08/28/17  Conclusion     Prox RCA lesion is 30% stenosed.  Mid RCA lesion is 80% stenosed.  Ost 1st Diag to 1st Diag lesion is 40% stenosed.  Mid LAD lesion is 30% stenosed.  Ost 2nd Mrg lesion is 99% stenosed.  A drug-eluting stent was successfully placed using a STENT SYNERGY DES 2.25X20.  Post intervention, there is a 0%  residual stenosis.  1. NSTEMI secondary to severe stenosis in the second obtuse marginal branch 2. Successful PTCA/DES x 1 second OM branch 3. Severe stenosis in the mid segment of the large dominant RCA. This is not critical but will need to be addressed with PCI later this week.  4. Mild non-obstructive disease in the LAD and Diagonal  Recommendations: Continue DAPT with ASA and Brilinta for one year. We used 80 cc contrast dye for her diagnostic cath and PCI. Will hydrate aggressively given her CKD and will plan staged PCI of the RCA later this week if renal function remains stable post PCI.   TTE: 08/29/17  Study Conclusions  - Left ventricle: The cavity size was normal. Wall thickness was   increased in a pattern of moderate LVH. Systolic function was   normal. The estimated ejection fraction was in the range of 60%   to 65%. Wall motion was normal; there were no regional wall   motion abnormalities. Doppler parameters are consistent with   abnormal left ventricular relaxation (grade 1 diastolic   dysfunction). The E/e&' ratio is between 8-15, suggesting   indeterminate LV filling pressure. - Aortic valve: Trileaflet. There was no stenosis. There was   trivial regurgitation. - Mitral valve: Mildly thickened leaflets . There was trivial   regurgitation. - Left atrium: The atrium was normal in size. - Tricuspid valve: There was trivial regurgitation. - Pulmonary arteries: PA peak pressure: 21 mm Hg (S). - Inferior vena cava: The vessel was normal in size. The   respirophasic diameter changes were in the normal range (>= 50%),   consistent with normal central venous pressure.  Impressions:  - LVEF 60-65%, moderate LVH, normal wall motion, grade 1 DD,   indeterminate LV filling pressure, trivial AI, trivial MR, normal   LA Size, trivial TR, RVSP 21 mmHg, normal IVC.  Patient Profile     52 y.o. female with history of lupus, CKD 4 on renal transplant list at [facility] and essential hypertension admitted with right arm and chest pain, and non-STEMI with peak troponin so far of31on IV heparin. Underwent cath noted above.   Assessment & Plan    1: Non-STEMI-EKG showed nonspecific ST and T wave changes. Troponin elevated to31. Underwent cath  noted above with Dr. Angelena Form with successful PCI/DES x1 to 2nd OM. Residual disease in the RCA with plans for staged intervention as long as renal function remained stable. Placed on DAPT with ASA/Brilinta post cath. Cr was stable at 3.9 yesterday but elevated to 4.9 this morning. Will hydrate and postpone cath for now.  -- echo showed normal EF with no WMA.   2: Essential hypertension-blood pressure controlled on amlodipine, clonidine,andcarvedilol.Losartan was stopped this admission.  3: Hyperlipidemia-on statin therapy. LDL above goal at 94. Reports being tried on Crestor in the past and unable to tolerate. Consider PCSK9s as outpatient for better lipid control.   4: CKD 4-history of lupus on the renal transplant list at [facility].She was seen by nephrology this admission with the risks/benefits of cath with contrast discussed. Cr stable at 3.9 yesterday but increased to 4.9 this morning. Started on IV hydration.  -- BMET in the am.   Signed, Reino Bellis, NP  08/30/2017, 8:18 AM  Pager # [phone]   I have examined the patient and reviewed assessment and plan and discussed with patient.  Agree with above as stated.  Cr increased this AM.  Hydrate.  WIll recheck in AM.  If Cr still increased, would postpone RCA PCI until Cr has improved.  It could be done electively at a later time. Patient agreeable to this plan.   Larae Grooms   For questions or updates, please contact [facility] Please consult [email or web address] for contact info under Cardiology/STEMI.

## 2017-08-30 NOTE — Care Management Important Message (Signed)
Important Message  Patient Details  Name: Lindsey York MRN: [id number] Date of Birth: [birth year]   Medicare Important Message Given:  Yes    Staphany Ditton 08/30/2017, 3:20 PM

## 2017-08-30 NOTE — Progress Notes (Signed)
Patient ID: Lindsey York, female   DOB: 11/08/1965, 52 y.o.   MRN: [id number] [facility] Progress Note   Assessment/ Plan:   1.  Non-ST elevation MI: Status post coronary angiogram on 5/28 with drug-eluting stent deployment to 2nd OM with planned staged PCI to RCA later in the week with consideration of baseline chronic kidney disease.  Placed on aspirin and Brilinta post angiogram.  Unfortunately, creatinine elevated today precluding repeat angiogram. 2.  Acute kidney injury on chronic kidney disease stage IV: Elevated creatinine noted within the window for contrast-induced nephropathy.  Urine output not charted although the patient says that she is urinating "normally".  Agree with intravenous fluids-2 L normal saline over the next 20 hours as prescribed.  Continue to hold diuretics and losartan. 3.  Hypertension: Blood pressures currently well controlled on amlodipine, clonidine and carvedilol.  Limit hypotensive episodes. 4.  Systemic lupus erythematosus: Remains clinically quiescent on prednisone/CellCept.  Subjective:   Denies any chest pain or shortness of breath this morning, uneventful night.  Disappointed about rising creatinine.   Objective:   BP 118/82 (BP Location: Right Arm)   Pulse 67   Temp 97.7 F (36.5 C)   Resp 16   Ht 5\' 3"  (1.6 m)   Wt 76.7 kg (169 lb 1.5 oz)   SpO2 100%   BMI 29.95 kg/m   Intake/Output Summary (Last 24 hours) at 08/30/2017 1007 Last data filed at 08/30/2017 0501 Gross per 24 hour  Intake 950 ml  Output -  Net 950 ml   Weight change: 0 kg (0 lb)  Physical Exam: Gen: Comfortably sitting up in bed, eating breakfast  CVS: Pulse regular, normal rate, S1 and S2 normal without any murmur/rub Resp: Clear to auscultation, no rales/rhonchi Abd: Soft, obese, nontender Ext: No lower extremity edema  Imaging: No results found.  Labs: BMET Recent Labs  Lab 08/26/17 2108 08/28/17 0653 08/29/17 0040 08/30/17 0227  NA 141 142  142 139  K 4.6 3.8 4.1 4.9  CL 112* 112* 114* 111  CO2 18* 21* 21* 20*  GLUCOSE 166* 87 119* 103*  BUN 57* 42* 41* 48*  CREATININE 3.90* 3.93* 3.98* 4.98*  CALCIUM 8.4* 8.5* 8.1* 8.1*  PHOS  --  3.9 3.7 5.7*   CBC Recent Labs  Lab 08/26/17 2108 08/28/17 0653 08/29/17 0040 08/30/17 0227  WBC 10.5 11.1* 9.5 9.5  HGB 11.8* 10.7* 9.9* 9.2*  HCT 37.6 36.2 33.5* 30.8*  MCV 94.9 95.5 96.3 96.0  PLT 261 233 197 173    Medications:    . allopurinol  100 mg Oral Daily  . amLODipine  10 mg Oral Daily  . aspirin  81 mg Oral Daily  . carvedilol  6.25 mg Oral BID WC  . cloNIDine  0.1 mg Oral TID  . famotidine  20 mg Oral Daily  . mycophenolate  500 mg Oral Daily   And  . mycophenolate  250 mg Oral QHS  . pantoprazole  40 mg Oral Daily  . pravastatin  80 mg Oral q1800  . predniSONE  6 mg Oral Q breakfast  . sodium chloride flush  3 mL Intravenous Q12H  . sodium chloride flush  3 mL Intravenous Q12H  . ticagrelor  90 mg Oral BID   Elmarie Shiley, MD 08/30/2017, 10:07 AM

## 2017-08-30 NOTE — Progress Notes (Signed)
@IPLOG @        PROGRESS NOTE                                                                                                                                                                                                             Patient Demographics:    Lindsey York, is a 52 y.o. female, DOB - June 22, 1965, VQQ:[id number]  Admit date - 08/26/2017   Admitting Physician Vianne Bulls, MD  Outpatient Primary MD for the patient is Patient, No Pcp Per  LOS - 3  Chief Complaint  Patient presents with  . Chest Pain       Brief Narrative  See H&P for all details in brief, [name] with history of lupus, hypertension, CKD stage V who takes CellCept and prednisone for her lupus and follows with the nephrology department at [facility] comes in with 2 to 3-day history of right arm discomfort along with some substernal pressure that developed the night of admission.  In the ER her work-up was significant for elevated troponin, she was placed on heparin drip, cardiology was consulted and she was admitted for NSTEMI.    Patient and family initially requested transfer to [facility] but changed their mind.     Subjective:    Lindsey York denies any complaints this morning.  Still has not had a BM for 4 days but did not take senna that was ordered.  Prefers to take oral Dulcolax which she uses at home.  Aware of worsening renal functions.   Assessment  & Plan :     1. NSTEMI - was seen by cardiology, placed her on appropriate medical care which included aspirin, Coreg, high intensity statin along with heparin drip, patient initially wanted to be transferred to [facility] and this was arranged however there were no open beds at [facility] and patient agreed to continue care at [facility].  Underwent cardiac cath 5/28 with successful PCI/DES x1-2nd OM.  She has residual disease in the RCA with plans for staged intervention provided renal functions remained stable.   Started on DAPT with aspirin and Brilinta post-cath.  Cardiology following.  No further chest pain.  Staged PCI postponed due to worsening renal insufficiency.  If gets better, may be done tomorrow but if not then could be done electively later as per cardiology.  TTE 5/29: LVEF 60-65%, moderate LVH, normal wall motion grade 1 diastolic dysfunction  2.    Acute on CKD stage V.  Continue home medications which include CellCept and prednisone, baseline  creatinine is around 3.9.  Labs reviewed from [place] to hold Lasix and ARB.  Creatinine increased from 3.9-4.9 in the last 24 hours, likely from contrast nephropathy at cath.  Discussed with Dr. Posey Pronto, Nephrology.  IV fluids and hope for improvement.  Follow BMP in a.m.  3.  Hypertension.  Currently on combination of amlodipine, carvedilol and clonidine.  ARB on hold due to concern for worsening renal insufficiency.  As needed hydralazine added will monitor.  Controlled.  Avoid hypotension.  4.  Dyslipidemia.  On pravastatin.  As per cardiology, reports being tried on Crestor in the past and unable to tolerate.  5.  History of gout.  Continue allopurinol.  6.  Lupus: Stable on CellCept and steroids.  7.  Hyperglycemia: Continue SSI.  A1c 5.4.  8.  Constipation: No BM since 5/26.  Patient wishes to try Dulcolax, will change.      Diet :  Diet Order           Diet NPO time specified Except for: Sips with Meds  Diet effective midnight        Diet Heart Room service appropriate? Yes; Fluid consistency: Thin  Diet effective now           Family Communication  : Discussed in detail with patient's spouse and son at bedside.  Code Status :  Full  Disposition Plan  :  Inpt stay  Consults  :  Cards, Renal  Procedures  :    L Heart Cath and PCI 5/28  DVT Prophylaxis  :   Heparin    Lab Results  Component Value Date   PLT 173 08/30/2017    Inpatient Medications  Scheduled Meds: . allopurinol  100 mg Oral Daily  .  amLODipine  10 mg Oral Daily  . aspirin  81 mg Oral Daily  . carvedilol  6.25 mg Oral BID WC  . cloNIDine  0.1 mg Oral TID  . famotidine  20 mg Oral Daily  . mycophenolate  500 mg Oral Daily   And  . mycophenolate  250 mg Oral QHS  . pantoprazole  40 mg Oral Daily  . pravastatin  80 mg Oral q1800  . predniSONE  6 mg Oral Q breakfast  . sodium chloride flush  3 mL Intravenous Q12H  . sodium chloride flush  3 mL Intravenous Q12H  . ticagrelor  90 mg Oral BID   Continuous Infusions: . sodium chloride    . sodium chloride    . sodium chloride 100 mL/hr at 08/30/17 0730  . sodium chloride 1 mL/kg/hr (08/30/17 0501)   PRN Meds:.sodium chloride, sodium chloride, acetaminophen, hydrALAZINE, ondansetron (ZOFRAN) IV, senna, sodium chloride flush, sodium chloride flush  Antibiotics  :    Anti-infectives (From admission, onward)   None         Objective:   Vitals:   08/30/17 0623 08/30/17 0758 08/30/17 1206 08/30/17 1621  BP: (!) 139/91 118/82 (!) 126/93 118/90  Pulse: 62 67 67 74  Resp: 16  16 (!) 23  Temp: 97.7 F (36.5 C) 97.7 F (36.5 C)  97.9 F (36.6 C)  TempSrc: Oral     SpO2: 100% 100% 100% 100%  Weight: 76.7 kg (169 lb 1.5 oz)     Height:        Wt Readings from Last 3 Encounters:  08/30/17 76.7 kg (169 lb 1.5 oz)  07/30/16 78.9 kg (174 lb)  03/24/14 89.4 kg (197 lb)     Intake/Output  Summary (Last 24 hours) at 08/30/2017 1643 Last data filed at 08/30/2017 0501 Gross per 24 hour  Intake 710 ml  Output -  Net 710 ml     Physical Exam  General exam: Pleasant young female, moderately built and nourished, sitting up comfortably in bed.  Family at bedside. RS: Clear to auscultation.  No increased work of breathing.  Stable. CVS: S1 and S2 heard, RRR.  No JVD, murmurs or pedal edema.  Telemetry personally reviewed: Sinus rhythm. GI: Abdomen is nondistended, soft and nontender.  Normal bowel sounds heard.  Stable. CNS: Alert and oriented.  No focal  neurological deficits. Extremities: Symmetric 5 x 5 power.    Data Review:    CBC Recent Labs  Lab 08/26/17 2108 08/28/17 0653 08/29/17 0040 08/30/17 0227  WBC 10.5 11.1* 9.5 9.5  HGB 11.8* 10.7* 9.9* 9.2*  HCT 37.6 36.2 33.5* 30.8*  PLT 261 233 197 173  MCV 94.9 95.5 96.3 96.0  MCH 29.8 28.2 28.4 28.7  MCHC 31.4 29.6* 29.6* 29.9*  RDW 15.1 15.2 15.4 15.0    Chemistries  Recent Labs  Lab 08/26/17 2108 08/28/17 0653 08/29/17 0040 08/30/17 0227  NA 141 142 142 139  K 4.6 3.8 4.1 4.9  CL 112* 112* 114* 111  CO2 18* 21* 21* 20*  GLUCOSE 166* 87 119* 103*  BUN 57* 42* 41* 48*  CREATININE 3.90* 3.93* 3.98* 4.98*  CALCIUM 8.4* 8.5* 8.1* 8.1*  MG  --  1.9  --   --   AST 14*  --   --   --   ALT 11*  --   --   --   ALKPHOS 98  --   --   --   BILITOT 0.2*  --   --   --      Cardiac Enzymes Recent Labs  Lab 08/26/17 2108 08/27/17 0027 08/27/17 0435 08/27/17 0938  CKMB 1.7  --   --   --   TROPONINI 0.07* 0.73* 11.15* 31.83*   ------------------------------------------------------------------------------------------------------------------ No results found for: BNP  Micro Results Recent Results (from the past 240 hour(s))  MRSA PCR Screening     Status: None   Collection Time: 08/28/17  5:40 AM  Result Value Ref Range Status   MRSA by PCR NEGATIVE NEGATIVE Final    Comment:        The GeneXpert MRSA Assay (FDA approved for NASAL specimens only), is one component of a comprehensive MRSA colonization surveillance program. It is not intended to diagnose MRSA infection nor to guide or monitor treatment for MRSA infections. Performed at [facility], [place] [address] [place], [place] [address]     Radiology Reports Dg Chest 2 View  Result Date: 08/26/2017 CLINICAL DATA:  Chest pain EXAM: CHEST - 2 VIEW COMPARISON:  July 30, 2016 FINDINGS: There is no edema or consolidation. Heart is borderline enlarged with pulmonary vascularity normal. No  adenopathy. No bone lesions. IMPRESSION: Heart borderline enlarged.  No edema or consolidation. Electronically Signed   By: Lowella Grip III M.D.   On: 08/26/2017 21:45    Vernell Leep, MD, FACP, [facility] Pager [phone]  If 7PM-7AM, please contact night-coverage [email or web address] Password [facility] 08/30/2017, 4:43 PM

## 2017-08-31 ENCOUNTER — Encounter (HOSPITAL_COMMUNITY)
Admission: EM | Disposition: A | Discharge status: Home / Self Care | Payer: Self-pay | Source: Home / Self Care | Attending: Internal Medicine

## 2017-08-31 ENCOUNTER — Telehealth: Payer: Self-pay

## 2017-08-31 DIAGNOSIS — Z955 Presence of coronary angioplasty implant and graft: Secondary | ICD-10-CM

## 2017-08-31 DIAGNOSIS — E875 Hyperkalemia: Secondary | ICD-10-CM

## 2017-08-31 DIAGNOSIS — N184 Chronic kidney disease, stage 4 (severe): Secondary | ICD-10-CM

## 2017-08-31 LAB — RENAL FUNCTION PANEL
ANION GAP: 8 (ref 5–15)
Albumin: 2.6 g/dL — ABNORMAL LOW (ref 3.5–5.0)
BUN: 56 mg/dL — ABNORMAL HIGH (ref 6–20)
CHLORIDE: 116 mmol/L — AB (ref 101–111)
CO2: 16 mmol/L — ABNORMAL LOW (ref 22–32)
Calcium: 7.7 mg/dL — ABNORMAL LOW (ref 8.9–10.3)
Creatinine, Ser: 4.87 mg/dL — ABNORMAL HIGH (ref 0.44–1.00)
GFR calc non Af Amer: 9 mL/min — ABNORMAL LOW (ref >60)
GFR, EST AFRICAN AMERICAN: 11 mL/min — AB (ref >60)
Glucose, Bld: 108 mg/dL — ABNORMAL HIGH (ref 65–99)
Phosphorus: 6 mg/dL — ABNORMAL HIGH (ref 2.5–4.6)
Potassium: 5.3 mmol/L — ABNORMAL HIGH (ref 3.5–5.1)
Sodium: 140 mmol/L (ref 135–145)

## 2017-08-31 LAB — CBC
HEMATOCRIT: 29.4 % — AB (ref 36.0–46.0)
HEMOGLOBIN: 8.6 g/dL — AB (ref 12.0–15.0)
MCH: 27.7 pg (ref 26.0–34.0)
MCHC: 29.3 g/dL — ABNORMAL LOW (ref 30.0–36.0)
MCV: 94.8 fL (ref 78.0–100.0)
Platelets: 172 10*3/uL (ref 150–400)
RBC: 3.1 MIL/uL — AB (ref 3.87–5.11)
RDW: 14.7 % (ref 11.5–15.5)
WBC: 8.7 10*3/uL (ref 4.0–10.5)

## 2017-08-31 LAB — GLUCOSE, CAPILLARY: Glucose-Capillary: 92 mg/dL (ref 65–99)

## 2017-08-31 SURGERY — CORONARY STENT INTERVENTION
Anesthesia: LOCAL

## 2017-08-31 MED ORDER — CLONIDINE HCL 0.1 MG PO TABS: 0.1000 mg | ORAL_TABLET | Freq: Three times a day (TID) | ORAL | 0 refills | Status: DC

## 2017-08-31 MED ORDER — LOVASTATIN 20 MG PO TABS: 40.0000 mg | ORAL_TABLET | Freq: Every day | ORAL | 0 refills | Status: DC

## 2017-08-31 MED ORDER — CARVEDILOL 6.25 MG PO TABS: 6.2500 mg | ORAL_TABLET | Freq: Two times a day (BID) | ORAL | 0 refills | Status: DC

## 2017-08-31 MED ORDER — TICAGRELOR 90 MG PO TABS: 90.0000 mg | ORAL_TABLET | Freq: Two times a day (BID) | ORAL | 0 refills | Status: DC

## 2017-08-31 MED ORDER — SODIUM BICARBONATE 650 MG PO TABS: 650.0000 mg | ORAL_TABLET | Freq: Two times a day (BID) | ORAL | 0 refills | Status: AC

## 2017-08-31 MED ORDER — ASPIRIN 81 MG PO CHEW: 81.0000 mg | CHEWABLE_TABLET | Freq: Every day | ORAL | 0 refills | Status: DC

## 2017-08-31 MED ORDER — FUROSEMIDE 80 MG PO TABS: 80.0000 mg | ORAL_TABLET | ORAL | 0 refills | Status: DC

## 2017-08-31 NOTE — Progress Notes (Signed)
S:Feels well O:BP (!) 142/96   Pulse 72   Temp 98.2 F (36.8 C)   Resp 14   Ht 5\' 3"  (1.6 m)   Wt 80 kg (176 lb 5.9 oz)   SpO2 99%   BMI 31.24 kg/m   Intake/Output Summary (Last 24 hours) at 08/31/2017 1135 Last data filed at 08/31/2017 0647 Gross per 24 hour  Intake 2125 ml  Output 800 ml  Net 1325 ml   Intake/Output: I/O last 3 completed shifts: In: 3025.5 [P.O.:600; I.V.:2425.5] Out: 800 [Urine:800]  Intake/Output this shift:  No intake/output data recorded. Weight change: 3.3 kg (7 lb 4.4 oz) Gen: NAD CVS: no rub Resp: CTA Abd: benign Ext: no edema  Recent Labs  Lab 08/26/17 2108 08/28/17 0653 08/29/17 0040 08/30/17 0227 08/31/17 0242  NA 141 142 142 139 140  K 4.6 3.8 4.1 4.9 5.3*  CL 112* 112* 114* 111 116*  CO2 18* 21* 21* 20* 16*  GLUCOSE 166* 87 119* 103* 108*  BUN 57* 42* 41* 48* 56*  CREATININE 3.90* 3.93* 3.98* 4.98* 4.87*  ALBUMIN 3.8 3.0* 2.4* 2.5* 2.6*  CALCIUM 8.4* 8.5* 8.1* 8.1* 7.7*  PHOS  --  3.9 3.7 5.7* 6.0*  AST 14*  --   --   --   --   ALT 11*  --   --   --   --    Liver Function Tests: Recent Labs  Lab 08/26/17 2108  08/29/17 0040 08/30/17 0227 08/31/17 0242  AST 14*  --   --   --   --   ALT 11*  --   --   --   --   ALKPHOS 98  --   --   --   --   BILITOT 0.2*  --   --   --   --   PROT 7.4  --   --   --   --   ALBUMIN 3.8   < > 2.4* 2.5* 2.6*   < > = values in this interval not displayed.   Recent Labs  Lab 08/26/17 2108  LIPASE 45   No results for input(s): AMMONIA in the last 168 hours. CBC: Recent Labs  Lab 08/26/17 2108 08/28/17 0653 08/29/17 0040 08/30/17 0227 08/31/17 0242  WBC 10.5 11.1* 9.5 9.5 8.7  HGB 11.8* 10.7* 9.9* 9.2* 8.6*  HCT 37.6 36.2 33.5* 30.8* 29.4*  MCV 94.9 95.5 96.3 96.0 94.8  PLT 261 233 197 173 172   Cardiac Enzymes: Recent Labs  Lab 08/26/17 2108 08/27/17 0027 08/27/17 0435 08/27/17 0938  CKTOTAL 131  --   --   --   CKMB 1.7  --   --   --   TROPONINI 0.07* 0.73* 11.15*  31.83*   CBG: Recent Labs  Lab 08/28/17 0540 08/28/17 0732 08/29/17 1431 08/30/17 0640 08/31/17 0632  GLUCAP 91 91 147* 88 92    Iron Studies: No results for input(s): IRON, TIBC, TRANSFERRIN, FERRITIN in the last 72 hours. Studies/Results: No results found. Marland Kitchen allopurinol  100 mg Oral Daily  . amLODipine  10 mg Oral Daily  . aspirin  81 mg Oral Daily  . carvedilol  6.25 mg Oral BID WC  . cloNIDine  0.1 mg Oral TID  . famotidine  20 mg Oral Daily  . mycophenolate  500 mg Oral Daily   And  . mycophenolate  250 mg Oral QHS  . pantoprazole  40 mg Oral Daily  . pravastatin  80 mg Oral  q1800  . predniSONE  6 mg Oral Q breakfast  . sodium chloride flush  3 mL Intravenous Q12H  . sodium chloride flush  3 mL Intravenous Q12H  . ticagrelor  90 mg Oral BID    BMET    Component Value Date/Time   NA 140 08/31/2017 0242   K 5.3 (H) 08/31/2017 0242   CL 116 (H) 08/31/2017 0242   CO2 16 (L) 08/31/2017 0242   GLUCOSE 108 (H) 08/31/2017 0242   BUN 56 (H) 08/31/2017 0242   CREATININE 4.87 (H) 08/31/2017 0242   CALCIUM 7.7 (L) 08/31/2017 0242   GFRNONAA 9 (L) 08/31/2017 0242   GFRAA 11 (L) 08/31/2017 0242   CBC    Component Value Date/Time   WBC 8.7 08/31/2017 0242   RBC 3.10 (L) 08/31/2017 0242   HGB 8.6 (L) 08/31/2017 0242   HCT 29.4 (L) 08/31/2017 0242   PLT 172 08/31/2017 0242   MCV 94.8 08/31/2017 0242   MCH 27.7 08/31/2017 0242   MCHC 29.3 (L) 08/31/2017 0242   RDW 14.7 08/31/2017 0242   LYMPHSABS 2.2 03/24/2014 1145   MONOABS 0.6 03/24/2014 1145   EOSABS 0.0 03/24/2014 1145   BASOSABS 0.0 03/24/2014 1145     Assessment/Plan: 1.  NSTEMI- with rising troponins.  s/p PCI and DES of OM2 on 08/28/17 and planning staged PCI of RCA next week due to CIN and AKI/CKD stage 5.  Pt is without chest pain and per Cardiology ok for discharge and f/u next week for second procedure.  We discussed the need for cardiac catheterization and risks of contrast induced nephropathy and  potential need for dialysis following intervention, however her renal function has stabilized.  She understands and is willing to proceed.  Will continue to hold losartan and ask Cardiology to admit her prior to next procedure for gentle IV NS as well as reduce amount of contrast used as possible. 2. 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  Cr peaked at 4.98 and has improved to 4.87 from baseline of 3.9.  No uremic signs/symptoms.  Would restart lasix 80 mg daily and add sodium bicarbonate 650 mg bid but continue to hold ARB/ACE for now.  She will also need to f/u with her primary nephrologist at [facility].  She was interested in PD, however she may need to start with IHD and then transition to PD as an outpatient if her renal function worsens following next cardiac cath.   3. Hyperkalemia- restart lasix 80 mg daily and sodium bicarb 650 mg bid.  Recheck labs on Monday with her PCP or primary nephrologist.  We also discussed low potassium diet. 4. Anemia of CKD- stable 5. HTN- off ARB and would increase beta-blocker per Cardiology for better BP control. 6. Lupus- stable 7. DM- pt with elevated glucose which may be a new diagnosis.  Per primary svc. 8. Disposition- stable for discharge from renal standpoint.   Donetta Potts, MD [employer] [phone]

## 2017-08-31 NOTE — Progress Notes (Signed)
Progress Note  Patient Name: Lindsey York Date of Encounter: 08/31/2017  Primary Cardiologist: Quay Burow, MD   Subjective   No chest pain  Inpatient Medications    Scheduled Meds: . allopurinol  100 mg Oral Daily  . amLODipine  10 mg Oral Daily  . aspirin  81 mg Oral Daily  . carvedilol  6.25 mg Oral BID WC  . cloNIDine  0.1 mg Oral TID  . famotidine  20 mg Oral Daily  . mycophenolate  500 mg Oral Daily   And  . mycophenolate  250 mg Oral QHS  . pantoprazole  40 mg Oral Daily  . pravastatin  80 mg Oral q1800  . predniSONE  6 mg Oral Q breakfast  . sodium chloride flush  3 mL Intravenous Q12H  . sodium chloride flush  3 mL Intravenous Q12H  . ticagrelor  90 mg Oral BID   Continuous Infusions: . sodium chloride    . sodium chloride    . sodium chloride Stopped (08/31/17 0333)  . sodium chloride Stopped (08/30/17 0730)   PRN Meds: sodium chloride, sodium chloride, acetaminophen, bisacodyl, hydrALAZINE, ondansetron (ZOFRAN) IV, sodium chloride flush, sodium chloride flush   Vital Signs    Vitals:   08/31/17 0732 08/31/17 0800 08/31/17 0815 08/31/17 0830  BP: 130/85  127/90 (!) 142/96  Pulse: 62  70 72  Resp: 13 (!) 25 16 14   Temp: 98.2 F (36.8 C)     TempSrc:      SpO2: 99%  98% 99%  Weight:      Height:        Intake/Output Summary (Last 24 hours) at 08/31/2017 0948 Last data filed at 08/31/2017 0647 Gross per 24 hour  Intake 2125 ml  Output 800 ml  Net 1325 ml   Filed Weights   08/29/17 2336 08/30/17 0623 08/31/17 0421  Weight: 169 lb 1.5 oz (76.7 kg) 169 lb 1.5 oz (76.7 kg) 176 lb 5.9 oz (80 kg)    Telemetry    NSR - Personally Reviewed  ECG    None recent  Physical Exam   GEN: No acute distress.   Neck: No JVD Cardiac: RRR, no murmurs, rubs, or gallops.  Respiratory: Clear to auscultation bilaterally. GI: Soft, nontender, non-distended  MS: No edema; No deformity. Neuro:  Nonfocal  Psych: Normal affect   Labs      Chemistry Recent Labs  Lab 08/26/17 2108  08/29/17 0040 08/30/17 0227 08/31/17 0242  NA 141   < > 142 139 140  K 4.6   < > 4.1 4.9 5.3*  CL 112*   < > 114* 111 116*  CO2 18*   < > 21* 20* 16*  GLUCOSE 166*   < > 119* 103* 108*  BUN 57*   < > 41* 48* 56*  CREATININE 3.90*   < > 3.98* 4.98* 4.87*  CALCIUM 8.4*   < > 8.1* 8.1* 7.7*  PROT 7.4  --   --   --   --   ALBUMIN 3.8   < > 2.4* 2.5* 2.6*  AST 14*  --   --   --   --   ALT 11*  --   --   --   --   ALKPHOS 98  --   --   --   --   BILITOT 0.2*  --   --   --   --   GFRNONAA 12*   < > 12* 9* 9*  GFRAA  14*   < > 14* 11* 11*  ANIONGAP 11   < > 7 8 8    < > = values in this interval not displayed.     Hematology Recent Labs  Lab 08/29/17 0040 08/30/17 0227 08/31/17 0242  WBC 9.5 9.5 8.7  RBC 3.48* 3.21* 3.10*  HGB 9.9* 9.2* 8.6*  HCT 33.5* 30.8* 29.4*  MCV 96.3 96.0 94.8  MCH 28.4 28.7 27.7  MCHC 29.6* 29.9* 29.3*  RDW 15.4 15.0 14.7  PLT 197 173 172    Cardiac Enzymes Recent Labs  Lab 08/26/17 2108 08/27/17 0027 08/27/17 0435 08/27/17 0938  TROPONINI 0.07* 0.73* 11.15* 31.83*   No results for input(s): TROPIPOC in the last 168 hours.   BNPNo results for input(s): BNP, PROBNP in the last 168 hours.   DDimer No results for input(s): DDIMER in the last 168 hours.   Radiology    No results found.  Cardiac Studies   Cath results reviewed  Patient Profile     52 y.o. female with CAD, stage 4 renal disease  Assessment & Plan    1) CAD/NSTEMI: Planned PCI of RCA.  Cr still increased from baseline.  WIll postpone PCI of RCA to when renal function has improved.  I personally reviewed the films.  It is not a critical lesion.  I think she should be ok to wait, especially with renal risks.    2) CKD4: she has been hydrated.  Recheck BMet in one week.    OK to d/c on DAPT, statin.  WIll arrange f/u.  For questions or updates, please contact [place] Please consult [email or web address] for contact info  under Cardiology/STEMI.      Signed, Larae Grooms, MD  08/31/2017, 9:48 AM

## 2017-08-31 NOTE — Discharge Instructions (Signed)
Please get your medications reviewed and adjusted by your Primary MD. ° °Please request your Primary MD to go over all Hospital Tests and Procedure/Radiological results at the follow up, please get all Hospital records sent to your Prim MD by signing hospital release before you go home. ° °If you had Pneumonia of Lung problems at the Hospital: °Please get a 2 view Chest X ray done in 6-8 weeks after hospital discharge or sooner if instructed by your Primary MD. ° °If you have Congestive Heart Failure: °Please call your Cardiologist or Primary MD anytime you have any of the following symptoms:  °1) 3 pound weight gain in 24 hours or 5 pounds in 1 week  °2) shortness of breath, with or without a dry hacking cough  °3) swelling in the hands, feet or stomach  °4) if you have to sleep on extra pillows at night in order to breathe ° °Follow cardiac low salt diet and 1.5 lit/day fluid restriction. ° °If you have diabetes °Accuchecks 4 times/day, Once in AM empty stomach and then before each meal. °Log in all results and show them to your primary doctor at your next visit. °If any glucose reading is under 80 or above 300 call your primary MD immediately. ° °If you have Seizure/Convulsions/Epilepsy: °Please do not drive, operate heavy machinery, participate in activities at heights or participate in high speed sports until you have seen by Primary MD or a Neurologist and advised to do so again. ° °If you had Gastrointestinal Bleeding: °Please ask your Primary MD to check a complete blood count within one week of discharge or at your next visit. Your endoscopic/colonoscopic biopsies that are pending at the time of discharge, will also need to followed by your Primary MD. ° °Get Medicines reviewed and adjusted. °Please take all your medications with you for your next visit with your Primary MD ° °Please request your Primary MD to go over all hospital tests and procedure/radiological results at the follow up, please ask your  Primary MD to get all Hospital records sent to his/her office. ° °If you experience worsening of your admission symptoms, develop shortness of breath, life threatening emergency, suicidal or homicidal thoughts you must seek medical attention immediately by calling 911 or calling your MD immediately  if symptoms less severe. ° °You must read complete instructions/literature along with all the possible adverse reactions/side effects for all the Medicines you take and that have been prescribed to you. Take any new Medicines after you have completely understood and accpet all the possible adverse reactions/side effects.  ° °Do not drive or operate heavy machinery when taking Pain medications.  ° °Do not take more than prescribed Pain, Sleep and Anxiety Medications ° °Special Instructions: If you have smoked or chewed Tobacco  in the last 2 yrs please stop smoking, stop any regular Alcohol  and or any Recreational drug use. ° °Wear Seat belts while driving. ° °Please note °You were cared for by a hospitalist during your hospital stay. If you have any questions about your discharge medications or the care you received while you were in the hospital after you are discharged, you can call the unit and asked to speak with the hospitalist on call if the hospitalist that took care of you is not available. Once you are discharged, your primary care physician will handle any further medical issues. Please note that NO REFILLS for any discharge medications will be authorized once you are discharged, as it is imperative that you   return to your primary care physician (or establish a relationship with a primary care physician if you do not have one) for your aftercare needs so that they can reassess your need for medications and monitor your lab values.  You can reach the hospitalist office at phone [phone] or fax [phone]   If you do not have a primary care physician, you can call [phone] for a physician  referral.  Non-ST Segment Elevation Heart Attack A heart attack (myocardial infarction) happens when some of the heart muscle is injured or dies because it does not get enough oxygen. A non-ST segment elevation heart attack is a type of heart attack. It happens when the body does not get enough oxygen because an artery carrying blood to the heart muscles (coronary artery) becomes partly or temporarily blocked. This type of heart attack is usually less severe than the type of heart attack in which a coronary artery becomes completely blocked. What are the causes? The most common cause of this condition is a blocked coronary artery. A coronary artery can become blocked from a gradual buildup of cholesterol, fat, and plaque. A blood clot can form over the plaque and block blood flow. What increases the risk? This condition is more likely to develop in:  Smokers.  Males.  Older adults.  Overweight and obese adults.  People with high blood pressure (hypertension), high cholesterol, or diabetes.  People with a family history of heart disease.  People who do not get enough exercise.  People who are under a lot of stress.  People who drink too much alcohol.  People who use illegal street drugs that increase the heart rate, such as cocaine and methamphetamines.  What are the signs or symptoms? Symptoms of this condition include:  Chest pain or a feeling of pressure in the chest. It may feel like something is crushing or squeezing the chest.  Discomfort in the upper back or in the area between the shoulder blades.  Upper back pain.  Tingling in the hands and arms.  Shortness of breath.  Heartburn or indigestion.  Sudden cold sweats.  Unexplained sweating.  Sudden lightheadedness.  Unexplained feelings of nervousness or anxiety.  Feeling of tiredness, or not feeling well.  How is this diagnosed? This condition is diagnosed based on a person's signs and symptoms and a  physical exam. You may also have tests done, including:  Blood tests.  A chest X-ray.  An test to measure the electrical activity of the heart (electrocardiogram).  A test that uses sound waves to produce a picture of the heart (echocardiogram).  A test to look at the heart arteries (coronary angiogram).  If you are still having chest pain after 12-24 hours, or if your health care providers think your heart is at risk, you may have a procedure called cardiac catheterization. In this procedure, a long, thin tube is inserted into an artery in your groin and moved up to the arteries in your heart. This procedure helps your health care provider figure out the source of the problem. How is this treated? This condition may be treated with:  Bed rest in the hospital.  Medicines to relieve chest pain.  Medicines to protect the heart.  If you have a blockage, a procedure in which the artery is opened (angioplasty) and a stent is placed to keep the artery open.  After initial treatment you may need to take medicine to:  Keep your blood from clotting too easily.  Control your blood  pressure.  Lower your cholesterol.  Control abnormal heart rhythms (arrhythmias).  Follow these instructions at home:  Take medicines only as directed by your health care provider.  Do not take the following medicines unless your health care provider approves: ? Nonsteroidal anti-inflammatory drugs (NSAIDs), such as ibuprofen, naproxen, or celecoxib. ? Vitamin supplements that contain vitamin A, vitamin E, or both. ? Hormone replacement therapy that contains estrogen with or without progestin.  Make lifestyle changes as directed by your health care provider. These may include: ? Using no tobacco products, including cigarettes, chewing tobacco, and electronic cigarettes. If you are struggling to quit, ask your health care provider for help. ? Exercising as directed by your health care provider. Ask for a  list of activities that are safe for you. ? Eating a heart-healthy diet. Work with a Firefighter to learn healthy eating options. ? Maintaining a healthy weight. ? Managing other medical conditions, like diabetes. ? Reducing stress. ? Limiting how much alcohol you drink as directed by your health care provider. Get help right away if:  You have any symptoms of this condition. This information is not intended to replace advice given to you by your health care provider. Make sure you discuss any questions you have with your health care provider. Document Released: 10/17/2004 Document Revised: 08/26/2015 Document Reviewed: 02/25/2014 Elsevier Interactive Patient Education  [employer].

## 2017-08-31 NOTE — Telephone Encounter (Signed)
Staff message received: Lindsey York  P Cv Div Nl Clinical Pool FYI: Pt has a TCM appt on 09/10/17 at 11:30 with Barrett per [facility]

## 2017-08-31 NOTE — Telephone Encounter (Signed)
Patient still admitted to hospital 08/31/17

## 2017-08-31 NOTE — Discharge Summary (Signed)
Physician Discharge Summary  Lindsey York CHE:[id number] DOB: February 10, 1966  PCP: Patient, No Pcp Per  Admit date: 08/26/2017 Discharge date: 08/31/2017  Recommendations for Outpatient Follow-up:  1. Rosaria Ferries, PA-C/Cardiology on September 10, 2017 at 11:30 AM 2. Dr. Donato Heinz, Nephrology: Nephrology MDs office will call patient for repeat lab work (CBC & Renal panel) to be done on 6/3 or 6/4 and MD follow-up thereafter.  [name]. 3. Lorne Skeens Close, NP/Nephrology at [facility] on 09/06/2017.  In case patient unable to follow-up with Nephrology in town.  Home Health: None Equipment/Devices: None  Discharge Condition: Improved and stable CODE STATUS: Full Diet recommendation: Heart healthy diet.  Discharge Diagnoses:  Principal Problem:   NSTEMI (non-ST elevated myocardial infarction) ([place]) Active Problems:   Hypertension   CKD (chronic kidney disease), stage V (HCC)   Systemic lupus (HCC)   Brief Summary: [name] with history of lupus, hypertension, CKD stage V who takes CellCept and prednisone for her lupus and follows with the nephrology department at [facility] comes in with 2 to 3-day history of right arm discomfort along with some substernal pressure that developed the night of admission. In the ER her work-up was significant for elevated troponin, she was placed on heparin drip, cardiology was consulted and she was admitted for NSTEMI.   Patient and family initially requested transfer to [facility] but changed their mind.  Assessment and plan:     NSTEMI- was seen by cardiology, placed her on appropriate medical care which included aspirin, Coreg, high intensity statin along with heparin drip, patient initially wanted to be transferred to [facility] and this was arranged however there were no open beds at [facility] and patient agreed to continue care at [facility].  Underwent cardiac cath 5/28 with successful PCI/DES  x1-2nd OM.  She has residual disease in the RCA with plans for staged intervention provided renal functions remained stable.  Started on DAPT with aspirin and Brilinta post-cath. No further chest pain.  Staged PCI postponed due to worsening renal insufficiency.    As per cardiology follow-up and discussion with them, PCI to RCA postponed until renal functions improve and stabilize.  It is not a critical lesion and should be okay to wait especially with renal risks.  Cardiology arranged follow-up.  TTE 5/29: LVEF 60-65%, moderate LVH, normal wall motion grade 1 diastolic dysfunction.  Acute on CKD stage V. Continue home medications which include CellCept and prednisone, baseline creatinine is around 3.9. Labs reviewed from [place] to hold Lasix and ARB.    Creatinine peaked to 4.98 likely related to contrast-induced nephropathy.  Hydrated with IV fluids and creatinine only marginally improved to 4.87.  No uremic signs or symptoms.  Discussed in detail with Dr. Marval Regal on day of discharge and he recommended resuming Lasix at 80 mg daily, adding sodium bicarbonate 650 mg twice daily, continue to hold ARB/ACEI.  Patient indicated that she does not have consistent provider follow-up at [facility] and wished to pursue nephrology follow-up in [place].  [name] and he will arrange outpatient follow-up with him including labs for next week.  Mild hyperkalemia: As per nephrology, resume Lasix 80 mg daily for couple days and then every other day and added sodium bicarbonate.  Patient has been counseled extensively regarding low potassium diet and she verbalizes understanding.  Follow-up early next week with repeat labs.  ARB/ACEI held.  Metabolic acidosis: Sodium bicarbonate and follow labs outpatient.  Hypertension. Currently on combination  of amlodipine, carvedilol and clonidine.  ARB/ACEI on hold due to concern for worsening renal insufficiency. Stable.  Dyslipidemia.   Patient's lovastatin dose was increased.  As per cardiology, reports being tried on Crestor in the past and unable to tolerate.  As discussed with Dr. Irish Lack, patient will follow up with outpatient Cardiology to try high intensity statin or PCSK9 inhibitor.  History of gout.  Continue allopurinol.  Lupus: Stable on CellCept and steroids.  Hyperglycemia: Continue SSI.  A1c 5.4.  Constipation: No BM since 5/26 but patient unwilling to try bowel regimen in the hospital.    Consultations:  Cardiology  Nephrology  Procedures:  L Heart Cath and PCI 5/28    Discharge Instructions  Discharge Instructions    (HEART FAILURE PATIENTS) Call MD:  Anytime you have any of the following symptoms: 1) 3 pound weight gain in 24 hours or 5 pounds in 1 week 2) shortness of breath, with or without a dry hacking cough 3) swelling in the hands, feet or stomach 4) if you have to sleep on extra pillows at night in order to breathe.   Complete by:  As directed    Amb Referral to Cardiac Rehabilitation   Complete by:  As directed    Diagnosis:   Coronary Stents NSTEMI     Call MD for:  difficulty breathing, headache or visual disturbances   Complete by:  As directed    Call MD for:  extreme fatigue   Complete by:  As directed    Call MD for:  persistant dizziness or light-headedness   Complete by:  As directed    Call MD for:  redness, tenderness, or signs of infection (pain, swelling, redness, odor or green/yellow discharge around incision site)   Complete by:  As directed    Call MD for:  severe uncontrolled pain   Complete by:  As directed    Diet - low sodium heart healthy   Complete by:  As directed    Increase activity slowly   Complete by:  As directed        Medication List    STOP taking these medications   benazepril 20 MG tablet Commonly known as:  LOTENSIN   losartan 50 MG tablet Commonly known as:  COZAAR     TAKE these medications   acetaminophen 325 MG  tablet Commonly known as:  TYLENOL Take 650 mg by mouth every 6 (six) hours as needed for moderate pain.   allopurinol 100 MG tablet Commonly known as:  ZYLOPRIM Take 100 mg by mouth daily.   amLODipine 10 MG tablet Commonly known as:  NORVASC Take 10 mg by mouth daily.   ARANESP (ALB FREE) SURECLICK IJ Inject 706 mg as directed every 14 (fourteen) days.   aspirin 81 MG chewable tablet Chew 1 tablet (81 mg total) by mouth daily. Start taking on:  09/01/2017   carvedilol 6.25 MG tablet Commonly known as:  COREG Take 1 tablet (6.25 mg total) by mouth 2 (two) times daily with a meal.   cloNIDine 0.1 MG tablet Commonly known as:  CATAPRES Take 1 tablet (0.1 mg total) by mouth 3 (three) times daily. What changed:  when to take this   furosemide 80 MG tablet Commonly known as:  LASIX Take 1 tablet (80 mg total) by mouth every other day. Take 1 tablet daily for 3 days and then go back to taking 1 tablet every other day. What changed:  additional instructions   lovastatin 20 MG  tablet Commonly known as:  MEVACOR Take 2 tablets (40 mg total) by mouth daily. What changed:  how much to take   mycophenolate 500 MG tablet Commonly known as:  CELLCEPT Take 500 mg by mouth every morning.   mycophenolate 250 MG capsule Commonly known as:  CELLCEPT Take 250 mg by mouth at bedtime.   oxybutynin 5 MG tablet Commonly known as:  DITROPAN Take 5 mg by mouth daily as needed for bladder spasms.   predniSONE 1 MG tablet Commonly known as:  DELTASONE Take 1 mg by mouth daily with breakfast.   predniSONE 5 MG tablet Commonly known as:  DELTASONE Take 5 mg by mouth daily with breakfast.   ranitidine 150 MG tablet Commonly known as:  ZANTAC Take 150 mg by mouth every morning.   sodium bicarbonate 650 MG tablet Take 1 tablet (650 mg total) by mouth 2 (two) times daily.   ticagrelor 90 MG Tabs tablet Commonly known as:  BRILINTA Take 1 tablet (90 mg total) by mouth 2 (two) times  daily.      Follow-up Information    Barrett, Evelene Croon, PA-C. Go on 09/10/2017.   Specialties:  Cardiology, Radiology Why:  @11 :30am for TCM follow up.  Contact information: [address] [place] [address] [phone]        Close, Lorne Skeens, NP Follow up on 09/06/2017.   Specialty:  Nephrology Why:  To be seen with repeat labs (CBC & renal panel). Contact information: [place] [address] [phone]        Donato Heinz, MD. Schedule an appointment as soon as possible for a visit.   Specialty:  Nephrology Why:  MDs office will call you for repeat lab work (CBC & renal panel) to be done on Monday or Tuesday (6/3 or 6/4) and MD follow-up.  Please call them if you do not hear from them in 1 or 2 business days. Contact information: [address] [place] [address] [phone]          Allergies  Allergen Reactions  . Hydroxychloroquine Hives and Rash  . Codeine       Procedures/Studies: Dg Chest 2 View  Result Date: 08/26/2017 CLINICAL DATA:  Chest pain EXAM: CHEST - 2 VIEW COMPARISON:  July 30, 2016 FINDINGS: There is no edema or consolidation. Heart is borderline enlarged with pulmonary vascularity normal. No adenopathy. No bone lesions. IMPRESSION: Heart borderline enlarged.  No edema or consolidation. Electronically Signed   By: Lowella Grip III M.D.   On: 08/26/2017 21:45      Subjective: Patient denied complaints.  No chest pain, dyspnea, palpitations, dizziness or lightheadedness.  Hesitant to try bowel regimen in the hospital and no BM for couple days.  Passing flatus.  No abdominal pain, nausea or vomiting.  Discharge Exam:  Vitals:   08/31/17 0815 08/31/17 0830 08/31/17 1145 08/31/17 1509  BP: 127/90 (!) 142/96 (!) 135/91 122/82  Pulse: 70 72 68 66  Resp: 16 14 15 16   Temp:   97.9 F (36.6 C) 98 F (36.7 C)  TempSrc:    Oral  SpO2: 98% 99%  100%  Weight:      Height:         General exam: Pleasant young female, moderately built and nourished, sitting up comfortably in bed.   RS: Clear to auscultation.  No increased work of breathing.   CVS: S1 and S2 heard, RRR.  No JVD, murmurs or pedal edema.  Telemetry  personally reviewed: Sinus rhythm. GI: Abdomen is nondistended, soft and nontender.  Normal bowel sounds heard.  CNS: Alert and oriented.  No focal neurological deficits. Extremities: Symmetric 5 x 5 power.     The results of significant diagnostics from this hospitalization (including imaging, microbiology, ancillary and laboratory) are listed below for reference.     Microbiology: Recent Results (from the past 240 hour(s))  MRSA PCR Screening     Status: None   Collection Time: 08/28/17  5:40 AM  Result Value Ref Range Status   MRSA by PCR NEGATIVE NEGATIVE Final    Comment:        The GeneXpert MRSA Assay (FDA approved for NASAL specimens only), is one component of a comprehensive MRSA colonization surveillance program. It is not intended to diagnose MRSA infection nor to guide or monitor treatment for MRSA infections. Performed at [facility], [place] [address], [place], [place] [address]      Labs: CBC: Recent Labs  Lab 08/26/17 2108 08/28/17 0653 08/29/17 0040 08/30/17 0227 08/31/17 0242  WBC 10.5 11.1* 9.5 9.5 8.7  HGB 11.8* 10.7* 9.9* 9.2* 8.6*  HCT 37.6 36.2 33.5* 30.8* 29.4*  MCV 94.9 95.5 96.3 96.0 94.8  PLT 261 233 197 173 354   Basic Metabolic Panel: Recent Labs  Lab 08/26/17 2108 08/28/17 0653 08/29/17 0040 08/30/17 0227 08/31/17 0242  NA 141 142 142 139 140  K 4.6 3.8 4.1 4.9 5.3*  CL 112* 112* 114* 111 116*  CO2 18* 21* 21* 20* 16*  GLUCOSE 166* 87 119* 103* 108*  BUN 57* 42* 41* 48* 56*  CREATININE 3.90* 3.93* 3.98* 4.98* 4.87*  CALCIUM 8.4* 8.5* 8.1* 8.1* 7.7*  MG  --  1.9  --   --   --   PHOS  --  3.9 3.7 5.7* 6.0*   Liver Function Tests: Recent Labs  Lab 08/26/17 2108 08/28/17 0653  08/29/17 0040 08/30/17 0227 08/31/17 0242  AST 14*  --   --   --   --   ALT 11*  --   --   --   --   ALKPHOS 98  --   --   --   --   BILITOT 0.2*  --   --   --   --   PROT 7.4  --   --   --   --   ALBUMIN 3.8 3.0* 2.4* 2.5* 2.6*   BNP (last 3 results) No results for input(s): BNP in the last 8760 hours. Cardiac Enzymes: Recent Labs  Lab 08/26/17 2108 08/27/17 0027 08/27/17 0435 08/27/17 0938  CKTOTAL 131  --   --   --   CKMB 1.7  --   --   --   TROPONINI 0.07* 0.73* 11.15* 31.83*   CBG: Recent Labs  Lab 08/28/17 0540 08/28/17 0732 08/29/17 1431 08/30/17 0640 08/31/17 0632  GLUCAP 91 91 147* 88 92   Hgb A1c Recent Labs    08/29/17 2120  HGBA1C 5.4   Lipid Profile Recent Labs    08/30/17 0227  CHOL 158  HDL 42  LDLCALC 94  TRIG 112  CHOLHDL 3.8   Urinalysis    Component Value Date/Time   COLORURINE YELLOW 08/26/2017 2316   APPEARANCEUR HAZY (A) 08/26/2017 2316   LABSPEC 1.020 08/26/2017 2316   PHURINE 6.5 08/26/2017 2316   GLUCOSEU 100 (A) 08/26/2017 2316   HGBUR MODERATE (A) 08/26/2017 2316   BILIRUBINUR NEGATIVE 08/26/2017 2316   [place] 08/26/2017  2316   PROTEINUR >300 (A) 08/26/2017 2316   UROBILINOGEN 0.2 03/24/2014 1420   NITRITE NEGATIVE 08/26/2017 2316   LEUKOCYTESUR NEGATIVE 08/26/2017 2316      Time coordinating discharge: 40 minutes  SIGNED:  Vernell Leep, MD, FACP, [facility]. [facility] Pager [phone]  If 7PM-7AM, please contact night-coverage [email or web address] Password [facility] 08/31/2017, 3:36 PM

## 2017-08-31 NOTE — Progress Notes (Signed)
CARDIAC REHAB PHASE I   PRE:  Rate/Rhythm: 9 SR  BP:  Sitting: 127/90      SaO2: 100 RA  MODE:  Ambulation: 856ft 85 SR  POST:  Rate/Rhythm: 75 SR  BP:  Sitting: 142/96    SaO2: 100 RA  Pt ambulated in hallway with slow steady gait with two standing rest breaks. C/o foot pain that did not get better or worse with ambulation. Pt and son educated on importance of Brilinta, ASA and NTG. MI booklet given, along with exercise guidelines and diet sheet. Reinforced her renal diet trumps heart healthy diet. Will refer to CRP II [place], but pt will not be able to attend until staged intervention.  [id number]  Rufina Falco, RN BSN 08/31/2017 8:57 AM

## 2017-09-01 ENCOUNTER — Telehealth: Payer: Self-pay | Admitting: Physician Assistant

## 2017-09-01 DIAGNOSIS — R197 Diarrhea, unspecified: Secondary | ICD-10-CM

## 2017-09-01 HISTORY — DX: Diarrhea, unspecified: R19.7

## 2017-09-01 MED ORDER — NITROGLYCERIN 0.4 MG SL SUBL: 0.4000 mg | SUBLINGUAL_TABLET | SUBLINGUAL | 11 refills | Status: DC | PRN

## 2017-09-01 NOTE — Telephone Encounter (Signed)
Called after hours for SL nitro Rx. Sent to requested pharmacy.

## 2017-09-03 NOTE — Telephone Encounter (Signed)
Patient contacted regarding discharge from [facility] on 08-31-17  Patient understands to follow up with provider barrett on 09-10-17 at 11:30 am at [facility] Patient understands discharge instructions? yes Patient understands medications and regiment? yes Patient understands to bring all medications to this visit? yes

## 2017-09-07 ENCOUNTER — Telehealth (HOSPITAL_COMMUNITY): Payer: Self-pay

## 2017-09-07 NOTE — Telephone Encounter (Signed)
Called patient to see if she is interested in the Cardiac Rehab Program - Patient stated she is interested in the program. Will continue to follow patient as she has another procedure scheduled. Went over insurance with patient and patient verbalized understanding. Will contact patient for scheduling once procedure and follow up appt has been completed.

## 2017-09-07 NOTE — Telephone Encounter (Signed)
Patients insurance is active and benefits verified through [facility] - $45.00 co-pay, no deductible, out of pocket amount of $5,900/$1,600.99 has been met, no co-insurance, and no pre-authorization is required. Passport/reference [phone]  Will contact patient to see if she is interested in the Cardiac Rehab. If interested, patient will need to complete follow up appt. Once completed, patient will be contacted for scheduling upon review by the RN Navigator.

## 2017-09-10 ENCOUNTER — Encounter: Payer: Self-pay | Admitting: Physician Assistant

## 2017-09-10 ENCOUNTER — Ambulatory Visit: Payer: Medicare HMO | Admitting: Physician Assistant

## 2017-09-10 VITALS — BP 128/82 | HR 61 | Ht 63.0 in | Wt 169.0 lb

## 2017-09-10 DIAGNOSIS — I10 Essential (primary) hypertension: Secondary | ICD-10-CM

## 2017-09-10 DIAGNOSIS — N185 Chronic kidney disease, stage 5: Secondary | ICD-10-CM

## 2017-09-10 DIAGNOSIS — I214 Non-ST elevation (NSTEMI) myocardial infarction: Secondary | ICD-10-CM | POA: Diagnosis not present

## 2017-09-10 MED ORDER — CLONIDINE HCL 0.1 MG PO TABS: ORAL_TABLET | ORAL | 0 refills | Status: DC

## 2017-09-10 MED ORDER — HYDRALAZINE HCL 25 MG PO TABS: 25.0000 mg | ORAL_TABLET | Freq: Three times a day (TID) | ORAL | 4 refills | Status: DC

## 2017-09-10 MED ORDER — LOVASTATIN 40 MG PO TABS: 40.0000 mg | ORAL_TABLET | Freq: Every day | ORAL | 6 refills | Status: DC

## 2017-09-10 NOTE — Progress Notes (Addendum)
Cardiology Office Note   Date:  4/65/0354   ID:  Lindsey York, DOB [birth year], MRN [id number]  PCP:  Patient, No Pcp Per  Cardiologist:  Dr Gwenlyn Found  Nephrololgist at [facility] and Dr John Giovanni, PA-C    History of Present Illness: Lindsey York is a 52 y.o. female with a history of Lupus, HTN, CKD V, GERD, HLD, gout, anemia  Admitted 05/26-05/31/2019 for NSTEMI s/p PCI/DES x1-2nd OM. Staged PCI RCA if renal function stable but it worsened, Cr 4.98, PCI defered. On ASA/Brilinta, EF 60-65% by echo. resuming Lasix at 80 mg daily at d/c, adding sodium bicarbonate 650 mg twice daily, continue to hold ARB/ACEI, f/u with Dr Arty Baumgartner. Try high-intensity statin or PCSK9 06/04, saw Dr Arty Baumgartner, labs done 94/49>>QPRFF 63/84  Lindsey York presents for cardiology follow up.  She is concerned about the fluctuations in her BP. Highest-lowest 148/101-128/88. Am med doses are 8-9 am, pm meds at 6 (including hydralazine and clonidine) and takes hydralazine and clonidine again at bedtime, midnight.   Her BP is running higher during the afternoon.   She is not having side effects from the medications and is otherwise tolerating them well.  No chest pain. When she lays flat on her back, she will feel some chest pressure. This resolves when she sits up. She has not had LE edema. No PND, no true orthopnea.   Her Lasix was cut back to 40 mg qod by the Nephrologist.  She is tracking her weight and it has not increased.  She has not had chest pain since discharge from hospital.  She is compliant with the dual antiplatelet therapy.  She got a call from cardiac rehab, but they stated she could not attend until she had the second PCI.  She has been increasing her activity a little, but is not exercising.  She is getting Avastin in her L eye for a bleeding issue, ok to take w/ cardiac/renal drugs? She gets steroid injections in her R foot at times for the lupus, and problems  with that?   Past Medical History:  Diagnosis Date  . Anemia   . CKD (chronic kidney disease), stage V ([place])    "followed at [facility]" (08/28/2017)  . Coronary artery disease   . GERD (gastroesophageal reflux disease)   . High cholesterol   . History of gout    "on daily RX"" (08/28/2017)  . Hypertension   . NSTEMI (non-ST elevated myocardial infarction) ([place]) 08/26/2017  . Systemic lupus ([place])     Past Surgical History:  Procedure Laterality Date  . BREAST SURGERY Left ~ 1978   "tumors taken out"  . CORONARY STENT INTERVENTION N/A 08/28/2017   Procedure: CORONARY STENT INTERVENTION;  Surgeon: Burnell Blanks, MD;  Location: [place] [facility];  Service: Cardiovascular;  Laterality: N/A;  . HERNIA REPAIR    . LAPAROSCOPIC CHOLECYSTECTOMY    . LEFT HEART CATH AND CORONARY ANGIOGRAPHY N/A 08/28/2017   Procedure: LEFT HEART CATH AND CORONARY ANGIOGRAPHY;  Surgeon: Burnell Blanks, MD;  Location: [place] [facility];  Service: Cardiovascular;  Laterality: N/A;  . MOUTH SURGERY     oral cavity biopsy  . TUBAL LIGATION    . UMBILICAL HERNIA REPAIR      Current Outpatient Medications  Medication Sig Dispense Refill  . acetaminophen (TYLENOL) 325 MG tablet Take 650 mg by mouth every 6 (six) hours as needed for moderate pain.    Marland Kitchen allopurinol (ZYLOPRIM) 100 MG tablet Take 100  mg by mouth daily.    Marland Kitchen amLODipine (NORVASC) 10 MG tablet Take 10 mg by mouth daily.    Marland Kitchen aspirin 81 MG chewable tablet Chew 1 tablet (81 mg total) by mouth daily. 30 tablet 0  . carvedilol (COREG) 6.25 MG tablet Take 1 tablet (6.25 mg total) by mouth 2 (two) times daily with a meal. 60 tablet 0  . cloNIDine (CATAPRES) 0.1 MG tablet Take 1 tablet (0.1 mg total) by mouth 3 (three) times daily. 90 tablet 0  . Darbepoetin Alfa-Polysorbate (ARANESP, ALB FREE, SURECLICK IJ) Inject 179 mg as directed every 14 (fourteen) days.     . furosemide (LASIX) 80 MG tablet Take 1 tablet (80 mg total) by mouth every  other day. Take 1 tablet daily for 3 days and then go back to taking 1 tablet every other day. 30 tablet 0  . hydrALAZINE (APRESOLINE) 25 MG tablet Take 25 mg by mouth 3 (three) times daily.  4  . lovastatin (MEVACOR) 20 MG tablet Take 2 tablets (40 mg total) by mouth daily. 60 tablet 0  . mycophenolate (CELLCEPT) 250 MG capsule Take 250 mg by mouth at bedtime.     . mycophenolate (CELLCEPT) 500 MG tablet Take 500 mg by mouth every morning.     . nitroGLYCERIN (NITROSTAT) 0.4 MG SL tablet Place 1 tablet (0.4 mg total) under the tongue every 5 (five) minutes as needed for chest pain. 25 tablet 11  . oxybutynin (DITROPAN) 5 MG tablet Take 5 mg by mouth daily as needed for bladder spasms.    . predniSONE (DELTASONE) 1 MG tablet Take 1 mg by mouth daily with breakfast.    . predniSONE (DELTASONE) 5 MG tablet Take 5 mg by mouth daily with breakfast.    . ranitidine (ZANTAC) 150 MG tablet Take 150 mg by mouth every morning.     . sodium bicarbonate 650 MG tablet Take 1 tablet (650 mg total) by mouth 2 (two) times daily. 60 tablet 0  . ticagrelor (BRILINTA) 90 MG TABS tablet Take 1 tablet (90 mg total) by mouth 2 (two) times daily. 60 tablet 0   No current facility-administered medications for this visit.     Allergies:   Hydroxychloroquine and Codeine    Social History:  The patient  reports that she has never smoked. She has never used smokeless tobacco. She reports that she does not drink alcohol or use drugs.   Family History:  The patient's family history is not on file.    ROS:  Please see the history of present illness. All other systems are reviewed and negative.    PHYSICAL EXAM: VS:  BP 128/82   Pulse 61   Ht 5\' 3"  (1.6 m)   Wt 169 lb (76.7 kg)   BMI 29.94 kg/m  , BMI Body mass index is 29.94 kg/m. GEN: Well nourished, well developed, female in no acute distress  HEENT: normal for age  Neck: Minimal JVD, no carotid bruit, no masses Cardiac: RRR; no murmur, no rubs, or  gallops Respiratory:  clear to auscultation bilaterally, normal work of breathing GI: soft, nontender, nondistended, + BS MS: no deformity or atrophy; no edema; distal pulses are 2+ in all 4 extremities   Skin: warm and dry, no rash Neuro:  Strength and sensation are intact Psych: euthymic mood, full affect   EKG:  EKG is ordered today. The ekg ordered today demonstrates sinus rhythm, heart rate 61, diffuse T wave flattening.  Decreased amplitude and most of  the precordial and inferior leads.  This is different from her ECG 08/29/2017, unclear significance.  ECHO: 08/29/2017 - Left ventricle: The cavity size was normal. Wall thickness was   increased in a pattern of moderate LVH. Systolic function was   normal. The estimated ejection fraction was in the range of 60%   to 65%. Wall motion was normal; there were no regional wall   motion abnormalities. Doppler parameters are consistent with   abnormal left ventricular relaxation (grade 1 diastolic   dysfunction). The E/e&' ratio is between 8-15, suggesting   indeterminate LV filling pressure. - Aortic valve: Trileaflet. There was no stenosis. There was   trivial regurgitation. - Mitral valve: Mildly thickened leaflets . There was trivial   regurgitation. - Left atrium: The atrium was normal in size. - Tricuspid valve: There was trivial regurgitation. - Pulmonary arteries: PA peak pressure: 21 mm Hg (S). - Inferior vena cava: The vessel was normal in size. The   respirophasic diameter changes were in the normal range (>= 50%),   consistent with normal central venous pressure. Impressions: - LVEF 60-65%, moderate LVH, normal wall motion, grade 1 DD,   indeterminate LV filling pressure, trivial AI, trivial MR, normal   LA Size, trivial TR, RVSP 21 mmHg, normal IVC.  CATH: 08/28/2017 - Left ventricle: The cavity size was normal. Wall thickness was   increased in a pattern of moderate LVH. Systolic function was   normal. The estimated  ejection fraction was in the range of 60%   to 65%. Wall motion was normal; there were no regional wall   motion abnormalities. Doppler parameters are consistent with   abnormal left ventricular relaxation (grade 1 diastolic   dysfunction). The E/e&' ratio is between 8-15, suggesting   indeterminate LV filling pressure. - Aortic valve: Trileaflet. There was no stenosis. There was   trivial regurgitation. - Mitral valve: Mildly thickened leaflets . There was trivial   regurgitation. - Left atrium: The atrium was normal in size. - Tricuspid valve: There was trivial regurgitation. - Pulmonary arteries: PA peak pressure: 21 mm Hg (S). - Inferior vena cava: The vessel was normal in size. The   respirophasic diameter changes were in the normal range (>= 50%),   consistent with normal central venous pressure. Impressions: - LVEF 60-65%, moderate LVH, normal wall motion, grade 1 DD,   indeterminate LV filling pressure, trivial AI, trivial MR, normal   LA Size, trivial TR, RVSP 21 mmHg, normal IVC. Post-Intervention Diagram          Recent Labs: 08/26/2017: ALT 11 08/28/2017: Magnesium 1.9 08/31/2017: BUN 56; Creatinine, Ser 4.87; Hemoglobin 8.6; Platelets 172; Potassium 5.3; Sodium 140    Lipid Panel    Component Value Date/Time   CHOL 158 08/30/2017 0227   TRIG 112 08/30/2017 0227   HDL 42 08/30/2017 0227   CHOLHDL 3.8 08/30/2017 0227   VLDL 22 08/30/2017 0227   LDLCALC 94 08/30/2017 0227     Wt Readings from Last 3 Encounters:  09/10/17 169 lb (76.7 kg)  08/31/17 176 lb 5.9 oz (80 kg)  07/30/16 174 lb (78.9 kg)     Other studies Reviewed: Additional studies/ records that were reviewed today include: Office notes, hospital records and testing.  ASSESSMENT AND PLAN:  1.  NSTEMI/CAD: She had a drug-eluting stent to her OM and has a residual 80% RCA.  She is tolerating aspirin, Brilinta, and carvedilol.  Her blood pressure is well controlled on current medications.  She  has a  residual RCA that may need PCI.  However, she is not currently having any symptoms from it.  I explained to the patient that if we could treat her medically and she could increase her activity successfully without having chest pain, we would not have to do another heart catheterization with its risk to her kidneys.  She is agreeable to this as a plan.  She understands that she will need to have the nitroglycerin with her at all times.  She understands that if mild chest pain is relieved by rest or one sublingual nitroglycerin she needs to call us.  However, if the pain does not resolve or she has to take multiple nitroglycerin tablets, she needs to go to the emergency room by EMS.  Continue to follow symptoms closely.  2.  Chronic kidney disease, stage V.  She is followed closely by the nephrologist.  At this time, discussions are starting about creating a dialysis graft.   - Dr. Gwenlyn Found to advise if she would need PCI of the RCA prior to this procedure.  3.  Hypertension: Based on her description of her varying blood pressures in times of day, I think it would help if she could take the hydralazine and clonidine every 8 hours instead of 3 times a day.  If we could avoid medication changes by making the dosing schedule very consistent, this would benefit her and she is willing to try it.  4.  Hyperlipidemia: Her goal LDL is less than 70.  She understands that she needs to eat a low-cholesterol, renal diet.  We will refer her to a nutritionist for this.  She has only been taking 20 mg of the lovastatin, she is requested to increase that to 40 mg.  If we can get her LDL to goal with out resorting to a higher intensity statin, that would probably be best.   Current medicines are reviewed at length with the patient today.  The patient has concerns regarding medicines.  Concerns were addressed.  I do not see a problem with the Avastin for her eyes or getting an occasional steroid shot for her right foot  with regards to her cardiac meds.  The following changes have been made: Change the hydralazine and clonidine to every 8 hours and increase the lovastatin to 40 mg nightly  Labs/ tests ordered today include:  No orders of the defined types were placed in this encounter.  Disposition:   FU with Dr Gwenlyn Found  Signed, Rosaria Ferries, PA-C  09/10/2017 12:00 PM    [place] Phone: [phone]; Fax: [phone]  This note was written with the assistance of speech recognition software. Please excuse any transcriptional errors.  09/11/2017 addendum Dr. Gwenlyn Found reviewed the cardiac catheterization films and the patient's history. His recommendation is that before she has any surgical procedures, we do a stress test. If the stress test shows inferior ischemia, then she will need PCI of her RCA, understanding that it is a high risk procedure. If she is otherwise doing well, and stress test is negative for ischemia, continue medical therapy. The patient was contacted by phone and advised. Rosaria Ferries, PA-C 09/11/2017 5:09 PM

## 2017-09-10 NOTE — Patient Instructions (Signed)
Medication Instructions: INCREASE Lovastatin to 40mg  take 1 tablet daily at bedtime Take Clonidine and hydralazine three times a day-take 1 tab at 8am, 4pm and midnight (before bed)  Labwork: None   Testing/Procedures: None   Follow-Up: Your physician recommends that you schedule a follow-up appointment in: 3 months with Dr Gwenlyn Found  Any Other Special Instructions Will Be Listed Below (If Applicable). 1. Can start Cardio Rehab 2. Referral has been sent for nutritionist  3. Continue to check your weight daily If you need a refill on your cardiac medications before your next appointment, please call your pharmacy.

## 2017-09-11 ENCOUNTER — Telehealth: Payer: Self-pay | Admitting: *Deleted

## 2017-09-11 NOTE — Telephone Encounter (Signed)
Patient seen by Lindsey Aho PA on 09/10/17 She discussed with Dr Gwenlyn Found   09/11/2017 addendum Dr. Gwenlyn Found reviewed the cardiac catheterization films and the patient's history. His recommendation is that before she has any surgical procedures, we do a stress test. If the stress test shows inferior ischemia, then she will need PCI of her RCA, understanding that it is a high risk procedure. If she is otherwise doing well, and stress test is negative for ischemia, continue medical therapy. The patient was contacted by phone and advised. Lindsey Ferries, PA-C

## 2017-09-12 NOTE — Telephone Encounter (Signed)
Spoke with patient and reviewed information.   Patient stated she was having increased bruising with the Brilinta. Explained to patient importance in continuing the Brilinta and that bruising is common.   She wanted to let Suanne Marker B PA know she did reach out to Cardiac Rehab but first thing available is August, is waiting to hear back from [facility] She knows she can walk but wanted to know if ok to ride bicycle and do light arm weight lifting. Also, has appointment with dietician in 2 weeks.  Will forward to [place] B PA for review

## 2017-09-13 NOTE — Telephone Encounter (Signed)
The ASA/Brilinta combo will make her bruise more easily. Be careful and wear long pants and long sleeves as much as is reasonable. Call us to be seen sooner if she is concerned, most people become more careful and it is ok.  Ok to ride the bike and do [employer]. Keep exertion level to a 5/10 and increase gradually.  Thanks

## 2017-09-19 NOTE — Telephone Encounter (Signed)
Left message to call back  

## 2017-09-21 ENCOUNTER — Telehealth: Payer: Self-pay | Admitting: Cardiovascular Disease

## 2017-09-21 ENCOUNTER — Encounter (HOSPITAL_COMMUNITY): Payer: Self-pay | Admitting: Emergency Medicine

## 2017-09-21 ENCOUNTER — Inpatient Hospital Stay (HOSPITAL_COMMUNITY)
Admission: EM | Admit: 2017-09-21 | Discharge: 2017-09-26 | DRG: 682 | Disposition: A | Discharge status: Home / Self Care | Payer: Medicare HMO | Attending: Internal Medicine | Admitting: Internal Medicine

## 2017-09-21 ENCOUNTER — Other Ambulatory Visit: Payer: Self-pay

## 2017-09-21 DIAGNOSIS — Z7952 Long term (current) use of systemic steroids: Secondary | ICD-10-CM

## 2017-09-21 DIAGNOSIS — E876 Hypokalemia: Secondary | ICD-10-CM | POA: Diagnosis present

## 2017-09-21 DIAGNOSIS — E872 Acidosis, unspecified: Secondary | ICD-10-CM | POA: Diagnosis present

## 2017-09-21 DIAGNOSIS — Z79899 Other long term (current) drug therapy: Secondary | ICD-10-CM

## 2017-09-21 DIAGNOSIS — I214 Non-ST elevation (NSTEMI) myocardial infarction: Secondary | ICD-10-CM | POA: Diagnosis present

## 2017-09-21 DIAGNOSIS — Z7982 Long term (current) use of aspirin: Secondary | ICD-10-CM | POA: Diagnosis not present

## 2017-09-21 DIAGNOSIS — E785 Hyperlipidemia, unspecified: Secondary | ICD-10-CM | POA: Diagnosis present

## 2017-09-21 DIAGNOSIS — E860 Dehydration: Secondary | ICD-10-CM | POA: Diagnosis present

## 2017-09-21 DIAGNOSIS — B9620 Unspecified Escherichia coli [E. coli] as the cause of diseases classified elsewhere: Secondary | ICD-10-CM | POA: Diagnosis present

## 2017-09-21 DIAGNOSIS — I10 Essential (primary) hypertension: Secondary | ICD-10-CM

## 2017-09-21 DIAGNOSIS — M3214 Glomerular disease in systemic lupus erythematosus: Secondary | ICD-10-CM | POA: Diagnosis present

## 2017-09-21 DIAGNOSIS — I2510 Atherosclerotic heart disease of native coronary artery without angina pectoris: Secondary | ICD-10-CM | POA: Diagnosis present

## 2017-09-21 DIAGNOSIS — N185 Chronic kidney disease, stage 5: Secondary | ICD-10-CM

## 2017-09-21 DIAGNOSIS — A09 Infectious gastroenteritis and colitis, unspecified: Secondary | ICD-10-CM | POA: Diagnosis not present

## 2017-09-21 DIAGNOSIS — N186 End stage renal disease: Secondary | ICD-10-CM | POA: Diagnosis present

## 2017-09-21 DIAGNOSIS — Z7901 Long term (current) use of anticoagulants: Secondary | ICD-10-CM | POA: Diagnosis not present

## 2017-09-21 DIAGNOSIS — E869 Volume depletion, unspecified: Secondary | ICD-10-CM

## 2017-09-21 DIAGNOSIS — R197 Diarrhea, unspecified: Secondary | ICD-10-CM | POA: Diagnosis not present

## 2017-09-21 DIAGNOSIS — R05 Cough: Secondary | ICD-10-CM | POA: Diagnosis not present

## 2017-09-21 DIAGNOSIS — Z955 Presence of coronary angioplasty implant and graft: Secondary | ICD-10-CM

## 2017-09-21 DIAGNOSIS — N189 Chronic kidney disease, unspecified: Secondary | ICD-10-CM | POA: Diagnosis not present

## 2017-09-21 DIAGNOSIS — K591 Functional diarrhea: Secondary | ICD-10-CM | POA: Diagnosis not present

## 2017-09-21 DIAGNOSIS — N179 Acute kidney failure, unspecified: Principal | ICD-10-CM | POA: Diagnosis present

## 2017-09-21 DIAGNOSIS — M329 Systemic lupus erythematosus, unspecified: Secondary | ICD-10-CM | POA: Diagnosis present

## 2017-09-21 DIAGNOSIS — R109 Unspecified abdominal pain: Secondary | ICD-10-CM | POA: Diagnosis present

## 2017-09-21 DIAGNOSIS — N184 Chronic kidney disease, stage 4 (severe): Secondary | ICD-10-CM | POA: Diagnosis not present

## 2017-09-21 DIAGNOSIS — N170 Acute kidney failure with tubular necrosis: Secondary | ICD-10-CM | POA: Diagnosis not present

## 2017-09-21 DIAGNOSIS — R059 Cough, unspecified: Secondary | ICD-10-CM

## 2017-09-21 HISTORY — DX: Diarrhea, unspecified: R19.7

## 2017-09-21 LAB — URINALYSIS, ROUTINE W REFLEX MICROSCOPIC
BILIRUBIN URINE: NEGATIVE
Glucose, UA: NEGATIVE mg/dL
KETONES UR: NEGATIVE mg/dL
LEUKOCYTES UA: NEGATIVE
Nitrite: NEGATIVE
PH: 5 (ref 5.0–8.0)
Protein, ur: >=300 mg/dL — AB
SPECIFIC GRAVITY, URINE: 1.01 (ref 1.005–1.030)

## 2017-09-21 LAB — LIPASE, BLOOD: LIPASE: 55 U/L — AB (ref 11–51)

## 2017-09-21 LAB — CBC
HCT: 33.5 % — ABNORMAL LOW (ref 36.0–46.0)
Hemoglobin: 10.5 g/dL — ABNORMAL LOW (ref 12.0–15.0)
MCH: 28.5 pg (ref 26.0–34.0)
MCHC: 31.3 g/dL (ref 30.0–36.0)
MCV: 90.8 fL (ref 78.0–100.0)
PLATELETS: 140 10*3/uL — AB (ref 150–400)
RBC: 3.69 MIL/uL — ABNORMAL LOW (ref 3.87–5.11)
RDW: 14.1 % (ref 11.5–15.5)
WBC: 15.2 10*3/uL — AB (ref 4.0–10.5)

## 2017-09-21 LAB — COMPREHENSIVE METABOLIC PANEL
ALBUMIN: 3.6 g/dL (ref 3.5–5.0)
ALT: 11 U/L — AB (ref 14–54)
AST: 12 U/L — AB (ref 15–41)
Alkaline Phosphatase: 67 U/L (ref 38–126)
Anion gap: 14 (ref 5–15)
BUN: 82 mg/dL — AB (ref 6–20)
CHLORIDE: 111 mmol/L (ref 101–111)
CO2: 12 mmol/L — AB (ref 22–32)
CREATININE: 5.72 mg/dL — AB (ref 0.44–1.00)
Calcium: 8.3 mg/dL — ABNORMAL LOW (ref 8.9–10.3)
GFR calc Af Amer: 9 mL/min — ABNORMAL LOW (ref >60)
GFR calc non Af Amer: 8 mL/min — ABNORMAL LOW (ref >60)
Glucose, Bld: 89 mg/dL (ref 65–99)
Potassium: 4.4 mmol/L (ref 3.5–5.1)
SODIUM: 137 mmol/L (ref 135–145)
Total Bilirubin: 0.6 mg/dL (ref 0.3–1.2)
Total Protein: 6.8 g/dL (ref 6.5–8.1)

## 2017-09-21 LAB — I-STAT BETA HCG BLOOD, ED (MC, WL, AP ONLY)

## 2017-09-21 LAB — C DIFFICILE QUICK SCREEN W PCR REFLEX
C Diff antigen: NEGATIVE
C Diff interpretation: NOT DETECTED
C Diff toxin: NEGATIVE

## 2017-09-21 MED ORDER — OXYBUTYNIN CHLORIDE 5 MG PO TABS: 5.0000 mg | ORAL_TABLET | Freq: Every day | ORAL | Status: DC | PRN

## 2017-09-21 MED ORDER — MYCOPHENOLATE MOFETIL 250 MG PO CAPS: 250.0000 mg | ORAL_CAPSULE | Freq: Every day | ORAL | Status: DC

## 2017-09-21 MED ORDER — FAMOTIDINE 20 MG PO TABS
20.0000 mg | ORAL_TABLET | Freq: Every day | ORAL | Status: DC
  Administered 2017-09-21 – 2017-09-26 (×6): 20 mg via ORAL
  Filled 2017-09-21 (×6): qty 1

## 2017-09-21 MED ORDER — PREDNISONE 1 MG PO TABS
1.0000 mg | ORAL_TABLET | Freq: Every day | ORAL | Status: DC
  Administered 2017-09-22 – 2017-09-25 (×4): 1 mg via ORAL
  Filled 2017-09-21 (×4): qty 1

## 2017-09-21 MED ORDER — TICAGRELOR 90 MG PO TABS
90.0000 mg | ORAL_TABLET | Freq: Two times a day (BID) | ORAL | Status: DC
  Administered 2017-09-21 – 2017-09-26 (×10): 90 mg via ORAL
  Filled 2017-09-21 (×11): qty 1

## 2017-09-21 MED ORDER — PRAVASTATIN SODIUM 40 MG PO TABS
40.0000 mg | ORAL_TABLET | Freq: Every day | ORAL | Status: DC
  Administered 2017-09-21 – 2017-09-25 (×5): 40 mg via ORAL
  Filled 2017-09-21 (×5): qty 1

## 2017-09-21 MED ORDER — AMLODIPINE BESYLATE 5 MG PO TABS
10.0000 mg | ORAL_TABLET | Freq: Every day | ORAL | Status: DC
  Administered 2017-09-22 – 2017-09-26 (×5): 10 mg via ORAL
  Filled 2017-09-21 (×5): qty 2

## 2017-09-21 MED ORDER — CLONIDINE HCL 0.1 MG PO TABS
0.1000 mg | ORAL_TABLET | Freq: Every day | ORAL | Status: DC
  Administered 2017-09-22 – 2017-09-26 (×4): 0.1 mg via ORAL
  Filled 2017-09-21 (×5): qty 1

## 2017-09-21 MED ORDER — MYCOPHENOLATE MOFETIL 250 MG PO CAPS
500.0000 mg | ORAL_CAPSULE | Freq: Every day | ORAL | Status: DC
  Filled 2017-09-21: qty 2

## 2017-09-21 MED ORDER — ONDANSETRON HCL 4 MG/2ML IJ SOLN
4.0000 mg | Freq: Once | INTRAMUSCULAR | Status: AC
  Administered 2017-09-21: 4 mg via INTRAVENOUS
  Filled 2017-09-21: qty 2

## 2017-09-21 MED ORDER — HYDRALAZINE HCL 25 MG PO TABS
25.0000 mg | ORAL_TABLET | ORAL | Status: DC
  Administered 2017-09-22 – 2017-09-26 (×13): 25 mg via ORAL
  Filled 2017-09-21 (×14): qty 1

## 2017-09-21 MED ORDER — ONDANSETRON HCL 4 MG PO TABS
4.0000 mg | ORAL_TABLET | Freq: Four times a day (QID) | ORAL | Status: DC | PRN
  Filled 2017-09-21: qty 1

## 2017-09-21 MED ORDER — SODIUM BICARBONATE 8.4 % IV SOLN
INTRAVENOUS | Status: DC
  Administered 2017-09-21 – 2017-09-22 (×3): via INTRAVENOUS
  Filled 2017-09-21 (×9): qty 850

## 2017-09-21 MED ORDER — ACETAMINOPHEN 325 MG PO TABS
650.0000 mg | ORAL_TABLET | Freq: Four times a day (QID) | ORAL | Status: DC | PRN
  Administered 2017-09-23: 650 mg via ORAL
  Filled 2017-09-21: qty 2

## 2017-09-21 MED ORDER — DIPHENOXYLATE-ATROPINE 2.5-0.025 MG/5ML PO LIQD
5.0000 mL | Freq: Four times a day (QID) | ORAL | Status: DC | PRN
  Administered 2017-09-21: 10 mL via ORAL
  Filled 2017-09-21: qty 10

## 2017-09-21 MED ORDER — CHOLESTYRAMINE 4 G PO PACK
4.0000 g | PACK | Freq: Three times a day (TID) | ORAL | Status: DC
  Administered 2017-09-22 – 2017-09-26 (×6): 4 g via ORAL
  Filled 2017-09-21 (×17): qty 1

## 2017-09-21 MED ORDER — SODIUM BICARBONATE 650 MG PO TABS
650.0000 mg | ORAL_TABLET | Freq: Two times a day (BID) | ORAL | Status: DC
  Administered 2017-09-21 – 2017-09-24 (×6): 650 mg via ORAL
  Filled 2017-09-21 (×7): qty 1

## 2017-09-21 MED ORDER — ALLOPURINOL 100 MG PO TABS
100.0000 mg | ORAL_TABLET | Freq: Every day | ORAL | Status: DC
  Administered 2017-09-21 – 2017-09-26 (×6): 100 mg via ORAL
  Filled 2017-09-21 (×6): qty 1

## 2017-09-21 MED ORDER — SODIUM CHLORIDE 0.9 % IV SOLN
INTRAVENOUS | Status: DC
  Administered 2017-09-21: 15:00:00 via INTRAVENOUS

## 2017-09-21 MED ORDER — CARVEDILOL 12.5 MG PO TABS
6.2500 mg | ORAL_TABLET | Freq: Two times a day (BID) | ORAL | Status: DC
  Administered 2017-09-22 – 2017-09-26 (×9): 6.25 mg via ORAL
  Filled 2017-09-21 (×10): qty 1

## 2017-09-21 MED ORDER — SODIUM CHLORIDE 0.9 % IV BOLUS
1000.0000 mL | Freq: Once | INTRAVENOUS | Status: AC
  Administered 2017-09-21: 1000 mL via INTRAVENOUS

## 2017-09-21 MED ORDER — ONDANSETRON HCL 4 MG/2ML IJ SOLN
4.0000 mg | Freq: Four times a day (QID) | INTRAMUSCULAR | Status: DC | PRN
  Administered 2017-09-22: 4 mg via INTRAVENOUS
  Filled 2017-09-21: qty 2

## 2017-09-21 MED ORDER — ASPIRIN 81 MG PO CHEW
81.0000 mg | CHEWABLE_TABLET | Freq: Every day | ORAL | Status: DC
  Administered 2017-09-22 – 2017-09-26 (×5): 81 mg via ORAL
  Filled 2017-09-21 (×5): qty 1

## 2017-09-21 NOTE — ED Provider Notes (Signed)
[facility] EMERGENCY DEPARTMENT Provider Note   CSN: [id number] Arrival date & time: 09/21/17  1027     History   Chief Complaint Chief Complaint  Patient presents with  . Abdominal Pain    HPI Lindsey York is a 52 y.o. female.  52 year old female with prior medical history significant for stage V kidney disease, CAD, hypertension, and lupus presents with complaint of profound diarrhea.  Patient reports multiple loose watery bowel movements over the last week.  Symptoms have worsened over the last 2 to 3 days.  Patient reports approximately 15 bowel movements over the last 24 hours.  She denies abdominal pain.  She did does report mild associated nausea.  She denies fever.  She describes stool as loose and watery without blood or melena.  Patient notably reports a recent admission 3 weeks ago for placement of cardiac stent.  Patient denies recent antibiotic use.  Patient is not currently on dialysis.    The history is provided by the patient and medical records.  Diarrhea   This is a new problem. The current episode started more than 2 days ago. The problem occurs more than 10 times per day. The problem has been gradually worsening. The stool consistency is described as watery. There has been no fever. Pertinent negatives include no abdominal pain, no vomiting, no chills and no sweats.    Past Medical History:  Diagnosis Date  . Anemia   . CKD (chronic kidney disease), stage V ([place])    "followed at [facility]" (08/28/2017)  . Coronary artery disease   . GERD (gastroesophageal reflux disease)   . High cholesterol   . History of gout    "on daily RX"" (08/28/2017)  . Hypertension   . NSTEMI (non-ST elevated myocardial infarction) ([place]) 08/26/2017  . Systemic lupus [facility])     Patient Active Problem List   Diagnosis Date Noted  . Acute renal failure superimposed on stage 4 chronic kidney disease ([place]) 09/21/2017  . Diarrhea 09/21/2017  . Acute kidney  injury superimposed on CKD ([place]) 09/21/2017  . Metabolic acidosis 81/44/8185  . Hypertension 08/27/2017  . CKD (chronic kidney disease), stage V ([place]) 08/27/2017  . Systemic lupus ([place]) 08/27/2017  . ESRD (end stage renal disease) ([place])   . Precordial chest pain   . Troponin I above reference range   . NSTEMI (non-ST elevated myocardial infarction) ([place]) 08/26/2017    Past Surgical History:  Procedure Laterality Date  . BREAST SURGERY Left ~ 1978   "tumors taken out"  . CORONARY STENT INTERVENTION N/A 08/28/2017   Procedure: CORONARY STENT INTERVENTION;  Surgeon: Burnell Blanks, MD;  Location: [facility];  Service: Cardiovascular;  Laterality: N/A;  . HERNIA REPAIR    . LAPAROSCOPIC CHOLECYSTECTOMY    . LEFT HEART CATH AND CORONARY ANGIOGRAPHY N/A 08/28/2017   Procedure: LEFT HEART CATH AND CORONARY ANGIOGRAPHY;  Surgeon: Burnell Blanks, MD;  Location: [facility];  Service: Cardiovascular;  Laterality: N/A;  . MOUTH SURGERY     oral cavity biopsy  . TUBAL LIGATION    . UMBILICAL HERNIA REPAIR       OB History   None      Home Medications    Prior to Admission medications   Medication Sig Start Date End Date Taking? Authorizing Provider  acetaminophen (TYLENOL) 325 MG tablet Take 650 mg by mouth every 6 (six) hours as needed for moderate pain.    [provider]  allopurinol (ZYLOPRIM)  100 MG tablet Take 100 mg by mouth daily.    [provider]  amLODipine (NORVASC) 10 MG tablet Take 10 mg by mouth daily.    [provider]  aspirin 81 MG chewable tablet Chew 1 tablet (81 mg total) by mouth daily. 09/01/17   Hongalgi, Lenis Dickinson, MD  carvedilol (COREG) 6.25 MG tablet Take 1 tablet (6.25 mg total) by mouth 2 (two) times daily with a meal. 08/31/17   Hongalgi, Lenis Dickinson, MD  cloNIDine (CATAPRES) 0.1 MG tablet Take 1 tab at 8am, 4pm and midnight 09/10/17   Barrett, Evelene Croon, PA-C  Darbepoetin Alfa-Polysorbate (ARANESP, ALB  FREE, SURECLICK IJ) Inject 324 mg as directed every 14 (fourteen) days.     [provider]  furosemide (LASIX) 80 MG tablet Take 1 tablet (80 mg total) by mouth every other day. Take 1 tablet daily for 3 days and then go back to taking 1 tablet every other day. 08/31/17   Hongalgi, Lenis Dickinson, MD  hydrALAZINE (APRESOLINE) 25 MG tablet Take 1 tablet (25 mg total) by mouth 3 (three) times daily. Take 1 tab at 8am, 4pm, and midnight 09/10/17   Barrett, Evelene Croon, PA-C  lovastatin (MEVACOR) 40 MG tablet Take 1 tablet (40 mg total) by mouth at bedtime. 09/10/17   Barrett, Evelene Croon, PA-C  mycophenolate (CELLCEPT) 250 MG capsule Take 250 mg by mouth at bedtime.     [provider]  mycophenolate (CELLCEPT) 500 MG tablet Take 500 mg by mouth every morning.     [provider]  nitroGLYCERIN (NITROSTAT) 0.4 MG SL tablet Place 1 tablet (0.4 mg total) under the tongue every 5 (five) minutes as needed for chest pain. 09/01/17 11/30/17  Leanor Kail, PA  oxybutynin (DITROPAN) 5 MG tablet Take 5 mg by mouth daily as needed for bladder spasms. 07/10/16   [provider]  predniSONE (DELTASONE) 1 MG tablet Take 1 mg by mouth daily with breakfast.    [provider]  predniSONE (DELTASONE) 5 MG tablet Take 5 mg by mouth daily with breakfast.    [provider]  ranitidine (ZANTAC) 150 MG tablet Take 150 mg by mouth every morning.     [provider]  sodium bicarbonate 650 MG tablet Take 1 tablet (650 mg total) by mouth 2 (two) times daily. 08/31/17 09/30/17  Modena Jansky, MD  ticagrelor (BRILINTA) 90 MG TABS tablet Take 1 tablet (90 mg total) by mouth 2 (two) times daily. 08/31/17   Hongalgi, Lenis Dickinson, MD    Family History No family history on file.  Social History Social History   Tobacco Use  . Smoking status: Never Smoker  . Smokeless tobacco: Never Used  Substance Use Topics  . Alcohol use: Never    Frequency: Never  . Drug use: Never      Allergies   Hydroxychloroquine and Codeine   Review of Systems Review of Systems  Constitutional: Negative for chills.  Gastrointestinal: Positive for diarrhea. Negative for abdominal pain and vomiting.  All other systems reviewed and are negative.    Physical Exam Updated Vital Signs BP 112/83   Pulse 93   Temp 98.1 F (36.7 C)   Resp 10   SpO2 100%   Physical Exam  Constitutional: She is oriented to person, place, and time. She appears well-developed and well-nourished. No distress.  HENT:  Head: Normocephalic and atraumatic.  Mouth/Throat: Oropharynx is clear and moist.  Eyes: Pupils are equal, round, and reactive to light. Conjunctivae  and EOM are normal.  Neck: Normal range of motion. Neck supple.  Cardiovascular: Normal rate, regular rhythm and normal heart sounds.  Pulmonary/Chest: Effort normal and breath sounds normal. No respiratory distress.  Abdominal: Soft. Normal appearance. She exhibits no distension. There is no tenderness.  Musculoskeletal: Normal range of motion. She exhibits no edema or deformity.  Neurological: She is alert and oriented to person, place, and time.  Skin: Skin is warm and dry.  Psychiatric: She has a normal mood and affect.  Nursing note and vitals reviewed.    ED Treatments / Results  Labs (all labs ordered are listed, but only abnormal results are displayed) Labs Reviewed  LIPASE, BLOOD - Abnormal; Notable for the following components:      Result Value   Lipase 55 (*)    All other components within normal limits  COMPREHENSIVE METABOLIC PANEL - Abnormal; Notable for the following components:   CO2 12 (*)    BUN 82 (*)    Creatinine, Ser 5.72 (*)    Calcium 8.3 (*)    AST 12 (*)    ALT 11 (*)    GFR calc non Af Amer 8 (*)    GFR calc Af Amer 9 (*)    All other components within normal limits  CBC - Abnormal; Notable for the following components:   WBC 15.2 (*)    RBC 3.69 (*)    Hemoglobin 10.5 (*)    HCT 33.5  (*)    Platelets 140 (*)    All other components within normal limits  C DIFFICILE QUICK SCREEN W PCR REFLEX  GASTROINTESTINAL PANEL BY PCR, STOOL (REPLACES STOOL CULTURE)  URINALYSIS, ROUTINE W REFLEX MICROSCOPIC  I-STAT BETA HCG BLOOD, ED ([facility], [facility], [facility] ONLY)    EKG EKG Interpretation  Date/Time:  Friday September 21 2017 10:36:07 EDT Ventricular Rate:  87 PR Interval:  130 QRS Duration: 74 QT Interval:  340 QTC Calculation: 409 R Axis:   -2 Text Interpretation:  Normal sinus rhythm ST & T wave abnormality, consider inferior ischemia ST & T wave abnormality, consider anterolateral ischemia Abnormal ECG Confirmed by Dene Gentry [phone]) on 09/21/2017 12:14:52 PM   Radiology No results found.  Procedures Procedures (including critical care time)  Medications Ordered in ED Medications  sodium chloride 0.9 % bolus 1,000 mL (1,000 mLs Intravenous New Bag/Given 09/21/17 1318)  acetaminophen (TYLENOL) tablet 650 mg (has no administration in time range)  allopurinol (ZYLOPRIM) tablet 100 mg (has no administration in time range)  amLODipine (NORVASC) tablet 10 mg (has no administration in time range)  aspirin chewable tablet 81 mg (has no administration in time range)  carvedilol (COREG) tablet 6.25 mg (has no administration in time range)  cloNIDine (CATAPRES) tablet 0.1 mg (has no administration in time range)  hydrALAZINE (APRESOLINE) tablet 25 mg (has no administration in time range)  pravastatin (PRAVACHOL) tablet 40 mg (has no administration in time range)  mycophenolate (CELLCEPT) capsule 250 mg (has no administration in time range)  mycophenolate (CELLCEPT) tablet 500 mg (has no administration in time range)  oxybutynin (DITROPAN) tablet 5 mg (has no administration in time range)  famotidine (PEPCID) tablet 20 mg (has no administration in time range)  sodium bicarbonate tablet 650 mg (has no administration in time range)  ticagrelor (BRILINTA) tablet 90 mg (has no  administration in time range)  0.9 %  sodium chloride infusion (has no administration in time range)  ondansetron (ZOFRAN) tablet 4 mg (has no administration in time range)  Or  ondansetron (ZOFRAN) injection 4 mg (has no administration in time range)  ondansetron (ZOFRAN) injection 4 mg (4 mg Intravenous Given 09/21/17 1318)     Initial Impression / Assessment and Plan / ED Course  I have reviewed the triage vital signs and the nursing notes.  Pertinent labs & imaging results that were available during my care of the patient were reviewed by me and considered in my medical decision making (see chart for details).     MDM  Screen complete  Patient is presenting for evaluation of diarrhea.  Patient with chronic kidney disease - labs today suggest acute on chronic renal failure.  Case discussed with nephrology on-call - Dr. Justin Mend -he is aware of the case and will follow as consult.  He agrees with initial ED management.  She will require admission for IV fluids and further work-up.  Case discussed with the hospitalist service who will see the patient for possible admission.  Final Clinical Impressions(s) / ED Diagnoses   Final diagnoses:  Diarrhea, unspecified type  Acute renal failure superimposed on chronic kidney disease, unspecified CKD stage, unspecified acute renal failure type [facility])    ED Discharge Orders    None       Valarie Merino, MD 09/21/17 1343

## 2017-09-21 NOTE — Telephone Encounter (Signed)
Message routed to [employer]

## 2017-09-21 NOTE — ED Notes (Signed)
Bed Assignment changed

## 2017-09-21 NOTE — ED Notes (Signed)
Admitting at bedside 

## 2017-09-21 NOTE — Consult Note (Signed)
[facility]  HISTORY AND PHYSICAL  Lindsey York is an 52 y.o. female.    Chief Complaint: diarrhea  HPI: Pt is a 62F with a longstanding history of SLE, CKD IV-V, HTN, and a recent NSTEMI s/p PCI 08/2016 who is now seen in consultation at the request of Dr. Jonnie Finner for eval and recs re: AKI on CKD.   Pt has had a week of nonbloody stools.  Accompanied by crampy abd pain.  Stools turned from normal color --> green --> yellow.  Unable to take good PO.  Came to hospital for eval.    In regards to recent NSTEMI, had PCI 5/28 and was going to do staged PCI but this was decided against in setting of rising Cr.   Baseline Cr is 3.4-3.9 and upon that discharge in may was up to 4.8.  On 6/7 was back down to 4.08 but yesterday was up to 5.3 in the setting of her diarrhea.    Pt takes Cellcept for lupus, was previously on cytoxan ~20 years ago and has been maintained on Cellcept for > 10 years.  Is allergic to Plaquenil.  Last biopsy 05/2017 showed diffuse scarring.  They were in the process of tapering the cellcept from a total daily dose of 1000 mg to 750 mg daily.  She has stopped cellcept last Monday in the setting of her diarrhea.    Denies f/c, n/v, HA, SOB.    PMH: Past Medical History:  Diagnosis Date  . Anemia   . CKD (chronic kidney disease), stage V ([place])    "[facility]ed at [facility]" (08/28/2017)  . Coronary artery disease   . Diarrhea 09/2017  . GERD (gastroesophageal reflux disease)   . High cholesterol   . History of gout    "on daily RX"" (08/28/2017)  . Hypertension   . NSTEMI (non-ST elevated myocardial infarction) ([place]) 08/26/2017  . Systemic lupus ([facility])    PSH: Past Surgical History:  Procedure Laterality Date  . BREAST SURGERY Left ~ 1978   "tumors taken out"  . CORONARY STENT INTERVENTION N/A 08/28/2017   Procedure: CORONARY STENT INTERVENTION;  Surgeon: Burnell Blanks, MD;  Location: [facility];  Service: Cardiovascular;  Laterality: N/A;   . HERNIA REPAIR    . LAPAROSCOPIC CHOLECYSTECTOMY    . LEFT HEART CATH AND CORONARY ANGIOGRAPHY N/A 08/28/2017   Procedure: LEFT HEART CATH AND CORONARY ANGIOGRAPHY;  Surgeon: Burnell Blanks, MD;  Location: [facility];  Service: Cardiovascular;  Laterality: N/A;  . MOUTH SURGERY     oral cavity biopsy  . TUBAL LIGATION    . UMBILICAL HERNIA REPAIR      Past Medical History:  Diagnosis Date  . Anemia   . CKD (chronic kidney disease), stage V ([place])    "followed at [facility]" (08/28/2017)  . Coronary artery disease   . Diarrhea 09/2017  . GERD (gastroesophageal reflux disease)   . High cholesterol   . History of gout    "on daily RX"" (08/28/2017)  . Hypertension   . NSTEMI (non-ST elevated myocardial infarction) ([place]) 08/26/2017  . Systemic lupus ([facility])     Medications:   Scheduled: . allopurinol  100 mg Oral Daily  . [START ON 09/22/2017] amLODipine  10 mg Oral Daily  . [START ON 09/22/2017] aspirin  81 mg Oral Daily  . [START ON 09/22/2017] carvedilol  6.25 mg Oral BID WC  . cholestyramine  4 g Oral TID AC  . [START ON 09/22/2017] cloNIDine  0.1 mg Oral Daily  . [START ON 09/22/2017] famotidine  20 mg Oral Daily  . [START ON 09/22/2017] hydrALAZINE  25 mg Oral 3 times per day  . mycophenolate  250 mg Oral QHS  . mycophenolate  500 mg Oral Daily  . pravastatin  40 mg Oral q1800  . [START ON 09/22/2017] predniSONE  1 mg Oral Q breakfast  . sodium bicarbonate  650 mg Oral BID  . ticagrelor  90 mg Oral BID    Medications Prior to Admission  Medication Sig Dispense Refill  . acetaminophen (TYLENOL) 325 MG tablet Take 650 mg by mouth every 6 (six) hours as needed for moderate pain.    Marland Kitchen allopurinol (ZYLOPRIM) 100 MG tablet Take 100 mg by mouth daily.    Marland Kitchen amLODipine (NORVASC) 10 MG tablet Take 10 mg by mouth daily.    Marland Kitchen aspirin 81 MG chewable tablet Chew 1 tablet (81 mg total) by mouth daily. 30 tablet 0  . benzonatate (TESSALON) 100 MG capsule Take 100 mg by mouth 2  (two) times daily as needed for cough.    . bevacizumab (AVASTIN) 400 MG/16ML SOLN Inject 1.25 mg as directed every 30 (thirty) days.    . calcitRIOL (ROCALTROL) 0.25 MCG capsule Take 0.25 mcg by mouth 3 (three) times a week. Monday, Wednesday, Friday  6  . carvedilol (COREG) 6.25 MG tablet Take 1 tablet (6.25 mg total) by mouth 2 (two) times daily with a meal. 60 tablet 0  . cloNIDine (CATAPRES) 0.1 MG tablet Take 1 tab at 8am, 4pm and midnight 90 tablet 0  . Darbepoetin Alfa-Polysorbate (ARANESP, ALB FREE, SURECLICK IJ) Inject 888 mg as directed every 14 (fourteen) days.     . fluticasone (FLONASE) 50 MCG/ACT nasal spray Place 1 spray into the nose daily.    . hydrALAZINE (APRESOLINE) 25 MG tablet Take 1 tablet (25 mg total) by mouth 3 (three) times daily. Take 1 tab at 8am, 4pm, and midnight 90 tablet 4  . lovastatin (MEVACOR) 40 MG tablet Take 1 tablet (40 mg total) by mouth at bedtime. 30 tablet 6  . mycophenolate (CELLCEPT) 250 MG capsule Take 250 mg by mouth at bedtime.     . mycophenolate (CELLCEPT) 500 MG tablet Take 500 mg by mouth every morning.     . nitroGLYCERIN (NITROSTAT) 0.4 MG SL tablet Place 1 tablet (0.4 mg total) under the tongue every 5 (five) minutes as needed for chest pain. 25 tablet 11  . oxybutynin (DITROPAN) 5 MG tablet Take 5 mg by mouth daily as needed for bladder spasms.    . predniSONE (DELTASONE) 1 MG tablet Take 1 mg by mouth daily with breakfast.    . predniSONE (DELTASONE) 5 MG tablet Take 5 mg by mouth daily with breakfast.    . ranitidine (ZANTAC) 150 MG tablet Take 150 mg by mouth every morning.     . sodium bicarbonate 650 MG tablet Take 1 tablet (650 mg total) by mouth 2 (two) times daily. 60 tablet 0  . ticagrelor (BRILINTA) 90 MG TABS tablet Take 1 tablet (90 mg total) by mouth 2 (two) times daily. 60 tablet 0  . furosemide (LASIX) 80 MG tablet Take 1 tablet (80 mg total) by mouth every other day. Take 1 tablet daily for 3 days and then go back to taking 1  tablet every other day. 30 tablet 0    ALLERGIES:   Allergies  Allergen Reactions  . Hydroxychloroquine Hives and Rash  . Codeine  FAM HX: History reviewed. No pertinent family history.  Social History:   reports that she has never smoked. She has never used smokeless tobacco. She reports that she does not drink alcohol or use drugs.  ROS: ROS: all systems reviewed and are negative except as per HPI  Blood pressure 131/86, pulse 88, temperature 99.5 F (37.5 C), temperature source Oral, resp. rate 18, SpO2 100 %. PHYSICAL EXAM: Physical Exam GEN: appears fatigued, NAD HEENT EOMI PERRL dry MM NECK no JVD PULM clear anteriorly CV RRR no m/r/g ABD soft, nontender, hyperactive BS EXT no LE edema NEURO AAO x # SKIN  Dry, some poor turgor  Results for orders placed or performed during the hospital encounter of 09/21/17 (from the past 48 hour(s))  Lipase, blood     Status: Abnormal   Collection Time: 09/21/17 10:35 AM  Result Value Ref Range   Lipase 55 (H) 11 - 51 U/L    Comment: Performed at [facility] [address] [place], [place] [address]  Comprehensive metabolic panel     Status: Abnormal   Collection Time: 09/21/17 10:35 AM  Result Value Ref Range   Sodium 137 135 - 145 mmol/L   Potassium 4.4 3.5 - 5.1 mmol/L   Chloride 111 101 - 111 mmol/L   CO2 12 (L) 22 - 32 mmol/L   Glucose, Bld 89 65 - 99 mg/dL   BUN 82 (H) 6 - 20 mg/dL   Creatinine, Ser 5.72 (H) 0.44 - 1.00 mg/dL   Calcium 8.3 (L) 8.9 - 10.3 mg/dL   Total Protein 6.8 6.5 - 8.1 g/dL   Albumin 3.6 3.5 - 5.0 g/dL   AST 12 (L) 15 - 41 U/L   ALT 11 (L) 14 - 54 U/L   Alkaline Phosphatase 67 38 - 126 U/L   Total Bilirubin 0.6 0.3 - 1.2 mg/dL   GFR calc non Af Amer 8 (L) >60 mL/min   GFR calc Af Amer 9 (L) >60 mL/min    Comment: (NOTE) The eGFR has been calculated using the CKD EPI equation. This calculation has not been validated in all clinical situations. eGFR's persistently <60 mL/min  signify possible Chronic Kidney Disease.    Anion gap 14 5 - 15    Comment: Performed at [facility] [address], [place], [place] [address]  CBC     Status: Abnormal   Collection Time: 09/21/17 10:35 AM  Result Value Ref Range   WBC 15.2 (H) 4.0 - 10.5 K/uL   RBC 3.69 (L) 3.87 - 5.11 MIL/uL   Hemoglobin 10.5 (L) 12.0 - 15.0 g/dL   HCT 33.5 (L) 36.0 - 46.0 %   MCV 90.8 78.0 - 100.0 fL   MCH 28.5 26.0 - 34.0 pg   MCHC 31.3 30.0 - 36.0 g/dL   RDW 14.1 11.5 - 15.5 %   Platelets 140 (L) 150 - 400 K/uL    Comment: Performed at [facility], [place] [address], [place], [place] [address]  I-Stat beta hCG blood, ED     Status: None   Collection Time: 09/21/17 10:54 AM  Result Value Ref Range   I-stat hCG, quantitative <5.0 <5 mIU/mL   Comment 3            Comment:   GEST. AGE      CONC.  (mIU/mL)   <=1 WEEK        5 - 50     2 WEEKS  50 - 500     3 WEEKS       100 - 10,000     4 WEEKS     1,000 - 30,000        FEMALE AND NON-PREGNANT FEMALE:     LESS THAN 5 mIU/mL   C difficile quick scan w PCR reflex     Status: None   Collection Time: 09/21/17  2:33 PM  Result Value Ref Range   C Diff antigen NEGATIVE NEGATIVE   C Diff toxin NEGATIVE NEGATIVE   C Diff interpretation No C. difficile detected.     Comment: Performed at [facility], [place] [address], [place], [place] [address]    No results found.  Assessment/Plan  1.  AKI on CKD V: without uremic symptoms.  In setting of poor PO intake and diarrhea.  I am hoping that we may be able to improve renal function with supportive care.  No indications for dialysis yet.  In regards to her lupus, I would not restart Cellcept.  She has global scarring on her biopsy so I don't see the need for further immunosuppressive therapy especially with severe diarrhea (which cellcept can cause/ exacerbate).  Agree with isotonic fluids- will change to bicarb gtt in setting of metabolic acidosis.  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  Recent cardiac intervention precludes active listing for transplant at this time.    3.  Diarrhea: per primary- C diff, GI pathogen panel.  Given lack of bloody diarrhea I doubt CMV colitis or something similar.    4.  Metabolic acidosis- changing fluids to isotonic bicarb  5.  Recent NSTEMI: on ASA/ brilinta.  Would not think that brilinta would cause such profound diarrhea as SE.    6.  Dispo: pending improvement in diarrhea/ renal function  Monique Gift 09/21/2017, 6:02 PM

## 2017-09-21 NOTE — H&P (Signed)
History and Physical    Lindsey York HBZ:[id number] DOB: 1966/01/01 DOA: 09/21/2017  PCP: System, Pcp Not In Patient coming from:home  Chief Complaint: persistent diarrhea  HPI: Lindsey York is a 52 y.o. female with medical history significant for lupus, htn, hyperlipidemia, non-stemi s/p cath and stents 3 weeks ago since emergency Department chief complaint persistent diarrhea. Initial evaluation reveals hypertension, kidney injury superimposed on chronic kidney disease stage IV. [facility] are asked to admit  Information is obtained from the chart and the patient. She states for the last 6 days she has had persistent watery stool. Does stated symptoms include intermittent abdominal bloating, excessive flatus and abdominal cramping bowel movements. Today she also developed nausea with one episode of emesis. She denies any recent antibiotics. She denies headache dizziness syncope or near-syncope. She denies chest pain palpitation shortness of breath lower extremity edema. She denies dysuria hematuria frequency or urgency. She denies melena bright red blood per rectum. She states she took over-the-counter Imodium with little relief.   ED Course: emergency department she's afebrile with a soft blood pressure and slightly tachycardic. She is nontoxic appearing She had several episodes of stool in the emergency department.   Review of Systems: As per HPI otherwise all other systems reviewed and are negative.   Ambulatory Status: ambulates independently is independent with ADLs  Past Medical History:  Diagnosis Date  . Anemia   . CKD (chronic kidney disease), stage V ([place])    "followed at [facility]" (08/28/2017)  . Coronary artery disease   . GERD (gastroesophageal reflux disease)   . High cholesterol   . History of gout    "on daily RX"" (08/28/2017)  . Hypertension   . NSTEMI (non-ST elevated myocardial infarction) ([place]) 08/26/2017  . Systemic lupus ([place])     Past Surgical  History:  Procedure Laterality Date  . BREAST SURGERY Left ~ 1978   "tumors taken out"  . CORONARY STENT INTERVENTION N/A 08/28/2017   Procedure: CORONARY STENT INTERVENTION;  Surgeon: Burnell Blanks, MD;  Location: [place] [facility];  Service: Cardiovascular;  Laterality: N/A;  . HERNIA REPAIR    . LAPAROSCOPIC CHOLECYSTECTOMY    . LEFT HEART CATH AND CORONARY ANGIOGRAPHY N/A 08/28/2017   Procedure: LEFT HEART CATH AND CORONARY ANGIOGRAPHY;  Surgeon: Burnell Blanks, MD;  Location: [place] [facility];  Service: Cardiovascular;  Laterality: N/A;  . MOUTH SURGERY     oral cavity biopsy  . TUBAL LIGATION    . UMBILICAL HERNIA REPAIR      Social History   Socioeconomic History  . Marital status: Divorced    Spouse name: Not on file  . Number of children: Not on file  . Years of education: Not on file  . Highest education level: Not on file  Occupational History  . Not on file  Social Needs  . Financial resource strain: Not on file  . Food insecurity:    Worry: Not on file    Inability: Not on file  . Transportation needs:    Medical: Not on file    Non-medical: Not on file  Tobacco Use  . Smoking status: Never Smoker  . Smokeless tobacco: Never Used  Substance and Sexual Activity  . Alcohol use: Never    Frequency: Never  . Drug use: Never  . Sexual activity: Yes  Lifestyle  . Physical activity:    Days per week: Not on file    Minutes per session: Not on file  . Stress: Not  on file  Relationships  . Social connections:    Talks on phone: Not on file    Gets together: Not on file    Attends religious service: Not on file    Active member of club or organization: Not on file    Attends meetings of clubs or organizations: Not on file    Relationship status: Not on file  . Intimate partner violence:    Fear of current or ex partner: Not on file    Emotionally abused: Not on file    Physically abused: Not on file    Forced sexual activity: Not on  file  Other Topics Concern  . Not on file  Social History Narrative  . Not on file    Allergies  Allergen Reactions  . Hydroxychloroquine Hives and Rash  . Codeine     No family history on file.  Prior to Admission medications   Medication Sig Start Date End Date Taking? Authorizing Provider  acetaminophen (TYLENOL) 325 MG tablet Take 650 mg by mouth every 6 (six) hours as needed for moderate pain.    [provider]  allopurinol (ZYLOPRIM) 100 MG tablet Take 100 mg by mouth daily.    [provider]  amLODipine (NORVASC) 10 MG tablet Take 10 mg by mouth daily.    [provider]  aspirin 81 MG chewable tablet Chew 1 tablet (81 mg total) by mouth daily. 09/01/17   Hongalgi, Lenis Dickinson, MD  carvedilol (COREG) 6.25 MG tablet Take 1 tablet (6.25 mg total) by mouth 2 (two) times daily with a meal. 08/31/17   Hongalgi, Lenis Dickinson, MD  cloNIDine (CATAPRES) 0.1 MG tablet Take 1 tab at 8am, 4pm and midnight 09/10/17   Barrett, Evelene Croon, PA-C  Darbepoetin Alfa-Polysorbate (ARANESP, ALB FREE, SURECLICK IJ) Inject 956 mg as directed every 14 (fourteen) days.     [provider]  furosemide (LASIX) 80 MG tablet Take 1 tablet (80 mg total) by mouth every other day. Take 1 tablet daily for 3 days and then go back to taking 1 tablet every other day. 08/31/17   Hongalgi, Lenis Dickinson, MD  hydrALAZINE (APRESOLINE) 25 MG tablet Take 1 tablet (25 mg total) by mouth 3 (three) times daily. Take 1 tab at 8am, 4pm, and midnight 09/10/17   Barrett, Evelene Croon, PA-C  lovastatin (MEVACOR) 40 MG tablet Take 1 tablet (40 mg total) by mouth at bedtime. 09/10/17   Barrett, Evelene Croon, PA-C  mycophenolate (CELLCEPT) 250 MG capsule Take 250 mg by mouth at bedtime.     [provider]  mycophenolate (CELLCEPT) 500 MG tablet Take 500 mg by mouth every morning.     [provider]  nitroGLYCERIN (NITROSTAT) 0.4 MG SL tablet Place 1 tablet (0.4 mg total) under the tongue every 5 (five)  minutes as needed for chest pain. 09/01/17 11/30/17  Leanor Kail, PA  oxybutynin (DITROPAN) 5 MG tablet Take 5 mg by mouth daily as needed for bladder spasms. 07/10/16   [provider]  predniSONE (DELTASONE) 1 MG tablet Take 1 mg by mouth daily with breakfast.    [provider]  predniSONE (DELTASONE) 5 MG tablet Take 5 mg by mouth daily with breakfast.    [provider]  ranitidine (ZANTAC) 150 MG tablet Take 150 mg by mouth every morning.     [provider]  sodium bicarbonate 650 MG tablet Take 1 tablet (650 mg total) by mouth 2 (two) times daily. 08/31/17 09/30/17  Hongalgi,  Lenis Dickinson, MD  ticagrelor (BRILINTA) 90 MG TABS tablet Take 1 tablet (90 mg total) by mouth 2 (two) times daily. 08/31/17   Modena Jansky, MD    Physical Exam: Vitals:   09/21/17 1205 09/21/17 1317 09/21/17 1318 09/21/17 1330  BP:  112/83  114/84  Pulse: 91 95 93 93  Resp: 13  10 14   Temp:      SpO2: 98% 100% 100% 100%     General:  Appears calm and comfortable in no acute distress Eyes:  PERRL, EOMI, normal lids, iris ENT:  grossly normal hearing, lips & tongue, his membranes of her mouth are pink slightly dry Neck:  no LAD, masses or thyromegaly Cardiovascular:  RRR, no m/r/g. No LE edema.  Respiratory:  CTA bilaterally, no w/r/r. Normal respiratory effort. Abdomen:  soft, ntnd, positive bowel sounds but sluggish no guarding or rebounding Skin:  no rash or induration seen on limited exam Musculoskeletal:  grossly normal tone BUE/BLE, good ROM, no bony abnormality Psychiatric:  grossly normal mood and affect, speech fluent and appropriate, AOx3 Neurologic:  CN 2-12 grossly intact, moves all extremities in coordinated fashion, sensation intact  Labs on Admission: I have personally reviewed following labs and imaging studies  CBC: Recent Labs  Lab 09/21/17 1035  WBC 15.2*  HGB 10.5*  HCT 33.5*  MCV 90.8  PLT 737*   Basic Metabolic Panel: Recent Labs  Lab  09/21/17 1035  NA 137  K 4.4  CL 111  CO2 12*  GLUCOSE 89  BUN 82*  CREATININE 5.72*  CALCIUM 8.3*   GFR: Estimated Creatinine Clearance: 11.2 mL/min (A) (by C-G formula based on SCr of 5.72 mg/dL (H)). Liver Function Tests: Recent Labs  Lab 09/21/17 1035  AST 12*  ALT 11*  ALKPHOS 67  BILITOT 0.6  PROT 6.8  ALBUMIN 3.6   Recent Labs  Lab 09/21/17 1035  LIPASE 55*   No results for input(s): AMMONIA in the last 168 hours. Coagulation Profile: No results for input(s): INR, PROTIME in the last 168 hours. Cardiac Enzymes: No results for input(s): CKTOTAL, CKMB, CKMBINDEX, TROPONINI in the last 168 hours. BNP (last 3 results) No results for input(s): PROBNP in the last 8760 hours. HbA1C: No results for input(s): HGBA1C in the last 72 hours. CBG: No results for input(s): GLUCAP in the last 168 hours. Lipid Profile: No results for input(s): CHOL, HDL, LDLCALC, TRIG, CHOLHDL, LDLDIRECT in the last 72 hours. Thyroid Function Tests: No results for input(s): TSH, T4TOTAL, FREET4, T3FREE, THYROIDAB in the last 72 hours. Anemia Panel: No results for input(s): VITAMINB12, FOLATE, FERRITIN, TIBC, IRON, RETICCTPCT in the last 72 hours. Urine analysis:    Component Value Date/Time   COLORURINE YELLOW 08/26/2017 2316   APPEARANCEUR HAZY (A) 08/26/2017 2316   LABSPEC 1.020 08/26/2017 2316   PHURINE 6.5 08/26/2017 2316   GLUCOSEU 100 (A) 08/26/2017 2316   HGBUR MODERATE (A) 08/26/2017 2316   BILIRUBINUR NEGATIVE 08/26/2017 2316   KETONESUR NEGATIVE 08/26/2017 2316   PROTEINUR >300 (A) 08/26/2017 2316   UROBILINOGEN 0.2 03/24/2014 1420   NITRITE NEGATIVE 08/26/2017 2316   LEUKOCYTESUR NEGATIVE 08/26/2017 2316    Creatinine Clearance: Estimated Creatinine Clearance: 11.2 mL/min (A) (by C-G formula based on SCr of 5.72 mg/dL (H)).  Sepsis Labs: @LABRCNTIP (procalcitonin:4,lacticidven:4) )No results found for this or any previous visit (from the past 240 hour(s)).    Radiological Exams on Admission: No results found.  EKG: Independently reviewed. Normal sinus rhythm ST & T wave abnormality, consider  inferior ischemia ST & T wave abnormality, consider anterolateral ischemia Abnormal ECG  Assessment/Plan Principal Problem:   Acute kidney injury superimposed on CKD (HCC) Active Problems:   Diarrhea   Hypertension   NSTEMI (non-ST elevated myocardial infarction) (HCC)   Systemic lupus (HCC)   Metabolic acidosis   #1. Acute kidney injury superimposed on chronic kidney disease stage IV. Likely related to volume depletion secondary to persistent diarrhea in the setting of recent contrast for cardiac cath and difficult to control BP. On admission creatinine 5.7 up from 3.9 last month. Of note, chart reviewed she is followed by transplant team at [facility] -admit -IV fluids -Hold nephrotoxins -Monitor urine output -Await nephrology recommendations  #2. Persistent diarrhea. Etiology unclear. No recent antibiotic use. Stool quite watery no melena no hematochezia. Had 3 episodes while waiting in the emergency department. -Gentle IV fluids -Stool sample for C. Difficile and GI pathogen panel -Monitor urine output -Replete any electrolytes  #3. Metabolic acidosis. Related to above. -See #2 -monitor  #4.hypertension.fair control in the emergency department -Continue home meds  5.Lupus. Follows with rheumatology a [facility]. Home meds include cellcept and prednisone -continue home meds  #6.non-stemi. Last month. Was seen by cardiology. Chart review indicates placed on appropriate medical care which include aspirin, Coreg, statin. When cardiac cath May 28 with successful PCI/DES x1. She has residual disease in the RCA with plans for staged intervention but this was canceled due to worsening renal insufficiency. No chest pain. -Continue home meds     DVT prophylaxis: scd Code Status: full  Family Communication: husband at bedside  Disposition Plan:  home  Consults called: [place] [facility]  Admission status: inpatient    Radene Gunning MD [facility]  If 7PM-7AM, please contact night-coverage [email or web address] Password [facility]  09/21/2017, 2:13 PM

## 2017-09-21 NOTE — ED Triage Notes (Signed)
Pt arrived from home via [facility] with c/o abdominal pain and diarrhea X5 days. Reports that she was here 3 weeks prior for stent placement. EMS reports BP of 90 palpated upon arrival, given 400 NS IV PTA. Last BP 108/78.

## 2017-09-21 NOTE — Telephone Encounter (Signed)
New Message    Jasmine with [facility] is calling to see if Dr. Gwenlyn Found received the orders for the patient to participate in cardiac rehab. Please call to discuss.

## 2017-09-21 NOTE — ED Notes (Signed)
Pt denies  CP, denies SOB, c/o abd pain with diarrhea and decrease appetite.

## 2017-09-21 NOTE — Telephone Encounter (Signed)
Jasmine called again from [employer]. Stated she needed diagnosis codes for [facility] referral.  Pt was referred by Rosaria Ferries, PA for NSTEMI/CAD. Gave ICD 10 code to [place]. She verbalized thanks.

## 2017-09-22 ENCOUNTER — Inpatient Hospital Stay (HOSPITAL_COMMUNITY): Payer: Medicare HMO

## 2017-09-22 DIAGNOSIS — I214 Non-ST elevation (NSTEMI) myocardial infarction: Secondary | ICD-10-CM

## 2017-09-22 LAB — GASTROINTESTINAL PANEL BY PCR, STOOL (REPLACES STOOL CULTURE)
ADENOVIRUS F40/41: NOT DETECTED
ASTROVIRUS: NOT DETECTED
Campylobacter species: NOT DETECTED
Cryptosporidium: NOT DETECTED
Cyclospora cayetanensis: NOT DETECTED
ENTEROPATHOGENIC E COLI (EPEC): DETECTED — AB
ENTEROTOXIGENIC E COLI (ETEC): NOT DETECTED
Entamoeba histolytica: NOT DETECTED
Enteroaggregative E coli (EAEC): NOT DETECTED
Giardia lamblia: NOT DETECTED
NOROVIRUS GI/GII: NOT DETECTED
PLESIMONAS SHIGELLOIDES: NOT DETECTED
Rotavirus A: NOT DETECTED
SAPOVIRUS (I, II, IV, AND V): NOT DETECTED
SHIGA LIKE TOXIN PRODUCING E COLI (STEC): NOT DETECTED
Salmonella species: NOT DETECTED
Shigella/Enteroinvasive E coli (EIEC): NOT DETECTED
Vibrio cholerae: NOT DETECTED
Vibrio species: NOT DETECTED
Yersinia enterocolitica: NOT DETECTED

## 2017-09-22 LAB — CBC
HEMATOCRIT: 35.8 % — AB (ref 36.0–46.0)
Hemoglobin: 11.3 g/dL — ABNORMAL LOW (ref 12.0–15.0)
MCH: 28.3 pg (ref 26.0–34.0)
MCHC: 31.6 g/dL (ref 30.0–36.0)
MCV: 89.5 fL (ref 78.0–100.0)
PLATELETS: 141 10*3/uL — AB (ref 150–400)
RBC: 4 MIL/uL (ref 3.87–5.11)
RDW: 14 % (ref 11.5–15.5)
WBC: 7.4 10*3/uL (ref 4.0–10.5)

## 2017-09-22 LAB — COMPREHENSIVE METABOLIC PANEL
ALT: 11 U/L — AB (ref 14–54)
AST: 11 U/L — AB (ref 15–41)
Albumin: 3.3 g/dL — ABNORMAL LOW (ref 3.5–5.0)
Alkaline Phosphatase: 61 U/L (ref 38–126)
Anion gap: 14 (ref 5–15)
BUN: 71 mg/dL — AB (ref 6–20)
CHLORIDE: 104 mmol/L (ref 101–111)
CO2: 19 mmol/L — AB (ref 22–32)
CREATININE: 5.22 mg/dL — AB (ref 0.44–1.00)
Calcium: 7.9 mg/dL — ABNORMAL LOW (ref 8.9–10.3)
GFR, EST AFRICAN AMERICAN: 10 mL/min — AB (ref >60)
GFR, EST NON AFRICAN AMERICAN: 9 mL/min — AB (ref >60)
Glucose, Bld: 105 mg/dL — ABNORMAL HIGH (ref 65–99)
POTASSIUM: 3.4 mmol/L — AB (ref 3.5–5.1)
SODIUM: 137 mmol/L (ref 135–145)
Total Bilirubin: 0.4 mg/dL (ref 0.3–1.2)
Total Protein: 6.4 g/dL — ABNORMAL LOW (ref 6.5–8.1)

## 2017-09-22 MED ORDER — HYDROCODONE-HOMATROPINE 5-1.5 MG/5ML PO SYRP
5.0000 mL | ORAL_SOLUTION | Freq: Four times a day (QID) | ORAL | Status: DC | PRN
  Administered 2017-09-22 (×2): 5 mL via ORAL
  Filled 2017-09-22 (×2): qty 5

## 2017-09-22 MED ORDER — POTASSIUM CHLORIDE 20 MEQ PO PACK
20.0000 meq | PACK | Freq: Once | ORAL | Status: AC
  Administered 2017-09-22: 20 meq via ORAL
  Filled 2017-09-22: qty 1

## 2017-09-22 MED ORDER — GUAIFENESIN-DM 100-10 MG/5ML PO SYRP
5.0000 mL | ORAL_SOLUTION | ORAL | Status: DC | PRN
  Administered 2017-09-22: 5 mL via ORAL
  Filled 2017-09-22 (×2): qty 5

## 2017-09-22 NOTE — Progress Notes (Signed)
[facility] Progress Note    Assessment/ Plan:   1.  AKI on CKD V: without uremic symptoms.  In setting of poor PO intake and diarrhea.  I am hoping that we may be able to improve renal function with supportive care.  No indications for dialysis yet.  In regards to her lupus, I would not restart Cellcept.  She has global scarring on her biopsy so I don't see the need for further immunosuppressive therapy especially with severe diarrhea (which cellcept can cause/ exacerbate).  Cr slightly improving with isotonic bicarb.  Had more diarrhea- continue fluids.  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  Recent cardiac intervention precludes active listing for transplant at this time.  Continue pred 1 mg daily.  No indication for stress-dose steroids    3.  Diarrhea: per primary- C diff ngg, GI pathogen panel pending.  Given lack of bloody diarrhea I doubt CMV colitis or something similar.    4.  Metabolic acidosis- improving  5.  Hypokalemia: will conservatively replete  6.  Recent NSTEMI: on ASA/ brilinta.  Would not think that brilinta would cause such profound diarrhea as SE.    7.  Dispo: pending improvement in diarrhea/ renal function    Subjective:    Had 10 BM yesterday.  Reporting cough and congestion.  C diff neg, GI pathogen panel pending   Objective:   BP (!) 141/102 (BP Location: Right Arm)   Pulse (!) 104   Temp 98.7 F (37.1 C) (Oral)   Resp 18   Ht 5\' 3"  (1.6 m)   Wt 78 kg (171 lb 15.3 oz)   SpO2 95%   BMI 30.46 kg/m   Intake/Output Summary (Last 24 hours) at 09/22/2017 1308 Last data filed at 09/22/2017 0938 Gross per 24 hour  Intake 1609.81 ml  Output 100 ml  Net 1509.81 ml   Weight change:   Physical Exam: GEN: appears fatigued, NAD HEENT EOMI PERRL MMM NECK no JVD PULM clear anteriorly CV RRR no m/r/g ABD soft, nontender, hyperactive BS EXT no LE edema NEURO AAO x 3 SKIN  Dry, better  skin turgor    Imaging: Dg Chest 2 View  Result Date: 09/22/2017 CLINICAL DATA:  Cough. EXAM: CHEST - 2 VIEW COMPARISON:  Chest x-ray dated Aug 26, 2017. FINDINGS: Stable borderline cardiomegaly. Normal pulmonary vascularity. No focal consolidation, pleural effusion, or pneumothorax. No acute osseous abnormality. IMPRESSION: No active cardiopulmonary disease. Electronically Signed   By: Titus Dubin M.D.   On: 09/22/2017 11:55    Labs: BMET Recent Labs  Lab 09/21/17 1035 09/22/17 0515  NA 137 137  K 4.4 3.4*  CL 111 104  CO2 12* 19*  GLUCOSE 89 105*  BUN 82* 71*  CREATININE 5.72* 5.22*  CALCIUM 8.3* 7.9*   CBC Recent Labs  Lab 09/21/17 1035 09/22/17 0515  WBC 15.2* 7.4  HGB 10.5* 11.3*  HCT 33.5* 35.8*  MCV 90.8 89.5  PLT 140* 141*    Medications:    . allopurinol  100 mg Oral Daily  . amLODipine  10 mg Oral Daily  . aspirin  81 mg Oral Daily  . carvedilol  6.25 mg Oral BID WC  . cholestyramine  4 g Oral TID AC  . cloNIDine  0.1 mg Oral Daily  . famotidine  20 mg Oral Daily  . hydrALAZINE  25 mg Oral 3 times per day  . pravastatin  40 mg Oral  q1800  . predniSONE  1 mg Oral Q breakfast  . sodium bicarbonate  650 mg Oral BID  . ticagrelor  90 mg Oral BID      Madelon Lips MD 09/22/2017, 1:08 PM

## 2017-09-22 NOTE — Progress Notes (Signed)
Patient ID: Lindsey York, female   DOB: 09-25-1965, 52 y.o.   MRN: [id number]                                                                PROGRESS NOTE                                                                                                                                                                                                             Patient Demographics:    Lindsey York, is a 52 y.o. female, DOB - 31-Aug-1965, RAQ:[id number]  Admit date - 09/21/2017   Admitting Physician Roney Jaffe, MD  Outpatient Primary MD for the patient is System, Pcp Not In  LOS - 1  Outpatient Specialists:     Chief Complaint  Patient presents with  . Abdominal Pain       Brief Narrative    52 y.o. female with medical history significant for lupus, htn, hyperlipidemia, non-stemi s/p cath and stents 3 weeks ago since emergency Department chief complaint persistent diarrhea. Initial evaluation reveals hypertension, kidney injury superimposed on chronic kidney disease stage IV. [facility] are asked to admit  Information is obtained from the chart and the patient. She states for the last 6 days she has had persistent watery stool. Does stated symptoms include intermittent abdominal bloating, excessive flatus and abdominal cramping bowel movements. Today she also developed nausea with one episode of emesis. She denies any recent antibiotics. She denies headache dizziness syncope or near-syncope. She denies chest pain palpitation shortness of breath lower extremity edema. She denies dysuria hematuria frequency or urgency. She denies melena bright red blood per rectum. She states she took over-the-counter Imodium with little relief.   ED Course: emergency department she's afebrile with a soft blood pressure and slightly tachycardic. She is nontoxic appearing She had several episodes of stool in the emergency department.      Subjective:    Lindsey York today has diarrhea  still but seems to be improving. Had 10 bm yesterday.  Pt doesn't like the taste of cholestyramine but is tolerating this.  Slight cough w yellow sputum for the past 1 week. Pt thinks getting better. Denies fever, chills, cp, palp, sob, abd pain, brbpr, black stool.      Assessment  & Plan :  Principal Problem:   Acute kidney injury superimposed on CKD [facility]) Active Problems:   NSTEMI (non-ST elevated myocardial infarction) ([place])   Hypertension   Systemic lupus (HCC)   Diarrhea   Metabolic acidosis   ARF on CRF (ckd stage4) Secondary to dehydration secondary to diarrhea Continue with bicarb gtt as per nephrology  Diarrhea (C. Diff negative) GI pathogen panel pending Agree with cholestyramine Agree with imodium prn Agree with holding cellcept No prior colonoscopy  If persistent consider GI consult  Metabolic acidosis secondary to diarrhea Cont bicarb Gtt  Hypokalemia (very mild) Likely due to bicarb Gtt Check cmp in am  Cough CXR  Hycodan  Hypertension Cont carvedilol 25mg  po bid Cont clonidine 0.1mg  po qday Cont hydralazine 25mg  po tid  Hyperlipidemia Lovastatin=> pravastatin 40mg  po qhs  Lupus CKD stage4 DC Cellcept as above due to diarrhea, and ARF Follows with rheumatology/ nephrology at [place] prednisone 1mg  po qday  CAD h/o Non-stemi. Last month.  Seen by cardiology.  Chart review indicates placed on appropriate medical care which include aspirin, Coreg, statin. When cardiac cath May 28 with successful PCI/DES x1. She has residual disease in the RCA with plans for staged intervention but this was canceled due to worsening renal insufficiency. No chest pain. Continue home medication        Code Status :  FULL CODE  Family Communication  : w patient  Disposition Plan  : home  Barriers For Discharge :   Consults  :  Renal   Procedures  : none  DVT Prophylaxis  :  Heparin - SCDs   Lab Results  Component Value Date    PLT 141 (L) 09/22/2017    Antibiotics  :  none  Anti-infectives (From admission, onward)   None        Objective:   Vitals:   09/21/17 2042 09/22/17 0436 09/22/17 0438 09/22/17 0751  BP: (!) 127/96  (!) 133/96 (!) 141/102  Pulse: 92  (!) 107 (!) 104  Resp: 18  18 18   Temp: 98.6 F (37 C)  99.2 F (37.3 C) 98.7 F (37.1 C)  TempSrc: Oral  Oral Oral  SpO2: 98%  99% 95%  Weight:  78 kg (171 lb 15.3 oz)    Height:  5\' 3"  (1.6 m)      Wt Readings from Last 3 Encounters:  09/22/17 78 kg (171 lb 15.3 oz)  09/10/17 76.7 kg (169 lb)  08/31/17 80 kg (176 lb 5.9 oz)     Intake/Output Summary (Last 24 hours) at 09/22/2017 0908 Last data filed at 09/22/2017 9937 Gross per 24 hour  Intake 1609.81 ml  Output 100 ml  Net 1509.81 ml     Physical Exam  Awake Alert, Oriented X 3, No new F.N deficits, Normal affect [place].AT,PERRAL Supple Neck,No JVD, No cervical lymphadenopathy appriciated.  Symmetrical Chest wall movement, Good air movement bilaterally, CTAB RRR,No Gallops,Rubs or new Murmurs, No Parasternal Heave +ve B.Sounds, Abd Soft, No tenderness, No organomegaly appriciated, No rebound - guarding or rigidity. No Cyanosis, Clubbing or edema, No new Rash or bruise      Data Review:    CBC Recent Labs  Lab 09/21/17 1035 09/22/17 0515  WBC 15.2* 7.4  HGB 10.5* 11.3*  HCT 33.5* 35.8*  PLT 140* 141*  MCV 90.8 89.5  MCH 28.5 28.3  MCHC 31.3 31.6  RDW 14.1 14.0    Chemistries  Recent Labs  Lab 09/21/17 1035 09/22/17 0515  NA 137 137  K  4.4 3.4*  CL 111 104  CO2 12* 19*  GLUCOSE 89 105*  BUN 82* 71*  CREATININE 5.72* 5.22*  CALCIUM 8.3* 7.9*  AST 12* 11*  ALT 11* 11*  ALKPHOS 67 61  BILITOT 0.6 0.4   ------------------------------------------------------------------------------------------------------------------ No results for input(s): CHOL, HDL, LDLCALC, TRIG, CHOLHDL, LDLDIRECT in the last 72 hours.  Lab Results  Component Value Date   HGBA1C  5.4 08/29/2017   ------------------------------------------------------------------------------------------------------------------ No results for input(s): TSH, T4TOTAL, T3FREE, THYROIDAB in the last 72 hours.  Invalid input(s): FREET3 ------------------------------------------------------------------------------------------------------------------ No results for input(s): VITAMINB12, FOLATE, FERRITIN, TIBC, IRON, RETICCTPCT in the last 72 hours.  Coagulation profile No results for input(s): INR, PROTIME in the last 168 hours.  No results for input(s): DDIMER in the last 72 hours.  Cardiac Enzymes No results for input(s): CKMB, TROPONINI, MYOGLOBIN in the last 168 hours.  Invalid input(s): CK ------------------------------------------------------------------------------------------------------------------ No results found for: BNP  Inpatient Medications  Scheduled Meds: . allopurinol  100 mg Oral Daily  . amLODipine  10 mg Oral Daily  . aspirin  81 mg Oral Daily  . carvedilol  6.25 mg Oral BID WC  . cholestyramine  4 g Oral TID AC  . cloNIDine  0.1 mg Oral Daily  . famotidine  20 mg Oral Daily  . hydrALAZINE  25 mg Oral 3 times per day  . pravastatin  40 mg Oral q1800  . predniSONE  1 mg Oral Q breakfast  . sodium bicarbonate  650 mg Oral BID  . ticagrelor  90 mg Oral BID   Continuous Infusions: .  sodium bicarbonate (isotonic) infusion in sterile water 100 mL/hr at 09/22/17 0636   PRN Meds:.acetaminophen, diphenoxylate-atropine, guaiFENesin-dextromethorphan, ondansetron **OR** ondansetron (ZOFRAN) IV, oxybutynin  Micro Results Recent Results (from the past 240 hour(s))  C difficile quick scan w PCR reflex     Status: None   Collection Time: 09/21/17  2:33 PM  Result Value Ref Range Status   C Diff antigen NEGATIVE NEGATIVE Final   C Diff toxin NEGATIVE NEGATIVE Final   C Diff interpretation No C. difficile detected.  Final    Comment: Performed at [facility], [place] [address] [place], [place] [address]    Radiology Reports Dg Chest 2 View  Result Date: 08/26/2017 CLINICAL DATA:  Chest pain EXAM: CHEST - 2 VIEW COMPARISON:  July 30, 2016 FINDINGS: There is no edema or consolidation. Heart is borderline enlarged with pulmonary vascularity normal. No adenopathy. No bone lesions. IMPRESSION: Heart borderline enlarged.  No edema or consolidation. Electronically Signed   By: Lowella Grip III M.D.   On: 08/26/2017 21:45    Time Spent in minutes  30   Jani Gravel M.D on 09/22/2017 at 9:08 AM  Between 7am to 7pm - Pager - [phone]    After 7pm go to [email or web address] - password [facility]  [facility] -  Office  [phone]

## 2017-09-23 ENCOUNTER — Inpatient Hospital Stay (HOSPITAL_COMMUNITY): Payer: Medicare HMO

## 2017-09-23 DIAGNOSIS — K591 Functional diarrhea: Secondary | ICD-10-CM

## 2017-09-23 LAB — CBC
HCT: 32 % — ABNORMAL LOW (ref 36.0–46.0)
HEMOGLOBIN: 10 g/dL — AB (ref 12.0–15.0)
MCH: 27.9 pg (ref 26.0–34.0)
MCHC: 31.3 g/dL (ref 30.0–36.0)
MCV: 89.4 fL (ref 78.0–100.0)
PLATELETS: 121 10*3/uL — AB (ref 150–400)
RBC: 3.58 MIL/uL — ABNORMAL LOW (ref 3.87–5.11)
RDW: 13.5 % (ref 11.5–15.5)
WBC: 6 10*3/uL (ref 4.0–10.5)

## 2017-09-23 LAB — COMPREHENSIVE METABOLIC PANEL
ALBUMIN: 3 g/dL — AB (ref 3.5–5.0)
ALK PHOS: 51 U/L (ref 38–126)
ALT: 10 U/L — AB (ref 14–54)
AST: 11 U/L — ABNORMAL LOW (ref 15–41)
Anion gap: 14 (ref 5–15)
BUN: 60 mg/dL — ABNORMAL HIGH (ref 6–20)
CALCIUM: 7.1 mg/dL — AB (ref 8.9–10.3)
CHLORIDE: 95 mmol/L — AB (ref 101–111)
CO2: 30 mmol/L (ref 22–32)
CREATININE: 6.07 mg/dL — AB (ref 0.44–1.00)
GFR calc Af Amer: 8 mL/min — ABNORMAL LOW (ref >60)
GFR calc non Af Amer: 7 mL/min — ABNORMAL LOW (ref >60)
GLUCOSE: 89 mg/dL (ref 65–99)
Potassium: 2.8 mmol/L — ABNORMAL LOW (ref 3.5–5.1)
SODIUM: 139 mmol/L (ref 135–145)
Total Bilirubin: 0.5 mg/dL (ref 0.3–1.2)
Total Protein: 5.9 g/dL — ABNORMAL LOW (ref 6.5–8.1)

## 2017-09-23 MED ORDER — PROMETHAZINE HCL 25 MG/ML IJ SOLN
12.5000 mg | Freq: Four times a day (QID) | INTRAMUSCULAR | Status: DC | PRN
  Administered 2017-09-23 – 2017-09-25 (×3): 12.5 mg via INTRAVENOUS
  Filled 2017-09-23 (×4): qty 1

## 2017-09-23 MED ORDER — POTASSIUM CHLORIDE 20 MEQ PO PACK
40.0000 meq | PACK | Freq: Two times a day (BID) | ORAL | Status: DC
  Filled 2017-09-23: qty 2

## 2017-09-23 MED ORDER — POTASSIUM CHLORIDE 20 MEQ PO PACK
40.0000 meq | PACK | Freq: Once | ORAL | Status: AC
  Administered 2017-09-23: 40 meq via ORAL
  Filled 2017-09-23: qty 2

## 2017-09-23 MED ORDER — SODIUM CHLORIDE 0.9 % IV SOLN
INTRAVENOUS | Status: DC
  Administered 2017-09-23: 13:00:00 via INTRAVENOUS

## 2017-09-23 MED ORDER — GUAIFENESIN 100 MG/5ML PO SOLN
10.0000 mL | ORAL | Status: AC | PRN
  Administered 2017-09-23 – 2017-09-24 (×2): 200 mg via ORAL
  Filled 2017-09-23 (×2): qty 5
  Filled 2017-09-23 (×2): qty 10

## 2017-09-23 MED ORDER — CIPROFLOXACIN HCL 500 MG PO TABS
500.0000 mg | ORAL_TABLET | Freq: Every day | ORAL | Status: DC
  Administered 2017-09-23 – 2017-09-26 (×4): 500 mg via ORAL
  Filled 2017-09-23 (×4): qty 1

## 2017-09-23 MED ORDER — GUAIFENESIN ER 600 MG PO TB12
600.0000 mg | ORAL_TABLET | Freq: Two times a day (BID) | ORAL | Status: DC
  Administered 2017-09-23 – 2017-09-26 (×7): 600 mg via ORAL
  Filled 2017-09-23 (×7): qty 1

## 2017-09-23 MED ORDER — POTASSIUM CHLORIDE 20 MEQ PO PACK: 40.0000 meq | PACK | Freq: Two times a day (BID) | ORAL | Status: DC

## 2017-09-23 MED ORDER — BENZONATATE 100 MG PO CAPS
100.0000 mg | ORAL_CAPSULE | Freq: Three times a day (TID) | ORAL | Status: DC
  Administered 2017-09-23 – 2017-09-26 (×10): 100 mg via ORAL
  Filled 2017-09-23 (×10): qty 1

## 2017-09-23 NOTE — Progress Notes (Signed)
[facility] Progress Note    Assessment/ Plan:   1.  AKI on CKD V: without uremic symptoms.  In setting of poor PO intake and diarrhea.  I am hoping that we may be able to improve renal function with supportive care.  No indications for dialysis yet.  In regards to her lupus, I would not restart Cellcept.  She has global scarring on her biopsy so I don't see the need for further immunosuppressive therapy especially with severe diarrhea (which cellcept can cause/ exacerbate).  Switch bicarb to NS, maybe needs it for 1 more day. Cr up a little bit today but no uremic symptoms.    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  Recent cardiac intervention precludes active listing for transplant at this time.  Continue pred 1 mg daily.  No indication for stress-dose steroids    3.  Diarrhea: per primary- C diff neg, GI pathogen panel with EPEC, started cipro (6/23).  CT abd/ pelvis negative  4.  Metabolic acidosis- PO bicarb, stop gtt  5.  Hypokalemia: will conservatively replete  6.  Recent NSTEMI: on ASA/ brilinta.  Would not think that brilinta would cause such profound diarrhea as SE.    7.  Dispo: pending improvement in diarrhea/ renal function   Subjective:    GI pathogen panel with EPEC.  Cipro started.  Cr up to 6.1 today.  Still with poor PO intake.   Objective:   BP (!) 128/101 (BP Location: Left Arm)   Pulse 96   Temp (!) 97.4 F (36.3 C) (Oral)   Resp 18   Ht 5\' 3"  (1.6 m)   Wt 79.2 kg (174 lb 9.7 oz)   SpO2 95%   BMI 30.93 kg/m   Intake/Output Summary (Last 24 hours) at 09/23/2017 1346 Last data filed at 09/23/2017 0600 Gross per 24 hour  Intake 420 ml  Output -  Net 420 ml   Weight change: 1.2 kg (2 lb 10.3 oz)  Physical Exam: GEN: appears fatigued, NAD HEENT EOMI PERRL MMM NECK no JVD PULM clear anteriorly CV RRR no m/r/g ABD soft, nontender, hyperactive BS EXT no LE edema NEURO AAO x 3 SKIN   Dry, better skin turgor    Imaging: Ct Abdomen Pelvis Wo Contrast  Result Date: 09/23/2017 CLINICAL DATA:  Diarrhea and nausea for 1 week. History of prior bowel obstruction. EXAM: CT ABDOMEN AND PELVIS WITHOUT CONTRAST TECHNIQUE: Multidetector CT imaging of the abdomen and pelvis was performed following the standard protocol without IV contrast. COMPARISON:  03/24/2014 FINDINGS: Lower chest: Lung bases are normal. Calcified plaque over the lateral circumflex coronary artery. Tiny amount of pericardial fluid. Hepatobiliary: Previous cholecystectomy. Liver and biliary tree are otherwise unremarkable. Pancreas: Normal. Spleen: Several small calcified granulomas, otherwise normal. Adrenals/Urinary Tract: Adrenal glands are normal and symmetric. Kidneys are normal in size without hydronephrosis or nephrolithiasis. Ureters are normal caliber without stones. Bladder is normal. Stomach/Bowel: Stomach and small bowel are normal. Appendix is normal. Colon is normal Vascular/Lymphatic: Vascular structures are within normal. Stable 1.8 cm low-density nodule in the right retrocrural space likely a varix. Reproductive: Normal. Other: No free fluid or focal inflammatory change. Musculoskeletal: Normal. IMPRESSION: No acute findings in the abdomen/pelvis. Electronically Signed   By: Marin Olp M.D.   On: 09/23/2017 09:06   Dg Chest 2 View  Result Date: 09/22/2017 CLINICAL DATA:  Cough. EXAM: CHEST - 2 VIEW COMPARISON:  Chest x-ray dated Aug 26, 2017. FINDINGS: Stable borderline cardiomegaly. Normal pulmonary vascularity. No focal consolidation, pleural effusion, or pneumothorax. No acute osseous abnormality. IMPRESSION: No active cardiopulmonary disease. Electronically Signed   By: Titus Dubin M.D.   On: 09/22/2017 11:55    Labs: BMET Recent Labs  Lab 09/21/17 1035 09/22/17 0515 09/23/17 0657  NA 137 137 139  K 4.4 3.4* 2.8*  CL 111 104 95*  CO2 12* 19* 30  GLUCOSE 89 105* 89  BUN 82* 71* 60*   CREATININE 5.72* 5.22* 6.07*  CALCIUM 8.3* 7.9* 7.1*   CBC Recent Labs  Lab 09/21/17 1035 09/22/17 0515 09/23/17 0657  WBC 15.2* 7.4 6.0  HGB 10.5* 11.3* 10.0*  HCT 33.5* 35.8* 32.0*  MCV 90.8 89.5 89.4  PLT 140* 141* 121*    Medications:    . allopurinol  100 mg Oral Daily  . amLODipine  10 mg Oral Daily  . aspirin  81 mg Oral Daily  . benzonatate  100 mg Oral TID  . carvedilol  6.25 mg Oral BID WC  . cholestyramine  4 g Oral TID AC  . ciprofloxacin  500 mg Oral Daily  . cloNIDine  0.1 mg Oral Daily  . famotidine  20 mg Oral Daily  . guaiFENesin  600 mg Oral BID  . hydrALAZINE  25 mg Oral 3 times per day  . potassium chloride  40 mEq Oral Once  . pravastatin  40 mg Oral q1800  . predniSONE  1 mg Oral Q breakfast  . sodium bicarbonate  650 mg Oral BID  . ticagrelor  90 mg Oral BID      Madelon Lips MD 09/23/2017, 1:46 PM

## 2017-09-23 NOTE — Progress Notes (Signed)
[facility]                                                                              Patient Demographics  Lindsey York, is a 52 y.o. female, DOB - 11-27-1965, FTD:[id number]  Admit date - 09/21/2017   Admitting Physician Roney Jaffe, MD  Outpatient Primary MD for the patient is System, Pcp Not In  Outpatient specialists:   LOS - 2  days   Medical records reviewed and are as summarized below:    Chief Complaint  Patient presents with  . Abdominal Pain       Brief summary   52 y.o.femalewith medical history significantfor lupus, htn, hyperlipidemia, non-stemi s/p cath and stents 3 weeks ago since emergency Department chief complaint persistent diarrhea for last 6 days prior to admission.  Complaint of intermittent abdominal bloating, excessive flatus and cramping bowel movements.  On the day of admission.nausea with one episode of emesis.  Patient was admitted for further work-up.  Creatinine 5.7 at the time of admission   Assessment & Plan    Principal Problem:   Acute kidney injury superimposed on CKD (HCC) stage IV -No uremic symptoms, likely due to persistent diarrhea, dehydration and poor oral intake -Creatinine worse, 6.0 today, continue isotonic bicarb, nephrology following -No CellCept at this time.  Active Problems:   NSTEMI (non-ST elevated myocardial infarction) ([place]) -Recent NSTEMI with cardiac cath last month, continue aspirin, Brilinta, Coreg, statin  Diarrhea -C. difficile negative however GI pathogen panel shows enteropathogenic E. 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 allopurinol  100 mg Oral Daily  . amLODipine  10 mg Oral Daily  . aspirin  81 mg Oral Daily  . benzonatate  100 mg Oral TID  . carvedilol  6.25 mg Oral BID WC  . cholestyramine  4 g Oral TID AC  . ciprofloxacin  500 mg Oral Daily  . cloNIDine  0.1 mg Oral Daily  . famotidine  20 mg Oral Daily  . guaiFENesin  600 mg Oral BID  . hydrALAZINE  25 mg Oral 3 times per day  . potassium chloride  40 mEq Oral Once  . pravastatin  40 mg Oral q1800  . predniSONE  1 mg Oral Q breakfast  . sodium bicarbonate  650 mg Oral BID  . ticagrelor  90 mg Oral BID   Continuous Infusions: . sodium chloride     PRN Meds:.acetaminophen, diphenoxylate-atropine, guaiFENesin, ondansetron **OR**  ondansetron (ZOFRAN) IV, oxybutynin   Antibiotics   Anti-infectives (From admission, onward)   Start     Dose/Rate Route Frequency Ordered Stop   09/23/17 1000  ciprofloxacin (CIPRO) tablet 500 mg     500 mg Oral Daily 09/23/17 0730          Subjective:   Lindsey York was seen and examined today.  States diarrhea is slightly improving today, no fevers overnight.  No vomiting.  Hoping to advance diet.  Patient denies dizziness, chest pain, shortness of breath, new weakness, numbess, tingling. No acute events overnight.    Objective:   Vitals:   09/22/17 1603 09/22/17 2138 09/23/17 0356 09/23/17 0932  BP: 122/87 126/85 (!) 133/96 (!) 128/101  Pulse: 82 95 (!) 101 96  Resp: 18 16  18   Temp: 98.1 F (36.7 C) 98.3 F (36.8 C) 98.2 F (36.8 C) (!) 97.4 F (36.3 C)  TempSrc: Oral Oral Oral Oral  SpO2: 96% 93% 94% 95%  Weight:  79.2 kg (174 lb  9.7 oz)    Height:        Intake/Output Summary (Last 24 hours) at 09/23/2017 1115 Last data filed at 09/23/2017 0600 Gross per 24 hour  Intake 420 ml  Output -  Net 420 ml     Wt Readings from Last 3 Encounters:  09/22/17 79.2 kg (174 lb 9.7 oz)  09/10/17 76.7 kg (169 lb)  08/31/17 80 kg (176 lb 5.9 oz)     Exam  General: Alert and oriented x 3, NAD  Eyes:   HEENT:  Atraumatic, normocephalic  Cardiovascular: S1 S2 auscultated,  Regular rate and rhythm.  Respiratory: Clear to auscultation bilaterally, no wheezing, rales or rhonchi  Gastrointestinal: Soft, nontender, nondistended, + bowel sounds  Ext: no pedal edema bilaterally  Neuro: no new deficit  Musculoskeletal: No digital cyanosis, clubbing  Skin: No rashes  Psych: Normal affect and demeanor, alert and oriented x3    Data Reviewed:  I have personally reviewed following labs and imaging studies  Micro Results Recent Results (from the past 240 hour(s))  C difficile quick scan w PCR reflex     Status: None   Collection Time: 09/21/17  2:33 PM  Result Value Ref Range Status   C Diff antigen NEGATIVE NEGATIVE Final   C Diff toxin NEGATIVE NEGATIVE Final   C Diff interpretation No C. difficile detected.  Final    Comment: Performed at [facility], [place] [address], [place], [place] [address]  Gastrointestinal Panel by PCR , Stool     Status: Abnormal   Collection Time: 09/21/17  2:33 PM  Result Value Ref Range Status   Campylobacter species NOT DETECTED NOT DETECTED Final   Plesimonas shigelloides NOT DETECTED NOT DETECTED Final   Salmonella species NOT DETECTED NOT DETECTED Final   Yersinia enterocolitica NOT DETECTED NOT DETECTED Final   Vibrio species NOT DETECTED NOT DETECTED Final   Vibrio cholerae NOT DETECTED NOT DETECTED Final   Enteroaggregative E coli (EAEC) NOT DETECTED NOT DETECTED Final   Enteropathogenic E coli (EPEC) DETECTED (A) NOT DETECTED Final    Comment: RESULT CALLED TO, READ  BACK BY AND VERIFIED WITH: ANITA PRIDDIE @1636  09/22/17 AKT    Enterotoxigenic E coli (ETEC) NOT DETECTED NOT DETECTED Final   Shiga like toxin producing E coli (STEC) NOT DETECTED NOT DETECTED Final   Shigella/Enteroinvasive E coli (EIEC) NOT DETECTED NOT DETECTED Final   Cryptosporidium NOT DETECTED NOT DETECTED Final   Cyclospora cayetanensis NOT  DETECTED NOT DETECTED Final   Entamoeba histolytica NOT DETECTED NOT DETECTED Final   Giardia lamblia NOT DETECTED NOT DETECTED Final   Adenovirus F40/41 NOT DETECTED NOT DETECTED Final   Astrovirus NOT DETECTED NOT DETECTED Final   Norovirus GI/GII NOT DETECTED NOT DETECTED Final   Rotavirus A NOT DETECTED NOT DETECTED Final   Sapovirus (I, II, IV, and V) NOT DETECTED NOT DETECTED Final    Comment: Performed at [facility], [address], [place], [place] [address]    Radiology Reports Ct Abdomen Pelvis Wo Contrast  Result Date: 09/23/2017 CLINICAL DATA:  Diarrhea and nausea for 1 week. History of prior bowel obstruction. EXAM: CT ABDOMEN AND PELVIS WITHOUT CONTRAST TECHNIQUE: Multidetector CT imaging of the abdomen and pelvis was performed following the standard protocol without IV contrast. COMPARISON:  03/24/2014 FINDINGS: Lower chest: Lung bases are normal. Calcified plaque over the lateral circumflex coronary artery. Tiny amount of pericardial fluid. Hepatobiliary: Previous cholecystectomy. Liver and biliary tree are otherwise unremarkable. Pancreas: Normal. Spleen: Several small calcified granulomas, otherwise normal. Adrenals/Urinary Tract: Adrenal glands are normal and symmetric. Kidneys are normal in size without hydronephrosis or nephrolithiasis. Ureters are normal caliber without stones. Bladder is normal. Stomach/Bowel: Stomach and small bowel are normal. Appendix is normal. Colon is normal Vascular/Lymphatic: Vascular structures are within normal. Stable 1.8 cm low-density nodule in the right retrocrural space likely a varix.  Reproductive: Normal. Other: No free fluid or focal inflammatory change. Musculoskeletal: Normal. IMPRESSION: No acute findings in the abdomen/pelvis. Electronically Signed   By: Marin Olp M.D.   On: 09/23/2017 09:06   Dg Chest 2 View  Result Date: 09/22/2017 CLINICAL DATA:  Cough. EXAM: CHEST - 2 VIEW COMPARISON:  Chest x-ray dated Aug 26, 2017. FINDINGS: Stable borderline cardiomegaly. Normal pulmonary vascularity. No focal consolidation, pleural effusion, or pneumothorax. No acute osseous abnormality. IMPRESSION: No active cardiopulmonary disease. Electronically Signed   By: Titus Dubin M.D.   On: 09/22/2017 11:55   Dg Chest 2 View  Result Date: 08/26/2017 CLINICAL DATA:  Chest pain EXAM: CHEST - 2 VIEW COMPARISON:  July 30, 2016 FINDINGS: There is no edema or consolidation. Heart is borderline enlarged with pulmonary vascularity normal. No adenopathy. No bone lesions. IMPRESSION: Heart borderline enlarged.  No edema or consolidation. Electronically Signed   By: Lowella Grip III M.D.   On: 08/26/2017 21:45    Lab Data:  CBC: Recent Labs  Lab 09/21/17 1035 09/22/17 0515 09/23/17 0657  WBC 15.2* 7.4 6.0  HGB 10.5* 11.3* 10.0*  HCT 33.5* 35.8* 32.0*  MCV 90.8 89.5 89.4  PLT 140* 141* 981*   Basic Metabolic Panel: Recent Labs  Lab 09/21/17 1035 09/22/17 0515 09/23/17 0657  NA 137 137 139  K 4.4 3.4* 2.8*  CL 111 104 95*  CO2 12* 19* 30  GLUCOSE 89 105* 89  BUN 82* 71* 60*  CREATININE 5.72* 5.22* 6.07*  CALCIUM 8.3* 7.9* 7.1*   GFR: Estimated Creatinine Clearance: 10.7 mL/min (A) (by C-G formula based on SCr of 6.07 mg/dL (H)). Liver Function Tests: Recent Labs  Lab 09/21/17 1035 09/22/17 0515 09/23/17 0657  AST 12* 11* 11*  ALT 11* 11* 10*  ALKPHOS 67 61 51  BILITOT 0.6 0.4 0.5  PROT 6.8 6.4* 5.9*  ALBUMIN 3.6 3.3* 3.0*   Recent Labs  Lab 09/21/17 1035  LIPASE 55*   No results for input(s): AMMONIA in the last 168 hours. Coagulation  Profile: No results for input(s): INR, PROTIME in the last  168 hours. Cardiac Enzymes: No results for input(s): CKTOTAL, CKMB, CKMBINDEX, TROPONINI in the last 168 hours. BNP (last 3 results) No results for input(s): PROBNP in the last 8760 hours. HbA1C: No results for input(s): HGBA1C in the last 72 hours. CBG: No results for input(s): GLUCAP in the last 168 hours. Lipid Profile: No results for input(s): CHOL, HDL, LDLCALC, TRIG, CHOLHDL, LDLDIRECT in the last 72 hours. Thyroid Function Tests: No results for input(s): TSH, T4TOTAL, FREET4, T3FREE, THYROIDAB in the last 72 hours. Anemia Panel: No results for input(s): VITAMINB12, FOLATE, FERRITIN, TIBC, IRON, RETICCTPCT in the last 72 hours. Urine analysis:    Component Value Date/Time   COLORURINE YELLOW 09/21/2017 2028   APPEARANCEUR HAZY (A) 09/21/2017 2028   LABSPEC 1.010 09/21/2017 2028   PHURINE 5.0 09/21/2017 2028   GLUCOSEU NEGATIVE 09/21/2017 2028   HGBUR SMALL (A) 09/21/2017 2028   BILIRUBINUR NEGATIVE 09/21/2017 2028   KETONESUR NEGATIVE 09/21/2017 2028   PROTEINUR >=300 (A) 09/21/2017 2028   UROBILINOGEN 0.2 03/24/2014 1420   NITRITE NEGATIVE 09/21/2017 2028   LEUKOCYTESUR NEGATIVE 09/21/2017 2028     Decarlo Rivet M.D. [facility] 09/23/2017, 11:15 AM  Pager: [phone] Between 7am to 7pm - call Pager - [phone]  After 7pm go to [email or web address] - password [email or web address]  Call night coverage person covering after 7pm

## 2017-09-24 ENCOUNTER — Telehealth: Payer: Self-pay | Admitting: Cardiovascular Disease

## 2017-09-24 LAB — CBC
HCT: 35.4 % — ABNORMAL LOW (ref 36.0–46.0)
Hemoglobin: 10.7 g/dL — ABNORMAL LOW (ref 12.0–15.0)
MCH: 27.8 pg (ref 26.0–34.0)
MCHC: 30.2 g/dL (ref 30.0–36.0)
MCV: 91.9 fL (ref 78.0–100.0)
PLATELETS: 147 10*3/uL — AB (ref 150–400)
RBC: 3.85 MIL/uL — ABNORMAL LOW (ref 3.87–5.11)
RDW: 13.8 % (ref 11.5–15.5)
WBC: 7.4 10*3/uL (ref 4.0–10.5)

## 2017-09-24 LAB — BASIC METABOLIC PANEL
Anion gap: 15 (ref 5–15)
BUN: 50 mg/dL — AB (ref 6–20)
CALCIUM: 7.3 mg/dL — AB (ref 8.9–10.3)
CHLORIDE: 98 mmol/L — AB (ref 101–111)
CO2: 29 mmol/L (ref 22–32)
CREATININE: 6.3 mg/dL — AB (ref 0.44–1.00)
GFR calc Af Amer: 8 mL/min — ABNORMAL LOW (ref >60)
GFR calc non Af Amer: 7 mL/min — ABNORMAL LOW (ref >60)
Glucose, Bld: 102 mg/dL — ABNORMAL HIGH (ref 65–99)
Potassium: 3 mmol/L — ABNORMAL LOW (ref 3.5–5.1)
Sodium: 142 mmol/L (ref 135–145)

## 2017-09-24 MED ORDER — POTASSIUM CHLORIDE 20 MEQ PO PACK
40.0000 meq | PACK | Freq: Once | ORAL | Status: AC
  Administered 2017-09-24: 40 meq via ORAL
  Filled 2017-09-24 (×2): qty 2

## 2017-09-24 MED ORDER — SODIUM CHLORIDE 0.9 % IV SOLN
INTRAVENOUS | Status: DC
  Administered 2017-09-24 – 2017-09-25 (×3): via INTRAVENOUS

## 2017-09-24 MED ORDER — GUAIFENESIN-DM 100-10 MG/5ML PO SYRP
5.0000 mL | ORAL_SOLUTION | ORAL | Status: DC | PRN
  Administered 2017-09-24 – 2017-09-25 (×2): 5 mL via ORAL
  Filled 2017-09-24 (×2): qty 5

## 2017-09-24 NOTE — Plan of Care (Signed)
  Problem: Health Behavior/Discharge Planning: °Goal: Ability to manage health-related needs will improve °Outcome: Progressing °  °Problem: Activity: °Goal: Risk for activity intolerance will decrease °Outcome: Progressing °  °Problem: Coping: °Goal: Level of anxiety will decrease °Outcome: Progressing °  °Problem: Elimination: °Goal: Will not experience complications related to bowel motility °Outcome: Progressing °Goal: Will not experience complications related to urinary retention °Outcome: Progressing °  °Problem: Pain Managment: °Goal: General experience of comfort will improve °Outcome: Progressing °  °Problem: Safety: °Goal: Ability to remain free from injury will improve °Outcome: Progressing °  °Problem: Skin Integrity: °Goal: Risk for impaired skin integrity will decrease °Outcome: Progressing °  °

## 2017-09-24 NOTE — Telephone Encounter (Signed)
Pt states she is returning call to [facility] from either Thursday or Friday of last week-she just got out of the hospital-pls advise [phone]

## 2017-09-24 NOTE — Progress Notes (Signed)
[facility]                                                                              Patient Demographics  Lindsey York, is a 52 y.o. female, DOB - [birth year], UKG:[id number]  Admit date - 09/21/2017   Admitting Physician Roney Jaffe, MD  Outpatient Primary MD for the patient is System, Pcp Not In  Outpatient specialists:   LOS - 3  days   Medical records reviewed and are as summarized below:    Chief Complaint  Patient presents with  . Abdominal Pain       Brief summary   52 y.o.femalewith medical history significantfor lupus, htn, hyperlipidemia, non-stemi s/p cath and stents 3 weeks ago since emergency Department chief complaint persistent diarrhea for last 6 days prior to admission.  Complaint of intermittent abdominal bloating, excessive flatus and cramping bowel movements.  On the day of admission.nausea with one episode of emesis.  Patient was admitted for further work-up.  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 -No CellCept at this time.  Active Problems:   NSTEMI (non-ST elevated myocardial infarction) ([place]) -Recent NSTEMI with cardiac cath last month, continue aspirin, Brilinta, Coreg, statin  Infection diarrhea -C. difficile negative however GI pathogen panel shows enteropathogenic E. Coli -CT abdomen did not show severe colitis, will continue oral ciprofloxacin for 3 days -States diarrhea is slowly improving you Lomotil, cholestyramine    Hypertension -Currently stable    Systemic lupus (HCC) with lupus nephritis -Per nephrology, recommended to stop CellCept, no indication for stress dose steroids, continue prednisone 1 mg  daily    Code Status: Full CODE STATUS DVT Prophylaxis:  SCD's Family Communication: Discussed in detail with the patient, all imaging results, lab results explained to the patient   Disposition Plan:   Time Spent in minutes   25 minutes  Procedures:  CT abdomen  Consultants:   Nephrology  Antimicrobials:   Ciprofloxacin   Medications  Scheduled Meds: . allopurinol  100 mg Oral Daily  . amLODipine  10 mg Oral Daily  . aspirin  81 mg Oral Daily  . benzonatate  100 mg Oral TID  . carvedilol  6.25 mg Oral BID WC  . cholestyramine  4 g Oral TID AC  . ciprofloxacin  500 mg Oral Daily  . cloNIDine  0.1 mg Oral Daily  . famotidine  20 mg Oral Daily  . guaiFENesin  600 mg Oral BID  . hydrALAZINE  25 mg Oral 3 times per day  . pravastatin  40 mg Oral q1800  . predniSONE  1 mg Oral Q breakfast  . sodium bicarbonate  650 mg Oral BID  . ticagrelor  90 mg Oral BID   Continuous Infusions: . sodium chloride 100 mL/hr at 09/24/17 0640   PRN Meds:.acetaminophen, diphenoxylate-atropine, oxybutynin, promethazine   Antibiotics   Anti-infectives (From admission, onward)  Start     Dose/Rate Route Frequency Ordered Stop   09/23/17 1000  ciprofloxacin (CIPRO) tablet 500 mg     500 mg Oral Daily 09/23/17 0730          Subjective:   Lindsey York was seen and examined today.  States diarrhea is improving, no fevers or chills, nausea vomiting. Patient denies dizziness, chest pain, shortness of breath, new weakness, numbess, tingling. No acute events overnight.    Objective:   Vitals:   09/23/17 1614 09/23/17 2125 09/24/17 0320 09/24/17 1205  BP: (!) 147/98 107/76 (!) 127/101 113/83  Pulse: 93 100 100 91  Resp: 18 18 16 18   Temp: 97.8 F (36.6 C) 99 F (37.2 C) 98.3 F (36.8 C) 98.7 F (37.1 C)  TempSrc:  Oral Oral Oral  SpO2: 94% 97% 94% 94%  Weight:  70.8 kg (156 lb 1.4 oz) 70.8 kg (156 lb 1.4 oz)   Height:        Intake/Output Summary (Last 24 hours) at  09/24/2017 1447 Last data filed at 09/24/2017 0900 Gross per 24 hour  Intake 1523.98 ml  Output 700 ml  Net 823.98 ml     Wt Readings from Last 3 Encounters:  09/24/17 70.8 kg (156 lb 1.4 oz)  09/10/17 76.7 kg (169 lb)  08/31/17 80 kg (176 lb 5.9 oz)     Exam   General: Alert and oriented x 3, NAD  Eyes:   HEENT:    Cardiovascular: S1 S2 auscultated,  Regular rate and rhythm. No pedal edema b/l  Respiratory: Clear to auscultation bilaterally, no wheezing, rales or rhonchi  Gastrointestinal: Soft, nontender, nondistended, + bowel sounds  Ext: no pedal edema bilaterally  Neuro: no new deficits  Musculoskeletal: No digital cyanosis, clubbing  Skin: No rashes  Psych: Normal affect and demeanor, alert and oriented x3    Data Reviewed:  I have personally reviewed following labs and imaging studies  Micro Results Recent Results (from the past 240 hour(s))  C difficile quick scan w PCR reflex     Status: None   Collection Time: 09/21/17  2:33 PM  Result Value Ref Range Status   C Diff antigen NEGATIVE NEGATIVE Final   C Diff toxin NEGATIVE NEGATIVE Final   C Diff interpretation No C. difficile detected.  Final    Comment: Performed at [facility], [place] [address] [place], [place] [address]  Gastrointestinal Panel by PCR , Stool     Status: Abnormal   Collection Time: 09/21/17  2:33 PM  Result Value Ref Range Status   Campylobacter species NOT DETECTED NOT DETECTED Final   Plesimonas shigelloides NOT DETECTED NOT DETECTED Final   Salmonella species NOT DETECTED NOT DETECTED Final   Yersinia enterocolitica NOT DETECTED NOT DETECTED Final   Vibrio species NOT DETECTED NOT DETECTED Final   Vibrio cholerae NOT DETECTED NOT DETECTED Final   Enteroaggregative E coli (EAEC) NOT DETECTED NOT DETECTED Final   Enteropathogenic E coli (EPEC) DETECTED (A) NOT DETECTED Final    Comment: RESULT CALLED TO, READ BACK BY AND VERIFIED WITH: ANITA PRIDDIE @1636  09/22/17 AKT     Enterotoxigenic E coli (ETEC) NOT DETECTED NOT DETECTED Final   Shiga like toxin producing E coli (STEC) NOT DETECTED NOT DETECTED Final   Shigella/Enteroinvasive E coli (EIEC) NOT DETECTED NOT DETECTED Final   Cryptosporidium NOT DETECTED NOT DETECTED Final   Cyclospora cayetanensis NOT DETECTED NOT DETECTED Final   Entamoeba histolytica NOT DETECTED NOT DETECTED Final  Giardia lamblia NOT DETECTED NOT DETECTED Final   Adenovirus F40/41 NOT DETECTED NOT DETECTED Final   Astrovirus NOT DETECTED NOT DETECTED Final   Norovirus GI/GII NOT DETECTED NOT DETECTED Final   Rotavirus A NOT DETECTED NOT DETECTED Final   Sapovirus (I, II, IV, and V) NOT DETECTED NOT DETECTED Final    Comment: Performed at [facility], [address], [place], [place] [address]    Radiology Reports Ct Abdomen Pelvis Wo Contrast  Result Date: 09/23/2017 CLINICAL DATA:  Diarrhea and nausea for 1 week. History of prior bowel obstruction. EXAM: CT ABDOMEN AND PELVIS WITHOUT CONTRAST TECHNIQUE: Multidetector CT imaging of the abdomen and pelvis was performed following the standard protocol without IV contrast. COMPARISON:  03/24/2014 FINDINGS: Lower chest: Lung bases are normal. Calcified plaque over the lateral circumflex coronary artery. Tiny amount of pericardial fluid. Hepatobiliary: Previous cholecystectomy. Liver and biliary tree are otherwise unremarkable. Pancreas: Normal. Spleen: Several small calcified granulomas, otherwise normal. Adrenals/Urinary Tract: Adrenal glands are normal and symmetric. Kidneys are normal in size without hydronephrosis or nephrolithiasis. Ureters are normal caliber without stones. Bladder is normal. Stomach/Bowel: Stomach and small bowel are normal. Appendix is normal. Colon is normal Vascular/Lymphatic: Vascular structures are within normal. Stable 1.8 cm low-density nodule in the right retrocrural space likely a varix. Reproductive: Normal. Other: No free fluid or focal  inflammatory change. Musculoskeletal: Normal. IMPRESSION: No acute findings in the abdomen/pelvis. Electronically Signed   By: Marin Olp M.D.   On: 09/23/2017 09:06   Dg Chest 2 View  Result Date: 09/22/2017 CLINICAL DATA:  Cough. EXAM: CHEST - 2 VIEW COMPARISON:  Chest x-ray dated Aug 26, 2017. FINDINGS: Stable borderline cardiomegaly. Normal pulmonary vascularity. No focal consolidation, pleural effusion, or pneumothorax. No acute osseous abnormality. IMPRESSION: No active cardiopulmonary disease. Electronically Signed   By: Titus Dubin M.D.   On: 09/22/2017 11:55   Dg Chest 2 View  Result Date: 08/26/2017 CLINICAL DATA:  Chest pain EXAM: CHEST - 2 VIEW COMPARISON:  July 30, 2016 FINDINGS: There is no edema or consolidation. Heart is borderline enlarged with pulmonary vascularity normal. No adenopathy. No bone lesions. IMPRESSION: Heart borderline enlarged.  No edema or consolidation. Electronically Signed   By: Lowella Grip III M.D.   On: 08/26/2017 21:45    Lab Data:  CBC: Recent Labs  Lab 09/21/17 1035 09/22/17 0515 09/23/17 0657 09/24/17 0806  WBC 15.2* 7.4 6.0 7.4  HGB 10.5* 11.3* 10.0* 10.7*  HCT 33.5* 35.8* 32.0* 35.4*  MCV 90.8 89.5 89.4 91.9  PLT 140* 141* 121* 633*   Basic Metabolic Panel: Recent Labs  Lab 09/21/17 1035 09/22/17 0515 09/23/17 0657 09/24/17 0806  NA 137 137 139 142  K 4.4 3.4* 2.8* 3.0*  CL 111 104 95* 98*  CO2 12* 19* 30 29  GLUCOSE 89 105* 89 102*  BUN 82* 71* 60* 50*  CREATININE 5.72* 5.22* 6.07* 6.30*  CALCIUM 8.3* 7.9* 7.1* 7.3*   GFR: Estimated Creatinine Clearance: 9.7 mL/min (A) (by C-G formula based on SCr of 6.3 mg/dL (H)). Liver Function Tests: Recent Labs  Lab 09/21/17 1035 09/22/17 0515 09/23/17 0657  AST 12* 11* 11*  ALT 11* 11* 10*  ALKPHOS 67 61 51  BILITOT 0.6 0.4 0.5  PROT 6.8 6.4* 5.9*  ALBUMIN 3.6 3.3* 3.0*   Recent Labs  Lab 09/21/17 1035  LIPASE 55*   No results for input(s): AMMONIA in the  last 168 hours. Coagulation Profile: No results for input(s): INR, PROTIME in  the last 168 hours. Cardiac Enzymes: No results for input(s): CKTOTAL, CKMB, CKMBINDEX, TROPONINI in the last 168 hours. BNP (last 3 results) No results for input(s): PROBNP in the last 8760 hours. HbA1C: No results for input(s): HGBA1C in the last 72 hours. CBG: No results for input(s): GLUCAP in the last 168 hours. Lipid Profile: No results for input(s): CHOL, HDL, LDLCALC, TRIG, CHOLHDL, LDLDIRECT in the last 72 hours. Thyroid Function Tests: No results for input(s): TSH, T4TOTAL, FREET4, T3FREE, THYROIDAB in the last 72 hours. Anemia Panel: No results for input(s): VITAMINB12, FOLATE, FERRITIN, TIBC, IRON, RETICCTPCT in the last 72 hours. Urine analysis:    Component Value Date/Time   COLORURINE YELLOW 09/21/2017 2028   APPEARANCEUR HAZY (A) 09/21/2017 2028   LABSPEC 1.010 09/21/2017 2028   PHURINE 5.0 09/21/2017 2028   GLUCOSEU NEGATIVE 09/21/2017 2028   HGBUR SMALL (A) 09/21/2017 2028   BILIRUBINUR NEGATIVE 09/21/2017 2028   KETONESUR NEGATIVE 09/21/2017 2028   PROTEINUR >=300 (A) 09/21/2017 2028   UROBILINOGEN 0.2 03/24/2014 1420   NITRITE NEGATIVE 09/21/2017 2028   LEUKOCYTESUR NEGATIVE 09/21/2017 2028     Ripudeep Rai M.D. [facility] 09/24/2017, 2:47 PM  Pager: [phone] Between 7am to 7pm - call Pager - [phone]  After 7pm go to [email or web address] - password [email or web address]  Call night coverage person covering after 7pm

## 2017-09-24 NOTE — Telephone Encounter (Signed)
attempted to return call to pt. No answer and unable to leave message due to phone just ringing.

## 2017-09-24 NOTE — Progress Notes (Signed)
[facility]GRESS NOTE  Assessment/ Plan: Pt is a 52 y.o. yo female with history of lupus, hypertension, hyperlipidemia, coronary artery disease status post stent, CKD stage V, admitted with persistent diarrhea and abdominal pain found to have E. coli infection and worsening renal failure.  Assessment/Plan:  #Acute kidney injury on CKD stage IV due to diarrhea and poor oral intake: Patient has no uremic symptoms.  Urine output is not accurately recorded however patient reported that she has good urine output.  Serum creatinine level is trending up to 6.3.  No urgent indication for dialysis today.  CellCept is currently on hold.  Apparently she had a global scaring on her biopsy therefore immunosuppressive therapy may not be helpful especially since patient has diarrhea.  Continue IV fluid.  Discontinue sodium bicarbonate tablet since CO2 is elevated.  After discussion with the patient, patient reported that she has been discussing with her nephrologist at [facility] regarding peritoneal dialysis.  She wants to follow-up with her nephrologist after discharge.  Hopefully her creatinine level will plateau.  #Lupus nephritis: Stopping CellCept.  On oral prednisone.  #Infectious diarrhea: On Cipro since 6/23.  #Hypokalemia: Replete potassium chloride.  # HTN/volume: Blood pressure acceptable.  Volume status acceptable.  Continue current medication.   Subjective: Seen and examined at bedside.  Denied nausea vomiting chest pain shortness of breath.  No abdominal pain.  Diarrhea is gradually better. Objective Vital signs in last 24 hours: Vitals:   09/23/17 1614 09/23/17 2125 09/24/17 0320 09/24/17 1205  BP: (!) 147/98 107/76 (!) 127/101 113/83  Pulse: 93 100 100 91  Resp: 18 18 16 18   Temp: 97.8 F (36.6 C) 99 F (37.2 C) 98.3 F (36.8 C) 98.7 F (37.1 C)  TempSrc:  Oral Oral Oral  SpO2: 94% 97% 94% 94%  Weight:  70.8 kg (156 lb 1.4 oz) 70.8 kg (156 lb 1.4  oz)   Height:       Weight change: -8.4 kg (-18 lb 8.3 oz)  Intake/Output Summary (Last 24 hours) at 09/24/2017 1540 Last data filed at 09/24/2017 0900 Gross per 24 hour  Intake 1523.98 ml  Output 700 ml  Net 823.98 ml       Labs: Basic Metabolic Panel: Recent Labs  Lab 09/22/17 0515 09/23/17 0657 09/24/17 0806  NA 137 139 142  K 3.4* 2.8* 3.0*  CL 104 95* 98*  CO2 19* 30 29  GLUCOSE 105* 89 102*  BUN 71* 60* 50*  CREATININE 5.22* 6.07* 6.30*  CALCIUM 7.9* 7.1* 7.3*   Liver Function Tests: Recent Labs  Lab 09/21/17 1035 09/22/17 0515 09/23/17 0657  AST 12* 11* 11*  ALT 11* 11* 10*  ALKPHOS 67 61 51  BILITOT 0.6 0.4 0.5  PROT 6.8 6.4* 5.9*  ALBUMIN 3.6 3.3* 3.0*   Recent Labs  Lab 09/21/17 1035  LIPASE 55*   No results for input(s): AMMONIA in the last 168 hours. CBC: Recent Labs  Lab 09/21/17 1035 09/22/17 0515 09/23/17 0657 09/24/17 0806  WBC 15.2* 7.4 6.0 7.4  HGB 10.5* 11.3* 10.0* 10.7*  HCT 33.5* 35.8* 32.0* 35.4*  MCV 90.8 89.5 89.4 91.9  PLT 140* 141* 121* 147*   Cardiac Enzymes: No results for input(s): CKTOTAL, CKMB, CKMBINDEX, TROPONINI in the last 168 hours. CBG: No results for input(s): GLUCAP in the last 168 hours.  Iron Studies: No results for input(s): IRON, TIBC, TRANSFERRIN, FERRITIN in the last 72 hours. Studies/Results: Ct Abdomen Pelvis Wo Contrast  Result Date: 09/23/2017  CLINICAL DATA:  Diarrhea and nausea for 1 week. History of prior bowel obstruction. EXAM: CT ABDOMEN AND PELVIS WITHOUT CONTRAST TECHNIQUE: Multidetector CT imaging of the abdomen and pelvis was performed following the standard protocol without IV contrast. COMPARISON:  03/24/2014 FINDINGS: Lower chest: Lung bases are normal. Calcified plaque over the lateral circumflex coronary artery. Tiny amount of pericardial fluid. Hepatobiliary: Previous cholecystectomy. Liver and biliary tree are otherwise unremarkable. Pancreas: Normal. Spleen: Several small calcified  granulomas, otherwise normal. Adrenals/Urinary Tract: Adrenal glands are normal and symmetric. Kidneys are normal in size without hydronephrosis or nephrolithiasis. Ureters are normal caliber without stones. Bladder is normal. Stomach/Bowel: Stomach and small bowel are normal. Appendix is normal. Colon is normal Vascular/Lymphatic: Vascular structures are within normal. Stable 1.8 cm low-density nodule in the right retrocrural space likely a varix. Reproductive: Normal. Other: No free fluid or focal inflammatory change. Musculoskeletal: Normal. IMPRESSION: No acute findings in the abdomen/pelvis. Electronically Signed   By: Marin Olp M.D.   On: 09/23/2017 09:06    Medications: Infusions: . sodium chloride 100 mL/hr at 09/24/17 0640    Scheduled Medications: . allopurinol  100 mg Oral Daily  . amLODipine  10 mg Oral Daily  . aspirin  81 mg Oral Daily  . benzonatate  100 mg Oral TID  . carvedilol  6.25 mg Oral BID WC  . cholestyramine  4 g Oral TID AC  . ciprofloxacin  500 mg Oral Daily  . cloNIDine  0.1 mg Oral Daily  . famotidine  20 mg Oral Daily  . guaiFENesin  600 mg Oral BID  . hydrALAZINE  25 mg Oral 3 times per day  . potassium chloride  40 mEq Oral Once  . pravastatin  40 mg Oral q1800  . predniSONE  1 mg Oral Q breakfast  . ticagrelor  90 mg Oral BID    have reviewed scheduled and prn medications.  Physical Exam: General:NAD, comfortable Heart:RRR, s1s2 nl Lungs:clear b/l, no cracjle Abdomen:soft, Non-tender, non-distended Extremities:No edema  Crystin Lechtenberg Tanna Furry 09/24/2017,3:40 PM  LOS: 3 days

## 2017-09-25 LAB — BASIC METABOLIC PANEL
Anion gap: 12 (ref 5–15)
BUN: 39 mg/dL — AB (ref 6–20)
CO2: 26 mmol/L (ref 22–32)
CREATININE: 5.4 mg/dL — AB (ref 0.44–1.00)
Calcium: 6.6 mg/dL — ABNORMAL LOW (ref 8.9–10.3)
Chloride: 105 mmol/L (ref 98–111)
GFR calc Af Amer: 10 mL/min — ABNORMAL LOW (ref >60)
GFR, EST NON AFRICAN AMERICAN: 8 mL/min — AB (ref >60)
GLUCOSE: 122 mg/dL — AB (ref 70–99)
POTASSIUM: 3.2 mmol/L — AB (ref 3.5–5.1)
SODIUM: 143 mmol/L (ref 135–145)

## 2017-09-25 MED ORDER — HYDROCORTISONE NA SUCCINATE PF 100 MG IJ SOLR
50.0000 mg | Freq: Three times a day (TID) | INTRAMUSCULAR | Status: AC
  Administered 2017-09-25 (×2): 50 mg via INTRAVENOUS
  Filled 2017-09-25 (×2): qty 2

## 2017-09-25 MED ORDER — POTASSIUM CHLORIDE 20 MEQ PO PACK
40.0000 meq | PACK | Freq: Once | ORAL | Status: AC
  Administered 2017-09-25: 40 meq via ORAL
  Filled 2017-09-25: qty 2

## 2017-09-25 MED ORDER — SIMETHICONE 80 MG PO CHEW
80.0000 mg | CHEWABLE_TABLET | Freq: Three times a day (TID) | ORAL | Status: DC
Start: 2017-09-25 — End: 2017-09-26
  Administered 2017-09-25 – 2017-09-26 (×4): 80 mg via ORAL
  Filled 2017-09-25 (×4): qty 1

## 2017-09-25 MED ORDER — PREDNISONE 5 MG PO TABS
6.0000 mg | ORAL_TABLET | Freq: Every day | ORAL | Status: DC
  Administered 2017-09-26: 6 mg via ORAL
  Filled 2017-09-25: qty 1

## 2017-09-25 NOTE — Progress Notes (Signed)
[facility] PROGRESS NOTE  Assessment/ Plan: Pt is a 52 y.o. yo female with history of lupus, hypertension, hyperlipidemia, coronary artery disease status post stent, CKD stage V, admitted with persistent diarrhea and abdominal pain found to have E. coli infection and worsening renal failure.  Assessment/Plan:  #Acute kidney injury on CKD stage V due to diarrhea and poor oral intake: Patient has no uremic symptoms.  Diarrhea is better now.  She has good urine output and serum creatinine level trending down to 5.4.  No urgent indication for dialysis. Discontinue IVF.  Apparently she had a global scaring on her biopsy therefore immunosuppressive therapy may not be helpful especially since patient has diarrhea.  Recommend to stop CellCept and continue on oral prednisone with outpatient follow-up. Patient is interested in peritoneal dialysis and follow-up with her nephrologist at [facility] ( Dr. Noemi Chapel).   #Lupus nephritis: Stopping CellCept.  On oral prednisone.  #Infectious diarrhea: On Cipro since 6/23.  #Hypokalemia: Replete potassium chloride.  # HTN/volume: Blood pressure acceptable.  Volume status acceptable.  Continue current medication.  Patient is stable to discharge with follow up with her nephrologist within a week. I will sign off, please call us with any question.   Subjective: Seen and examined at bedside.  Diarrhea is better.  Denied nausea vomiting chest pain, shortness of breath. Objective Vital signs in last 24 hours: Vitals:   09/25/17 0040 09/25/17 0504 09/25/17 0841 09/25/17 0856  BP: (!) 129/93 (!) 130/93 (!) 138/97 (!) 139/98  Pulse: 90 93 98 99  Resp: 16 16 16 17   Temp:  98.9 F (37.2 C) 98.1 F (36.7 C) 98.3 F (36.8 C)  TempSrc:  Oral Oral Oral  SpO2: (!) 89% 91% 95% 96%  Weight:      Height:       Weight change: 0 kg (0 lb)  Intake/Output Summary (Last 24 hours) at 09/25/2017 1430 Last data filed at 09/25/2017 1345 Gross per  24 hour  Intake 3147.17 ml  Output 1870 ml  Net 1277.17 ml       Labs: Basic Metabolic Panel: Recent Labs  Lab 09/23/17 0657 09/24/17 0806 09/25/17 0348  NA 139 142 143  K 2.8* 3.0* 3.2*  CL 95* 98* 105  CO2 30 29 26   GLUCOSE 89 102* 122*  BUN 60* 50* 39*  CREATININE 6.07* 6.30* 5.40*  CALCIUM 7.1* 7.3* 6.6*   Liver Function Tests: Recent Labs  Lab 09/21/17 1035 09/22/17 0515 09/23/17 0657  AST 12* 11* 11*  ALT 11* 11* 10*  ALKPHOS 67 61 51  BILITOT 0.6 0.4 0.5  PROT 6.8 6.4* 5.9*  ALBUMIN 3.6 3.3* 3.0*   Recent Labs  Lab 09/21/17 1035  LIPASE 55*   No results for input(s): AMMONIA in the last 168 hours. CBC: Recent Labs  Lab 09/21/17 1035 09/22/17 0515 09/23/17 0657 09/24/17 0806  WBC 15.2* 7.4 6.0 7.4  HGB 10.5* 11.3* 10.0* 10.7*  HCT 33.5* 35.8* 32.0* 35.4*  MCV 90.8 89.5 89.4 91.9  PLT 140* 141* 121* 147*   Cardiac Enzymes: No results for input(s): CKTOTAL, CKMB, CKMBINDEX, TROPONINI in the last 168 hours. CBG: No results for input(s): GLUCAP in the last 168 hours.  Iron Studies: No results for input(s): IRON, TIBC, TRANSFERRIN, FERRITIN in the last 72 hours. Studies/Results: No results found.  Medications: Infusions: . sodium chloride 100 mL/hr at 09/25/17 0208    Scheduled Medications: . allopurinol  100 mg Oral Daily  . amLODipine  10 mg Oral  Daily  . aspirin  81 mg Oral Daily  . benzonatate  100 mg Oral TID  . carvedilol  6.25 mg Oral BID WC  . cholestyramine  4 g Oral TID AC  . ciprofloxacin  500 mg Oral Daily  . cloNIDine  0.1 mg Oral Daily  . famotidine  20 mg Oral Daily  . guaiFENesin  600 mg Oral BID  . hydrALAZINE  25 mg Oral 3 times per day  . hydrocortisone sod succinate (SOLU-CORTEF) inj  50 mg Intravenous Q8H  . pravastatin  40 mg Oral q1800  . [START ON 09/26/2017] predniSONE  6 mg Oral Q breakfast  . simethicone  80 mg Oral TID  . ticagrelor  90 mg Oral BID    have reviewed scheduled and prn  medications.  Physical Exam: General: Not in distress, comfortable Heart: Regular rate rhythm, S1-S2 normal Lungs: Clear bilateral, no crackle jle Abdomen:soft, Non-tender, non-distended Extremities: No lower extremity edema. Dron Prasad Bhandari 09/25/2017,2:30 PM  LOS: 4 days

## 2017-09-25 NOTE — Care Management Note (Signed)
Case Management Note  Patient Details  Name: Lindsey York MRN: [id number] Date of Birth: [birth year]  Subjective/Objective:                 Spoke w patient. She states she goes to [facility], her PCP is Dr Lowella Petties. She denies barriers obtaining medication or appointments. No CM needs identified.    Action/Plan:   Expected Discharge Date:                  Expected Discharge Plan:  Home/Self Care  In-House Referral:     Discharge planning Services  CM Consult  Post Acute Care Choice:    Choice offered to:     DME Arranged:    DME Agency:     HH Arranged:    HH Agency:     Status of Service:  Completed, signed off  If discussed at [employer] of Stay Meetings, dates discussed:    Additional Comments:  Carles Collet, RN 09/25/2017, 2:02 PM

## 2017-09-25 NOTE — Progress Notes (Signed)
[facility]                                                                              Patient Demographics  Lindsey York, is a 52 y.o. female, DOB - 1966-03-10, OEV:[id number]  Admit date - 09/21/2017   Admitting Physician Roney Jaffe, MD  Outpatient Primary MD for the patient is System, Pcp Not In  Outpatient specialists:   LOS - 4  days   Medical records reviewed and are as summarized below:    Chief Complaint  Patient presents with  . Abdominal Pain       Brief summary   52 y.o.femalewith medical history significantfor lupus, htn, hyperlipidemia, non-stemi s/p cath and stents 3 weeks ago since emergency Department chief complaint persistent diarrhea for last 6 days prior to admission.  Complaint of intermittent abdominal bloating, excessive flatus and cramping bowel movements.  On the day of admission.nausea with one episode of emesis.  Patient was admitted for further work-up.  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 -Per patient she is on 6 mg of prednisone every day, however was receiving 1 mg daily during hospitalization.  Feeling weak and nauseous today, placed on Solu-Cortef 50 mg every 8 hours x2 doses for stress dose steroids and resume 6 mg of prednisone in a.m. 6/26  Active Problems:   NSTEMI (non-ST elevated myocardial infarction) ([place]) -Recent NSTEMI with cardiac cath last month, continue aspirin, Brilinta, Coreg, statin  Infection diarrhea -C. 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  Will place on 2 doses of Solu-Cortef for stress dose steroids and resume 6 mg prednisone in a.m.  Hypokalemia Replaced    Code Status: Full CODE STATUS DVT Prophylaxis:  SCD's Family Communication: Discussed in detail with the patient, all imaging results, lab results explained to the patient   Disposition Plan: Hopefully DC home in a.m. if feels better and cleared by nephrology  Time Spent in minutes   25 minutes  Procedures:  CT abdomen  Consultants:   Nephrology  Antimicrobials:   Ciprofloxacin   Medications  Scheduled Meds: . allopurinol  100 mg Oral Daily  . amLODipine  10 mg Oral Daily  . aspirin  81 mg Oral Daily  . benzonatate  100 mg Oral TID  . carvedilol  6.25 mg Oral BID WC  . cholestyramine  4 g Oral TID AC  . ciprofloxacin  500 mg Oral Daily  . cloNIDine  0.1 mg Oral Daily  . famotidine  20 mg Oral Daily  . guaiFENesin  600 mg Oral BID  . hydrALAZINE  25 mg Oral 3 times per day  . hydrocortisone sod succinate (SOLU-CORTEF) inj  50 mg Intravenous Q8H  . pravastatin  40 mg Oral q1800  . [START ON 09/26/2017] predniSONE  6 mg Oral Q breakfast  . simethicone  80 mg Oral TID  . ticagrelor  90 mg Oral BID   Continuous Infusions: . sodium chloride 100 mL/hr at 09/25/17 0208   PRN Meds:.acetaminophen, diphenoxylate-atropine, guaiFENesin-dextromethorphan, oxybutynin, promethazine   Antibiotics   Anti-infectives (From admission, onward)   Start     Dose/Rate Route Frequency Ordered Stop   09/23/17 1000  ciprofloxacin (CIPRO) tablet 500 mg     500 mg Oral Daily 09/23/17 0730          Subjective:   Dalya Maselli was seen and examined today.  Diarrhea is improving, no fevers or chills, feeling  miserable, weak and nauseous.  No fevers.  Patient denies dizziness, chest pain, shortness of breath, new weakness, numbess, tingling. No acute events overnight.    Objective:   Vitals:   09/25/17 0040 09/25/17 0504 09/25/17 0841 09/25/17 0856  BP: (!) 129/93 (!) 130/93 (!) 138/97 (!) 139/98  Pulse: 90 93 98 99  Resp: 16 16 16 17   Temp:  98.9 F (37.2 C) 98.1 F (36.7 C) 98.3 F (36.8 C)  TempSrc:  Oral Oral Oral  SpO2: (!) 89% 91% 95% 96%  Weight:      Height:        Intake/Output Summary (Last 24 hours) at 09/25/2017 1108 Last data filed at 09/25/2017 0600 Gross per 24 hour  Intake 2727.17 ml  Output 750 ml  Net 1977.17 ml     Wt Readings from Last 3 Encounters:  09/24/17 70.8 kg (156 lb 1.4 oz)  09/10/17 76.7 kg (169 lb)  08/31/17 80 kg (176 lb 5.9 oz)     Exam   General: Alert and oriented x 3, NAD  Eyes:   HEENT:    Cardiovascular: S1 S2 auscultated, Regular rate and rhythm. No pedal edema b/l  Respiratory: Clear to auscultation bilaterally, no wheezing, rales or rhonchi  Gastrointestinal: Soft, nontender, nondistended, + bowel sounds  Ext: no pedal edema bilaterally  Neuro: no neuro deficits  Musculoskeletal: No digital cyanosis, clubbing  Skin: No rashes  Psych: Normal affect and demeanor, alert and oriented x3    Data Reviewed:  I have personally reviewed following labs and imaging studies  Micro Results Recent Results (from the past 240 hour(s))  C difficile quick scan w PCR reflex     Status: None   Collection Time: 09/21/17  2:33 PM  Result Value Ref Range Status   C Diff antigen NEGATIVE NEGATIVE Final   C Diff toxin NEGATIVE NEGATIVE Final   C Diff interpretation No C. difficile detected.  Final    Comment: Performed at [facility], [place] [address], [place], [place] [address]  Gastrointestinal Panel by PCR , Stool     Status: Abnormal   Collection Time: 09/21/17  2:33 PM  Result Value Ref Range Status   Campylobacter species  NOT DETECTED NOT DETECTED Final   Plesimonas shigelloides NOT DETECTED NOT DETECTED Final   Salmonella species NOT DETECTED NOT DETECTED Final   Yersinia enterocolitica NOT DETECTED NOT DETECTED Final   Vibrio species NOT DETECTED NOT DETECTED Final   Vibrio cholerae NOT DETECTED NOT DETECTED Final   Enteroaggregative E coli (EAEC) NOT DETECTED  NOT DETECTED Final   Enteropathogenic E coli (EPEC) DETECTED (A) NOT DETECTED Final    Comment: RESULT CALLED TO, READ BACK BY AND VERIFIED WITH: ANITA PRIDDIE @1636  09/22/17 AKT    Enterotoxigenic E coli (ETEC) NOT DETECTED NOT DETECTED Final   Shiga like toxin producing E coli (STEC) NOT DETECTED NOT DETECTED Final   Shigella/Enteroinvasive E coli (EIEC) NOT DETECTED NOT DETECTED Final   Cryptosporidium NOT DETECTED NOT DETECTED Final   Cyclospora cayetanensis NOT DETECTED NOT DETECTED Final   Entamoeba histolytica NOT DETECTED NOT DETECTED Final   Giardia lamblia NOT DETECTED NOT DETECTED Final   Adenovirus F40/41 NOT DETECTED NOT DETECTED Final   Astrovirus NOT DETECTED NOT DETECTED Final   Norovirus GI/GII NOT DETECTED NOT DETECTED Final   Rotavirus A NOT DETECTED NOT DETECTED Final   Sapovirus (I, II, IV, and V) NOT DETECTED NOT DETECTED Final    Comment: Performed at [facility], [address] [place], [place] [address]    Radiology Reports Ct Abdomen Pelvis Wo Contrast  Result Date: 09/23/2017 CLINICAL DATA:  Diarrhea and nausea for 1 week. History of prior bowel obstruction. EXAM: CT ABDOMEN AND PELVIS WITHOUT CONTRAST TECHNIQUE: Multidetector CT imaging of the abdomen and pelvis was performed following the standard protocol without IV contrast. COMPARISON:  03/24/2014 FINDINGS: Lower chest: Lung bases are normal. Calcified plaque over the lateral circumflex coronary artery. Tiny amount of pericardial fluid. Hepatobiliary: Previous cholecystectomy. Liver and biliary tree are otherwise unremarkable. Pancreas: Normal. Spleen:  Several small calcified granulomas, otherwise normal. Adrenals/Urinary Tract: Adrenal glands are normal and symmetric. Kidneys are normal in size without hydronephrosis or nephrolithiasis. Ureters are normal caliber without stones. Bladder is normal. Stomach/Bowel: Stomach and small bowel are normal. Appendix is normal. Colon is normal Vascular/Lymphatic: Vascular structures are within normal. Stable 1.8 cm low-density nodule in the right retrocrural space likely a varix. Reproductive: Normal. Other: No free fluid or focal inflammatory change. Musculoskeletal: Normal. IMPRESSION: No acute findings in the abdomen/pelvis. Electronically Signed   By: Marin Olp M.D.   On: 09/23/2017 09:06   Dg Chest 2 View  Result Date: 09/22/2017 CLINICAL DATA:  Cough. EXAM: CHEST - 2 VIEW COMPARISON:  Chest x-ray dated Aug 26, 2017. FINDINGS: Stable borderline cardiomegaly. Normal pulmonary vascularity. No focal consolidation, pleural effusion, or pneumothorax. No acute osseous abnormality. IMPRESSION: No active cardiopulmonary disease. Electronically Signed   By: Titus Dubin M.D.   On: 09/22/2017 11:55   Dg Chest 2 View  Result Date: 08/26/2017 CLINICAL DATA:  Chest pain EXAM: CHEST - 2 VIEW COMPARISON:  July 30, 2016 FINDINGS: There is no edema or consolidation. Heart is borderline enlarged with pulmonary vascularity normal. No adenopathy. No bone lesions. IMPRESSION: Heart borderline enlarged.  No edema or consolidation. Electronically Signed   By: Lowella Grip III M.D.   On: 08/26/2017 21:45    Lab Data:  CBC: Recent Labs  Lab 09/21/17 1035 09/22/17 0515 09/23/17 0657 09/24/17 0806  WBC 15.2* 7.4 6.0 7.4  HGB 10.5* 11.3* 10.0* 10.7*  HCT 33.5* 35.8* 32.0* 35.4*  MCV 90.8 89.5 89.4 91.9  PLT 140* 141* 121* 540*   Basic Metabolic Panel: Recent Labs  Lab 09/21/17 1035 09/22/17 0515 09/23/17 0657 09/24/17 0806 09/25/17 0348  NA 137 137 139 142 143  K 4.4 3.4* 2.8* 3.0* 3.2*  CL 111 104  95* 98* 105  CO2 12* 19* 30 29 26   GLUCOSE 89 105* 89 102* 122*  BUN 82* 71* 60* 50* 39*  CREATININE 5.72* 5.22* 6.07* 6.30* 5.40*  CALCIUM 8.3* 7.9* 7.1* 7.3* 6.6*   GFR: Estimated Creatinine Clearance: 11.4 mL/min (A) (by C-G formula based on SCr of 5.4 mg/dL (H)). Liver Function Tests: Recent Labs  Lab 09/21/17 1035 09/22/17 0515 09/23/17 0657  AST 12* 11* 11*  ALT 11* 11* 10*  ALKPHOS 67 61 51  BILITOT 0.6 0.4 0.5  PROT 6.8 6.4* 5.9*  ALBUMIN 3.6 3.3* 3.0*   Recent Labs  Lab 09/21/17 1035  LIPASE 55*   No results for input(s): AMMONIA in the last 168 hours. Coagulation Profile: No results for input(s): INR, PROTIME in the last 168 hours. Cardiac Enzymes: No results for input(s): CKTOTAL, CKMB, CKMBINDEX, TROPONINI in the last 168 hours. BNP (last 3 results) No results for input(s): PROBNP in the last 8760 hours. HbA1C: No results for input(s): HGBA1C in the last 72 hours. CBG: No results for input(s): GLUCAP in the last 168 hours. Lipid Profile: No results for input(s): CHOL, HDL, LDLCALC, TRIG, CHOLHDL, LDLDIRECT in the last 72 hours. Thyroid Function Tests: No results for input(s): TSH, T4TOTAL, FREET4, T3FREE, THYROIDAB in the last 72 hours. Anemia Panel: No results for input(s): VITAMINB12, FOLATE, FERRITIN, TIBC, IRON, RETICCTPCT in the last 72 hours. Urine analysis:    Component Value Date/Time   COLORURINE YELLOW 09/21/2017 2028   APPEARANCEUR HAZY (A) 09/21/2017 2028   LABSPEC 1.010 09/21/2017 2028   PHURINE 5.0 09/21/2017 2028   GLUCOSEU NEGATIVE 09/21/2017 2028   HGBUR SMALL (A) 09/21/2017 2028   BILIRUBINUR NEGATIVE 09/21/2017 2028   KETONESUR NEGATIVE 09/21/2017 2028   PROTEINUR >=300 (A) 09/21/2017 2028   UROBILINOGEN 0.2 03/24/2014 1420   NITRITE NEGATIVE 09/21/2017 2028   LEUKOCYTESUR NEGATIVE 09/21/2017 2028     Ripudeep Rai M.D. [facility] 09/25/2017, 11:08 AM  Pager: [phone] Between 7am to 7pm - call Pager -  [phone]  After 7pm go to [email or web address] - password [email or web address]  Call night coverage person covering after 7pm

## 2017-09-25 NOTE — Care Management Important Message (Signed)
Important Message  Patient Details  Name: Lindsey York MRN: [id number] Date of Birth: 1965/11/01   Medicare Important Message Given:  Yes    Carles Collet, RN 09/25/2017, 1:25 PM

## 2017-09-26 ENCOUNTER — Telehealth: Payer: Self-pay | Admitting: Physician Assistant

## 2017-09-26 DIAGNOSIS — M3214 Glomerular disease in systemic lupus erythematosus: Secondary | ICD-10-CM

## 2017-09-26 DIAGNOSIS — E876 Hypokalemia: Secondary | ICD-10-CM

## 2017-09-26 DIAGNOSIS — A09 Infectious gastroenteritis and colitis, unspecified: Secondary | ICD-10-CM

## 2017-09-26 DIAGNOSIS — N184 Chronic kidney disease, stage 4 (severe): Secondary | ICD-10-CM

## 2017-09-26 DIAGNOSIS — N170 Acute kidney failure with tubular necrosis: Secondary | ICD-10-CM

## 2017-09-26 DIAGNOSIS — E872 Acidosis: Secondary | ICD-10-CM

## 2017-09-26 DIAGNOSIS — R05 Cough: Secondary | ICD-10-CM

## 2017-09-26 LAB — BASIC METABOLIC PANEL
Anion gap: 9 (ref 5–15)
BUN: 35 mg/dL — AB (ref 6–20)
CO2: 24 mmol/L (ref 22–32)
CREATININE: 5.08 mg/dL — AB (ref 0.44–1.00)
Calcium: 6.8 mg/dL — ABNORMAL LOW (ref 8.9–10.3)
Chloride: 110 mmol/L (ref 98–111)
GFR calc Af Amer: 10 mL/min — ABNORMAL LOW (ref >60)
GFR, EST NON AFRICAN AMERICAN: 9 mL/min — AB (ref >60)
GLUCOSE: 96 mg/dL (ref 70–99)
Potassium: 3.3 mmol/L — ABNORMAL LOW (ref 3.5–5.1)
Sodium: 143 mmol/L (ref 135–145)

## 2017-09-26 MED ORDER — POTASSIUM CHLORIDE CRYS ER 20 MEQ PO TBCR
40.0000 meq | EXTENDED_RELEASE_TABLET | Freq: Once | ORAL | Status: AC
  Administered 2017-09-26: 40 meq via ORAL
  Filled 2017-09-26: qty 2

## 2017-09-26 MED ORDER — GUAIFENESIN-DM 100-10 MG/5ML PO SYRP: 5.0000 mL | ORAL_SOLUTION | ORAL | 0 refills | Status: DC | PRN

## 2017-09-26 MED ORDER — PROMETHAZINE HCL 25 MG PO TABS
12.5000 mg | ORAL_TABLET | Freq: Once | ORAL | Status: AC
  Administered 2017-09-26: 12.5 mg via ORAL
  Filled 2017-09-26: qty 1

## 2017-09-26 NOTE — Telephone Encounter (Signed)
Returned call to patient of Dr. Gwenlyn Found. She is currently in hospital for bacterial infection and was notified that her renal function has worsened. She had cath/stent in May and needs another stent but she also needs dialysis. She states her nephrologist Dr. Cyndie Chime ([place]) would like her to have peritoneal dialysis but she would need surgery for this and she would like to know if she would be OK to have surgery before having the second stent that she needs.   Message routed to Dr. Gwenlyn Found

## 2017-09-26 NOTE — Telephone Encounter (Signed)
New message:       Pt states she had an stent put in and needing to get some information to give to her kidney dr. Abbott Pao states she is currently in the hospital and needs this information as soon as possible.

## 2017-09-26 NOTE — Discharge Summary (Signed)
Physician Discharge Summary  Lindsey York EXH:[id number] DOB: 1965/04/28 DOA: 09/21/2017  PCP: System, Pcp Not In  Admit date: 09/21/2017 Discharge date: 09/26/2017  Admitted From: Home Disposition: Home  Recommendations for Outpatient Follow-up:  1. Follow up with nephrologist in 1 week (has appointment)  Home Health: None Equipment/Devices: None  Discharge Condition: Fair CODE STATUS: Full code Diet recommendation: Heart Healthy /renal    Discharge Diagnoses:  Principal Problem:   Acute kidney injury superimposed on CKD stage IV ([place]) With metabolic acidosis.  No uremic symptoms.  Suspect due to persistent diarrhea and dehydration.  Resume home dose prednisone upon discharge.  Nephrology consult appreciated.  Renal function moving to normal with hydration and has good urine output.  No need for urgent dialysis. Given her global scarring on her renal biopsy recommends her immunosuppressive therapy may not be helpful and will hold CellCept in the setting of worsening renal function and diarrhea. Patient is interested in peritoneal dialysis and will follow up with her nephrologist at [facility] next week. Instructed to avoid nephrotoxins.  Lupus nephritis Stopping CellCept.  Continue oral prednisone.  Infectious diarrhea GI pathogen panel showing light.  Treated with 3 days of ciprofloxacin.  Now resolved.  Hypokalemia Replenished.  Essential hypertension Stable.  Home meds.  CAD with recent NSTEMI Recent cardiac cath 1 month ago she is currently on aspirin, Brilinta, Coreg and statin.  Continue home medications and outpatient follow-up with cardiology as scheduled.  Procedure: CT abdomen  Consultant: Nephrology  Disposition: Home Family to medication: None at bedside   Discharge Instructions   Allergies as of 09/26/2017      Reactions   Hydroxychloroquine Hives, Rash   Codeine       Medication List    STOP taking these medications   mycophenolate 250 MG  capsule Commonly known as:  CELLCEPT   mycophenolate 500 MG tablet Commonly known as:  CELLCEPT     TAKE these medications   acetaminophen 325 MG tablet Commonly known as:  TYLENOL Take 650 mg by mouth every 6 (six) hours as needed for moderate pain.   allopurinol 100 MG tablet Commonly known as:  ZYLOPRIM Take 100 mg by mouth daily.   amLODipine 10 MG tablet Commonly known as:  NORVASC Take 10 mg by mouth daily.   ARANESP (ALB FREE) SURECLICK IJ Inject 381 mg as directed every 14 (fourteen) days.   aspirin 81 MG chewable tablet Chew 1 tablet (81 mg total) by mouth daily.   AVASTIN 400 MG/16ML Soln Generic drug:  bevacizumab Inject 1.25 mg as directed every 30 (thirty) days.   benzonatate 100 MG capsule Commonly known as:  TESSALON Take 100 mg by mouth 2 (two) times daily as needed for cough.   calcitRIOL 0.25 MCG capsule Commonly known as:  ROCALTROL Take 0.25 mcg by mouth 3 (three) times a week. Monday, Wednesday, Friday   carvedilol 6.25 MG tablet Commonly known as:  COREG Take 1 tablet (6.25 mg total) by mouth 2 (two) times daily with a meal.   cloNIDine 0.1 MG tablet Commonly known as:  CATAPRES Take 1 tab at 8am, 4pm and midnight   fluticasone 50 MCG/ACT nasal spray Commonly known as:  FLONASE Place 1 spray into the nose daily.   furosemide 80 MG tablet Commonly known as:  LASIX Take 1 tablet (80 mg total) by mouth every other day. Take 1 tablet daily for 3 days and then go back to taking 1 tablet every other day.   guaiFENesin-dextromethorphan 100-10 MG/5ML syrup  Commonly known as:  ROBITUSSIN DM Take 5 mLs by mouth every 4 (four) hours as needed for cough.   hydrALAZINE 25 MG tablet Commonly known as:  APRESOLINE Take 1 tablet (25 mg total) by mouth 3 (three) times daily. Take 1 tab at 8am, 4pm, and midnight   lovastatin 40 MG tablet Commonly known as:  MEVACOR Take 1 tablet (40 mg total) by mouth at bedtime.   nitroGLYCERIN 0.4 MG SL  tablet Commonly known as:  NITROSTAT Place 1 tablet (0.4 mg total) under the tongue every 5 (five) minutes as needed for chest pain.   oxybutynin 5 MG tablet Commonly known as:  DITROPAN Take 5 mg by mouth daily as needed for bladder spasms.   predniSONE 1 MG tablet Commonly known as:  DELTASONE Take 1 mg by mouth daily with breakfast.   predniSONE 5 MG tablet Commonly known as:  DELTASONE Take 5 mg by mouth daily with breakfast.   ranitidine 150 MG tablet Commonly known as:  ZANTAC Take 150 mg by mouth every morning.   sodium bicarbonate 650 MG tablet Take 1 tablet (650 mg total) by mouth 2 (two) times daily.   ticagrelor 90 MG Tabs tablet Commonly known as:  BRILINTA Take 1 tablet (90 mg total) by mouth 2 (two) times daily.      Follow-up Information    nephrologist at [facility] Follow up.   Why:  has appt on 7/5         Allergies  Allergen Reactions  . Hydroxychloroquine Hives and Rash  . Codeine       Procedures/Studies: Ct Abdomen Pelvis Wo Contrast  Result Date: 09/23/2017 CLINICAL DATA:  Diarrhea and nausea for 1 week. History of prior bowel obstruction. EXAM: CT ABDOMEN AND PELVIS WITHOUT CONTRAST TECHNIQUE: Multidetector CT imaging of the abdomen and pelvis was performed following the standard protocol without IV contrast. COMPARISON:  03/24/2014 FINDINGS: Lower chest: Lung bases are normal. Calcified plaque over the lateral circumflex coronary artery. Tiny amount of pericardial fluid. Hepatobiliary: Previous cholecystectomy. Liver and biliary tree are otherwise unremarkable. Pancreas: Normal. Spleen: Several small calcified granulomas, otherwise normal. Adrenals/Urinary Tract: Adrenal glands are normal and symmetric. Kidneys are normal in size without hydronephrosis or nephrolithiasis. Ureters are normal caliber without stones. Bladder is normal. Stomach/Bowel: Stomach and small bowel are normal. Appendix is normal. Colon is normal Vascular/Lymphatic: Vascular  structures are within normal. Stable 1.8 cm low-density nodule in the right retrocrural space likely a varix. Reproductive: Normal. Other: No free fluid or focal inflammatory change. Musculoskeletal: Normal. IMPRESSION: No acute findings in the abdomen/pelvis. Electronically Signed   By: Marin Olp M.D.   On: 09/23/2017 09:06   Dg Chest 2 View  Result Date: 09/22/2017 CLINICAL DATA:  Cough. EXAM: CHEST - 2 VIEW COMPARISON:  Chest x-ray dated Aug 26, 2017. FINDINGS: Stable borderline cardiomegaly. Normal pulmonary vascularity. No focal consolidation, pleural effusion, or pneumothorax. No acute osseous abnormality. IMPRESSION: No active cardiopulmonary disease. Electronically Signed   By: Titus Dubin M.D.   On: 09/22/2017 11:55       Subjective: Overnight events.  No further diarrhea.  Feels better and wanting to go home.  Discharge Exam: Vitals:   09/26/17 0535 09/26/17 0748  BP: (!) 128/92 131/83  Pulse: 93 92  Resp: 16 18  Temp: 98.2 F (36.8 C) 98.1 F (36.7 C)  SpO2: 95% 97%   Vitals:   09/25/17 2318 09/26/17 0535 09/26/17 0748 09/26/17 0800  BP: (!) 141/90 (!) 128/92 131/83  Pulse: 97 93 92   Resp: 16 16 18    Temp: 98.2 F (36.8 C) 98.2 F (36.8 C) 98.1 F (36.7 C)   TempSrc: Oral Oral Oral   SpO2: 96% 95% 97%   Weight:    74.4 kg (164 lb)  Height:        General: Not in distress HEENT: Moist mucosa, supple neck Chest: Clear bilaterally CVS: Normal S1 and S2, no murmurs GI: Soft, nondistended, nontender Musculoskeletal: Warm, no edema   The results of significant diagnostics from this hospitalization (including imaging, microbiology, ancillary and laboratory) are listed below for reference.     Microbiology: Recent Results (from the past 240 hour(s))  C difficile quick scan w PCR reflex     Status: None   Collection Time: 09/21/17  2:33 PM  Result Value Ref Range Status   C Diff antigen NEGATIVE NEGATIVE Final   C Diff toxin NEGATIVE NEGATIVE Final    C Diff interpretation No C. difficile detected.  Final    Comment: Performed at [facility], [place] [address] [place], [place] [address]  Gastrointestinal Panel by PCR , Stool     Status: Abnormal   Collection Time: 09/21/17  2:33 PM  Result Value Ref Range Status   Campylobacter species NOT DETECTED NOT DETECTED Final   Plesimonas shigelloides NOT DETECTED NOT DETECTED Final   Salmonella species NOT DETECTED NOT DETECTED Final   Yersinia enterocolitica NOT DETECTED NOT DETECTED Final   Vibrio species NOT DETECTED NOT DETECTED Final   Vibrio cholerae NOT DETECTED NOT DETECTED Final   Enteroaggregative E coli (EAEC) NOT DETECTED NOT DETECTED Final   Enteropathogenic E coli (EPEC) DETECTED (A) NOT DETECTED Final    Comment: RESULT CALLED TO, READ BACK BY AND VERIFIED WITH: ANITA PRIDDIE @1636  09/22/17 AKT    Enterotoxigenic E coli (ETEC) NOT DETECTED NOT DETECTED Final   Shiga like toxin producing E coli (STEC) NOT DETECTED NOT DETECTED Final   Shigella/Enteroinvasive E coli (EIEC) NOT DETECTED NOT DETECTED Final   Cryptosporidium NOT DETECTED NOT DETECTED Final   Cyclospora cayetanensis NOT DETECTED NOT DETECTED Final   Entamoeba histolytica NOT DETECTED NOT DETECTED Final   Giardia lamblia NOT DETECTED NOT DETECTED Final   Adenovirus F40/41 NOT DETECTED NOT DETECTED Final   Astrovirus NOT DETECTED NOT DETECTED Final   Norovirus GI/GII NOT DETECTED NOT DETECTED Final   Rotavirus A NOT DETECTED NOT DETECTED Final   Sapovirus (I, II, IV, and V) NOT DETECTED NOT DETECTED Final    Comment: Performed at [facility], [address] [place], [place] [address]     Labs: BNP (last 3 results) No results for input(s): BNP in the last 8760 hours. Basic Metabolic Panel: Recent Labs  Lab 09/22/17 0515 09/23/17 0657 09/24/17 0806 09/25/17 0348 09/26/17 0602  NA 137 139 142 143 143  K 3.4* 2.8* 3.0* 3.2* 3.3*  CL 104 95* 98* 105 110  CO2 19* 30 29 26 24   GLUCOSE 105*  89 102* 122* 96  BUN 71* 60* 50* 39* 35*  CREATININE 5.22* 6.07* 6.30* 5.40* 5.08*  CALCIUM 7.9* 7.1* 7.3* 6.6* 6.8*   Liver Function Tests: Recent Labs  Lab 09/21/17 1035 09/22/17 0515 09/23/17 0657  AST 12* 11* 11*  ALT 11* 11* 10*  ALKPHOS 67 61 51  BILITOT 0.6 0.4 0.5  PROT 6.8 6.4* 5.9*  ALBUMIN 3.6 3.3* 3.0*   Recent Labs  Lab 09/21/17 1035  LIPASE 55*   No results for  input(s): AMMONIA in the last 168 hours. CBC: Recent Labs  Lab 09/21/17 1035 09/22/17 0515 09/23/17 0657 09/24/17 0806  WBC 15.2* 7.4 6.0 7.4  HGB 10.5* 11.3* 10.0* 10.7*  HCT 33.5* 35.8* 32.0* 35.4*  MCV 90.8 89.5 89.4 91.9  PLT 140* 141* 121* 147*   Cardiac Enzymes: No results for input(s): CKTOTAL, CKMB, CKMBINDEX, TROPONINI in the last 168 hours. BNP: Invalid input(s): POCBNP CBG: No results for input(s): GLUCAP in the last 168 hours. D-Dimer No results for input(s): DDIMER in the last 72 hours. Hgb A1c No results for input(s): HGBA1C in the last 72 hours. Lipid Profile No results for input(s): CHOL, HDL, LDLCALC, TRIG, CHOLHDL, LDLDIRECT in the last 72 hours. Thyroid function studies No results for input(s): TSH, T4TOTAL, T3FREE, THYROIDAB in the last 72 hours.  Invalid input(s): FREET3 Anemia work up No results for input(s): VITAMINB12, FOLATE, FERRITIN, TIBC, IRON, RETICCTPCT in the last 72 hours. Urinalysis    Component Value Date/Time   COLORURINE YELLOW 09/21/2017 2028   APPEARANCEUR HAZY (A) 09/21/2017 2028   LABSPEC 1.010 09/21/2017 2028   PHURINE 5.0 09/21/2017 2028   GLUCOSEU NEGATIVE 09/21/2017 2028   HGBUR SMALL (A) 09/21/2017 2028   BILIRUBINUR NEGATIVE 09/21/2017 2028   KETONESUR NEGATIVE 09/21/2017 2028   PROTEINUR >=300 (A) 09/21/2017 2028   UROBILINOGEN 0.2 03/24/2014 1420   NITRITE NEGATIVE 09/21/2017 2028   LEUKOCYTESUR NEGATIVE 09/21/2017 2028   Sepsis Labs Invalid input(s): PROCALCITONIN,  WBC,  LACTICIDVEN Microbiology Recent Results (from the  past 240 hour(s))  C difficile quick scan w PCR reflex     Status: None   Collection Time: 09/21/17  2:33 PM  Result Value Ref Range Status   C Diff antigen NEGATIVE NEGATIVE Final   C Diff toxin NEGATIVE NEGATIVE Final   C Diff interpretation No C. difficile detected.  Final    Comment: Performed at [facility], [place] [address] [place], [place] [address]  Gastrointestinal Panel by PCR , Stool     Status: Abnormal   Collection Time: 09/21/17  2:33 PM  Result Value Ref Range Status   Campylobacter species NOT DETECTED NOT DETECTED Final   Plesimonas shigelloides NOT DETECTED NOT DETECTED Final   Salmonella species NOT DETECTED NOT DETECTED Final   Yersinia enterocolitica NOT DETECTED NOT DETECTED Final   Vibrio species NOT DETECTED NOT DETECTED Final   Vibrio cholerae NOT DETECTED NOT DETECTED Final   Enteroaggregative E coli (EAEC) NOT DETECTED NOT DETECTED Final   Enteropathogenic E coli (EPEC) DETECTED (A) NOT DETECTED Final    Comment: RESULT CALLED TO, READ BACK BY AND VERIFIED WITH: ANITA PRIDDIE @1636  09/22/17 AKT    Enterotoxigenic E coli (ETEC) NOT DETECTED NOT DETECTED Final   Shiga like toxin producing E coli (STEC) NOT DETECTED NOT DETECTED Final   Shigella/Enteroinvasive E coli (EIEC) NOT DETECTED NOT DETECTED Final   Cryptosporidium NOT DETECTED NOT DETECTED Final   Cyclospora cayetanensis NOT DETECTED NOT DETECTED Final   Entamoeba histolytica NOT DETECTED NOT DETECTED Final   Giardia lamblia NOT DETECTED NOT DETECTED Final   Adenovirus F40/41 NOT DETECTED NOT DETECTED Final   Astrovirus NOT DETECTED NOT DETECTED Final   Norovirus GI/GII NOT DETECTED NOT DETECTED Final   Rotavirus A NOT DETECTED NOT DETECTED Final   Sapovirus (I, II, IV, and V) NOT DETECTED NOT DETECTED Final    Comment: Performed at [facility], [address] [place], [place] [address]     Time coordinating discharge: 35 minutes  SIGNED:   Louellen Molder, MD  [facility] 09/26/2017, 12:16 PM Pager   If 7PM-7AM, please contact night-coverage [email or web address] Password [email or web address]

## 2017-09-26 NOTE — Telephone Encounter (Signed)
I have not seen her in the office post hospital and she cannot come off her antiplatelet agent for surgery.

## 2017-09-26 NOTE — Telephone Encounter (Signed)
Patient has been called back. Documented in another phone note

## 2017-09-26 NOTE — Telephone Encounter (Signed)
Spoke with pt, the main concern is if the patient were to need to go on dialysis, would she need the stent prior to that or could she have the surgery for dialysis without the stent. Aware will discuss with dr berry on Friday and let her know. Pt agreed with this plan.

## 2017-09-27 ENCOUNTER — Encounter: Payer: Medicare HMO | Attending: Physician Assistant | Admitting: Skilled Nursing Facility1

## 2017-09-27 ENCOUNTER — Encounter: Payer: Self-pay | Admitting: Skilled Nursing Facility1

## 2017-09-27 DIAGNOSIS — Z713 Dietary counseling and surveillance: Secondary | ICD-10-CM | POA: Diagnosis not present

## 2017-09-27 DIAGNOSIS — I214 Non-ST elevation (NSTEMI) myocardial infarction: Secondary | ICD-10-CM | POA: Diagnosis not present

## 2017-09-27 DIAGNOSIS — N185 Chronic kidney disease, stage 5: Secondary | ICD-10-CM

## 2017-09-27 NOTE — Progress Notes (Signed)
  Assessment:  Primary concerns today: stage 5 kidney disease.   Pt states she got a Stent and has been in the hospital 2 times in the last few weeks. Pt states she had ecoli not knowing where she got it. Pt states she has had a blockage in her intestine. Pt states her appetite is pretty low. Pt statea she does not know whether she wants to do PD or HD. Pt states her CKD is due to her lupus.   Pt has an upbeat attitude and fully understands how to take care of herself.  MEDICATIONS: See List   DIETARY INTAKE:  Usual eating pattern includes 3 meals and 1 snacks per day.  Everyday foods include none states.  Avoided foods include none stated.    24-hr recall:  B ( AM): grits and fruit or honey nut bagel Snk ( AM):  L ( PM): tuna sandwich or fast food chicken nugget with small fry or grilled chicken sandwich from cook out Snk ( PM):  D ( PM): rice with baked chicken and green beans Snk ( PM): sometimes sherbert ice cream or popcicles  Beverages: water   Usual physical activity: ADL's  Estimated energy needs: 1800 calories 200 g carbohydrates 135 g protein 50 g fat  Progress Towards Goal(s):  In progress.    Intervention:  Nutrition counseling. Dietitian educated pt on CKD stage V. Goals: -Follow liberlized kidney diet  Teaching Method Utilized:  Visual Auditory Hands on  Barriers to learning/adherence to lifestyle change: none identified   Demonstrated degree of understanding via:  Teach Back   Monitoring/Evaluation:  Dietary intake, exercise,and body weight prn.

## 2017-09-28 NOTE — Telephone Encounter (Signed)
Discussed with dr berry, left message for patient to call and schedule follow up appointment.

## 2017-10-01 ENCOUNTER — Telehealth: Payer: Self-pay | Admitting: Cardiovascular Disease

## 2017-10-01 NOTE — Telephone Encounter (Signed)
Received records from [facility] on 10/01/17, Appt 12/20/17 @ 10:15AM. NV

## 2017-10-02 ENCOUNTER — Telehealth: Payer: Self-pay | Admitting: Cardiovascular Disease

## 2017-10-02 NOTE — Telephone Encounter (Signed)
New Message    Patient is calling because she received a phone call about rescheduling her appt with Dr. Gwenlyn Found. I did reschedule it from 9/9 to 8/2. Her medical records was sent from [facility] and she was wondering did they include the clearance information for her to have surgery? As well as is there an earlier appointment.  Please call to discuss.

## 2017-10-03 NOTE — Telephone Encounter (Signed)
Unable to reach pt or leave a message  

## 2017-10-05 ENCOUNTER — Other Ambulatory Visit: Payer: Self-pay | Admitting: Cardiovascular Disease

## 2017-10-05 MED ORDER — TICAGRELOR 90 MG PO TABS: 90.0000 mg | ORAL_TABLET | Freq: Two times a day (BID) | ORAL | 3 refills | Status: DC

## 2017-10-05 MED ORDER — CARVEDILOL 6.25 MG PO TABS: 6.2500 mg | ORAL_TABLET | Freq: Two times a day (BID) | ORAL | 3 refills | Status: DC

## 2017-10-05 NOTE — Telephone Encounter (Signed)
°*  STAT* If patient is at the pharmacy, call can be transferred to refill team.   1. Which medications need to be refilled? (please list name of each medication and dose if known)new prescription for Brilinta and Carvedilol-please call today if possible 2. Which pharmacy/location (including street and city if local pharmacy) is medication to be sent to?[facility] [phone]  3. Do they need a 30 day or 90 day supply? 180 for each and refills

## 2017-10-05 NOTE — Telephone Encounter (Signed)
Unable to reach pt or leave a message  

## 2017-10-16 ENCOUNTER — Telehealth: Payer: Self-pay | Admitting: Cardiovascular Disease

## 2017-10-16 NOTE — Telephone Encounter (Signed)
Received records from [facility] on 10/16/17, Appt 11/03/14 @ 10:15AM. NV

## 2017-11-02 ENCOUNTER — Encounter

## 2017-11-02 ENCOUNTER — Encounter: Payer: Self-pay | Admitting: Cardiovascular Disease

## 2017-11-02 ENCOUNTER — Ambulatory Visit (INDEPENDENT_AMBULATORY_CARE_PROVIDER_SITE_OTHER): Payer: Medicare HMO | Admitting: Cardiovascular Disease

## 2017-11-02 DIAGNOSIS — I10 Essential (primary) hypertension: Secondary | ICD-10-CM

## 2017-11-02 DIAGNOSIS — E7800 Pure hypercholesterolemia, unspecified: Secondary | ICD-10-CM

## 2017-11-02 DIAGNOSIS — I214 Non-ST elevation (NSTEMI) myocardial infarction: Secondary | ICD-10-CM

## 2017-11-02 DIAGNOSIS — E785 Hyperlipidemia, unspecified: Secondary | ICD-10-CM | POA: Insufficient documentation

## 2017-11-02 NOTE — Patient Instructions (Signed)
    [facility] [address] [place] [address] Dept: [phone] Loc: [phone]  Nikki Pfluger  [birth year]  You are scheduled for a Cardiac Catheterization on Thursday, September 19 with Dr. Quay Burow.  1. Please arrive at the [facility] [place] [facility] (Main Entrance A) at [facility]: [address] [place], [place] [address] at 9:30 AM (This time is two hours before your procedure to ensure your preparation). Free valet parking service is available.   Special note: Every effort is made to have your procedure done on time. Please understand that emergencies sometimes delay scheduled procedures.  2. Diet: Do not eat solid foods after midnight.  The patient may have clear liquids until 5am upon the day of the procedure.  3. Labs: You will need to have blood drawn on 2 WEEKS PRIOR TO PROCEDURE-PRIOR TO EATING  4. Medication instructions in preparation for your procedure:  DO NOT TAKE FUROSEMIDE THE MORNING OF THE PROCEDURE  On the morning of your procedure, take your Brilinta/Ticagrelor and any morning medicines NOT listed above.  You may use sips of water.  5. Plan for one night stay--bring personal belongings. 6. Bring a current list of your medications and current insurance cards. 7. You MUST have a responsible person to drive you home. 8. Someone MUST be with you the first 24 hours after you arrive home or your discharge will be delayed. 9. Please wear clothes that are easy to get on and off and wear slip-on shoes.  Thank you for allowing [place] to care for you!   --  Invasive Cardiovascular services

## 2017-11-02 NOTE — Progress Notes (Signed)
06/08/486 [name]   11/09/1692  [id number]  Primary Physician System, Pcp Not In Primary Cardiologist: Lorretta Harp MD Garret Reddish, [place], [place]  HPI:  Lindsey York is a 52 y.o. moderately overweight divorced African-American female mother of 2 children who I am seeing today for post hospital follow-up after a recent non-STEMI, PCI and stenting of an obtuse marginal branch by Dr. Angelena Form.  She has a history of systemic lupus erythematosus, CKD 4 with serum creatinine is in the 5-6 range predialysis previously on the transplant list at [facility].  She also has history of treated hypertension hyperlipidemia.  She currently does not work.  She has never smoked.  She had a non-STEMI 08/26/2017 and catheterization performed by Dr. Angelena Form via the right femoral approach revealing a subtotally occluded first marginal branch which was stented with a synergy drug-eluting stent (2.25 mm x 20 mill meters).  She did have an 80% fairly focal mid dominant RCA stenosis which was not intervened on because of her renal insufficiency.  She is otherwise asymptomatic but wishes to continue on the renal transplant list which cannot occur until her RCA is intervened on.   Current Meds  Medication Sig  . acetaminophen (TYLENOL) 325 MG tablet Take 650 mg by mouth every 6 (six) hours as needed for moderate pain.  Marland Kitchen allopurinol (ZYLOPRIM) 100 MG tablet Take 100 mg by mouth daily.  Marland Kitchen amLODipine (NORVASC) 10 MG tablet Take 10 mg by mouth daily.  Marland Kitchen aspirin 81 MG chewable tablet Chew 1 tablet (81 mg total) by mouth daily.  . benzonatate (TESSALON) 100 MG capsule Take 100 mg by mouth 2 (two) times daily as needed for cough.  . bevacizumab (AVASTIN) 400 MG/16ML SOLN Inject 1.25 mg as directed every 30 (thirty) days.  . calcitRIOL (ROCALTROL) 0.25 MCG capsule Take 0.25 mcg by mouth 3 (three) times a week. Monday, Wednesday, Friday  . carvedilol (COREG) 6.25 MG tablet Take 1 tablet  (6.25 mg total) by mouth 2 (two) times daily with a meal.  . cloNIDine (CATAPRES) 0.1 MG tablet Take 1 tab at 8am, 4pm and midnight  . Darbepoetin Alfa-Polysorbate (ARANESP, ALB FREE, SURECLICK IJ) Inject 034 mg as directed every 14 (fourteen) days.   . fluticasone (FLONASE) 50 MCG/ACT nasal spray Place 1 spray into the nose daily.  . furosemide (LASIX) 80 MG tablet Take 1 tablet (80 mg total) by mouth every other day. Take 1 tablet daily for 3 days and then go back to taking 1 tablet every other day.  Marland Kitchen guaiFENesin-dextromethorphan (ROBITUSSIN DM) 100-10 MG/5ML syrup Take 5 mLs by mouth every 4 (four) hours as needed for cough.  . hydrALAZINE (APRESOLINE) 25 MG tablet Take 1 tablet (25 mg total) by mouth 3 (three) times daily. Take 1 tab at 8am, 4pm, and midnight  . lovastatin (MEVACOR) 40 MG tablet Take 1 tablet (40 mg total) by mouth at bedtime.  . nitroGLYCERIN (NITROSTAT) 0.4 MG SL tablet Place 1 tablet (0.4 mg total) under the tongue every 5 (five) minutes as needed for chest pain.  Marland Kitchen oxybutynin (DITROPAN) 5 MG tablet Take 5 mg by mouth daily as needed for bladder spasms.  . predniSONE (DELTASONE) 1 MG tablet Take 1 mg by mouth daily with breakfast.  . predniSONE (DELTASONE) 5 MG tablet Take 5 mg by mouth daily with breakfast.  . ranitidine (ZANTAC) 150 MG tablet Take 150 mg by mouth every morning.   . ticagrelor (BRILINTA) 90 MG TABS tablet  Take 1 tablet (90 mg total) by mouth 2 (two) times daily.     Allergies  Allergen Reactions  . Hydroxychloroquine Hives and Rash  . Codeine     Social History   Socioeconomic History  . Marital status: Divorced    Spouse name: Not on file  . Number of children: Not on file  . Years of education: Not on file  . Highest education level: Not on file  Occupational History  . Not on file  Social Needs  . Financial resource strain: Not on file  . Food insecurity:    Worry: Not on file    Inability: Not on file  . Transportation needs:     Medical: Not on file    Non-medical: Not on file  Tobacco Use  . Smoking status: Never Smoker  . Smokeless tobacco: Never Used  Substance and Sexual Activity  . Alcohol use: Never    Frequency: Never  . Drug use: Never  . Sexual activity: Yes  Lifestyle  . Physical activity:    Days per week: Not on file    Minutes per session: Not on file  . Stress: Not on file  Relationships  . Social connections:    Talks on phone: Not on file    Gets together: Not on file    Attends religious service: Not on file    Active member of club or organization: Not on file    Attends meetings of clubs or organizations: Not on file    Relationship status: Not on file  . Intimate partner violence:    Fear of current or ex partner: Not on file    Emotionally abused: Not on file    Physically abused: Not on file    Forced sexual activity: Not on file  Other Topics Concern  . Not on file  Social History Narrative  . Not on file     Review of Systems: General: negative for chills, fever, night sweats or weight changes.  Cardiovascular: negative for chest pain, dyspnea on exertion, edema, orthopnea, palpitations, paroxysmal nocturnal dyspnea or shortness of breath Dermatological: negative for rash Respiratory: negative for cough or wheezing Urologic: negative for hematuria Abdominal: negative for nausea, vomiting, diarrhea, bright red blood per rectum, melena, or hematemesis Neurologic: negative for visual changes, syncope, or dizziness All other systems reviewed and are otherwise negative except as noted above.    Blood pressure 132/87, pulse 70, height 5\' 3"  (1.6 m), weight 169 lb (76.7 kg).  General appearance: alert and no distress Neck: no adenopathy, no carotid bruit, no JVD, supple, symmetrical, trachea midline and thyroid not enlarged, symmetric, no tenderness/mass/nodules Lungs: clear to auscultation bilaterally Heart: regular rate and rhythm, S1, S2 normal, no murmur, click, rub or  gallop Extremities: extremities normal, atraumatic, no cyanosis or edema Pulses: 2+ and symmetric Skin: Skin color, texture, turgor normal. No rashes or lesions Neurologic: Alert and oriented X 3, normal strength and tone. Normal symmetric reflexes. Normal coordination and gait  EKG not performed today  ASSESSMENT AND PLAN:   NSTEMI (non-ST elevated myocardial infarction) ([place]) History of CAD status post non-STEMI cardiac catheterization performed by Dr. Angelena Form via the femoral approach revealing a subtotally occluded second obtuse marginal branch which underwent PCI and drug-eluting stenting.  She did have an 80% fairly focal mid dominant RCA stenosis which was not intervened on because of CKD 4, and being predialysis.  She is on dual antiplatelet therapy.  She denies chest pain or shortness of breath.  She wishes to to be on the renal transplant list but needs her RCA intervened on in order for this to occur.  Hypertension History of essential hypertension her blood pressure measured at 132/87.  She is on amlodipine, clonidine, carvedilol and hydralazine.  Hyperlipidemia History of hyperlipidemia on lovastatin.  We will recheck a lipid and liver profile.      Lorretta Harp MD FACP,FACC,FAHA, [facility] 11/02/2017 10:54 AM

## 2017-11-02 NOTE — Assessment & Plan Note (Signed)
History of hyperlipidemia on lovastatin.  We will recheck a lipid and liver profile.

## 2017-11-02 NOTE — Assessment & Plan Note (Signed)
History of CAD status post non-STEMI cardiac catheterization performed by Dr. Angelena Form via the femoral approach revealing a subtotally occluded second obtuse marginal branch which underwent PCI and drug-eluting stenting.  She did have an 80% fairly focal mid dominant RCA stenosis which was not intervened on because of CKD 4, and being predialysis.  She is on dual antiplatelet therapy.  She denies chest pain or shortness of breath.  She wishes to to be on the renal transplant list but needs her RCA intervened on in order for this to occur.

## 2017-11-02 NOTE — Assessment & Plan Note (Signed)
History of essential hypertension her blood pressure measured at 132/87.  She is on amlodipine, clonidine, carvedilol and hydralazine.

## 2017-11-06 NOTE — Telephone Encounter (Signed)
Patient seen in f/u 8/2 with Dr Gwenlyn Found

## 2017-11-16 ENCOUNTER — Telehealth: Payer: Self-pay | Admitting: Cardiovascular Disease

## 2017-11-16 NOTE — Telephone Encounter (Signed)
Returned pt's call. [email or web address] 

## 2017-11-16 NOTE — Telephone Encounter (Signed)
Spoke with pt. Pt sts that she began treatment for shingles on 11/14/17.  Adv her that she will have completed treatment for shingles this weekend. Her cardiac cath is scheduled with Dr.Berry on 12/20/17. She should no longer be contagious and ok to proceed with the procedure.  I will fwd an update to [facility] and we will call back if he has any recommendations.  Pt agreeable with plan and verbalized understanding.

## 2017-11-16 NOTE — Telephone Encounter (Signed)
New Message:      Pt states she had an appt with [facility] on 8/14 and she has a case of the shingles. Pt states her medication will be done this weekend. Pt states she also has a procedure on 8/19 and she needs to know is it okay to proceed or does it need to be rescheduled.

## 2017-12-10 DIAGNOSIS — H35713 Central serous chorioretinopathy, bilateral: Secondary | ICD-10-CM | POA: Insufficient documentation

## 2017-12-10 LAB — COMPREHENSIVE METABOLIC PANEL
ALBUMIN: 3.9 g/dL (ref 3.5–5.5)
ALK PHOS: 75 IU/L (ref 39–117)
ALT: 12 IU/L (ref 0–32)
AST: 12 IU/L (ref 0–40)
Albumin/Globulin Ratio: 1.2 (ref 1.2–2.2)
BUN / CREAT RATIO: 13 (ref 9–23)
BUN: 57 mg/dL — ABNORMAL HIGH (ref 6–24)
Bilirubin Total: 0.2 mg/dL (ref 0.0–1.2)
CO2: 21 mmol/L (ref 20–29)
CREATININE: 4.46 mg/dL — AB (ref 0.57–1.00)
Calcium: 8.7 mg/dL (ref 8.7–10.2)
Chloride: 107 mmol/L — ABNORMAL HIGH (ref 96–106)
GFR calc Af Amer: 12 mL/min/{1.73_m2} — ABNORMAL LOW (ref >59)
GFR calc non Af Amer: 11 mL/min/{1.73_m2} — ABNORMAL LOW (ref >59)
GLOBULIN, TOTAL: 3.2 g/dL (ref 1.5–4.5)
Glucose: 69 mg/dL (ref 65–99)
Potassium: 4.3 mmol/L (ref 3.5–5.2)
SODIUM: 145 mmol/L — AB (ref 134–144)
Total Protein: 7.1 g/dL (ref 6.0–8.5)

## 2017-12-10 LAB — CBC
Hematocrit: 39.9 % (ref 34.0–46.6)
Hemoglobin: 13 g/dL (ref 11.1–15.9)
MCH: 30.9 pg (ref 26.6–33.0)
MCHC: 32.6 g/dL (ref 31.5–35.7)
MCV: 95 fL (ref 79–97)
PLATELETS: 203 10*3/uL (ref 150–450)
RBC: 4.21 x10E6/uL (ref 3.77–5.28)
RDW: 17.7 % — AB (ref 12.3–15.4)
WBC: 12.7 10*3/uL — AB (ref 3.4–10.8)

## 2017-12-10 LAB — LIPID PANEL
CHOLESTEROL TOTAL: 170 mg/dL (ref 100–199)
Chol/HDL Ratio: 2.6 ratio (ref 0.0–4.4)
HDL: 66 mg/dL (ref >39)
LDL Calculated: 84 mg/dL (ref 0–99)
TRIGLYCERIDES: 98 mg/dL (ref 0–149)
VLDL CHOLESTEROL CAL: 20 mg/dL (ref 5–40)

## 2017-12-11 ENCOUNTER — Ambulatory Visit: Payer: Medicare HMO | Admitting: Family Medicine

## 2017-12-11 ENCOUNTER — Telehealth: Payer: Self-pay

## 2017-12-11 DIAGNOSIS — E785 Hyperlipidemia, unspecified: Secondary | ICD-10-CM

## 2017-12-11 MED ORDER — ATORVASTATIN CALCIUM 40 MG PO TABS: 40.0000 mg | ORAL_TABLET | Freq: Every day | ORAL | 3 refills | Status: DC

## 2017-12-11 NOTE — Telephone Encounter (Signed)
Pt aware of lab results and Dr.Berry's recommendation with verbal understanding.  Notes recorded by Lorretta Harp, MD on 12/10/2017 at 5:26 PM EDT Not quite at goal for secondary prevention on lovastatin. Change to atorvastatin 40 mg a day and recheck in 3 months.  Pt will stop Lovastatin. Pt will start Atorvastatin 40mg  daily Rx sent to the patient's pharmacy for Atorvastatin 40mg  daily.  Orders for lab in [place]. Pt will come to our office in 3 months for fasting labs

## 2017-12-11 NOTE — Telephone Encounter (Signed)
-----   Message from Lorretta Harp, MD sent at 12/10/2017  5:26 PM EDT ----- Not quite at goal for secondary prevention on lovastatin.  Change to atorvastatin 40 mg a day and recheck in 3 months.

## 2017-12-12 ENCOUNTER — Ambulatory Visit: Payer: Medicare HMO | Admitting: Cardiovascular Disease

## 2017-12-18 ENCOUNTER — Ambulatory Visit: Payer: Medicare HMO | Admitting: Cardiovascular Disease

## 2017-12-18 ENCOUNTER — Telehealth: Payer: Self-pay | Admitting: *Deleted

## 2017-12-18 ENCOUNTER — Ambulatory Visit: Payer: Medicare HMO | Admitting: Family Medicine

## 2017-12-18 NOTE — Telephone Encounter (Signed)
Pt contacted pre-coronary stent intervention scheduled at [facility] for: Thursday December 20, 2017 11:30 AM Verified arrival time and place: [facility] A at: 9:30 AM  No solid food after midnight prior to cath, clear liquids until 5 AM day of procedure. Verify allergies in Epic Verify no diabetes medications.  Hold: Furosemide -AM of procedure.  Except hold medications AM meds can be  taken pre-cath with sip of water including: ASA 81 mg Brilinta 90 mg  Confirm patient has responsible person to drive home post procedure and for 24 hours after you arrive home.   LMTCB to discuss instructions with patient.

## 2017-12-18 NOTE — Telephone Encounter (Addendum)
BMP 12/12/17 Cr 4.4 /GFR 12  reviewed with Dr Gwenlyn Found. Per Dr Jodean Lima Coronary Stent Intervention scheduled for 12/20/17 if patient not on dialysis.   I spoke with patient 12/18/17, she states she is not on dialysis. Pt states that she saw Dr Marval Regal last week, has been monitoring her kidney function regularly, he was aware that she was scheduled for coronary intervention this week.  Pt states she is not on renal transplant list at this time and cannot be put back on renal transplant list until she has coronary intervention. Pt states she is  okay with not having coronary intervention  at this time,but felt it was strongly recommended to have procedure and is  concerned about overall heart condition if she does not have it.  Pt would like to discuss situation and options with Dr Gwenlyn Found.  Pt advised I will forward to Dr Gwenlyn Found for review and follow-up.

## 2017-12-19 NOTE — Telephone Encounter (Signed)
Pt contacted as Dr. Gwenlyn Found and Dr. Marval Regal spoke agreed that she needs heart craterization and she knows risk to kidneys. Pt scheduled for 2:30pm Cath with intervention on 9/19 and will arrive at 9:30am for pre-hyration. Pt aware of plan and arrival time with no additional questions at this time.

## 2017-12-19 NOTE — Telephone Encounter (Signed)
Follow Patient:   Patient returning a call back.

## 2017-12-19 NOTE — Telephone Encounter (Signed)
error 

## 2017-12-20 ENCOUNTER — Ambulatory Visit (HOSPITAL_COMMUNITY): Admission: RE | Admit: 2017-12-20 | Payer: Medicare HMO | Source: Ambulatory Visit | Admitting: Cardiovascular Disease

## 2017-12-20 ENCOUNTER — Ambulatory Visit (HOSPITAL_COMMUNITY)
Admission: RE | Admit: 2017-12-20 | Discharge: 2017-12-20 | Disposition: A | Discharge status: Home / Self Care | Payer: Medicare HMO | Source: Ambulatory Visit | Attending: Cardiovascular Disease | Admitting: Cardiovascular Disease

## 2017-12-20 ENCOUNTER — Encounter (HOSPITAL_COMMUNITY): Admission: RE | Payer: Self-pay | Source: Ambulatory Visit

## 2017-12-20 ENCOUNTER — Encounter (HOSPITAL_COMMUNITY)
Admission: RE | Disposition: A | Discharge status: Home / Self Care | Payer: Self-pay | Source: Ambulatory Visit | Attending: Cardiovascular Disease

## 2017-12-20 DIAGNOSIS — Z7682 Awaiting organ transplant status: Secondary | ICD-10-CM | POA: Insufficient documentation

## 2017-12-20 DIAGNOSIS — M329 Systemic lupus erythematosus, unspecified: Secondary | ICD-10-CM | POA: Insufficient documentation

## 2017-12-20 DIAGNOSIS — Z885 Allergy status to narcotic agent status: Secondary | ICD-10-CM | POA: Insufficient documentation

## 2017-12-20 DIAGNOSIS — I2510 Atherosclerotic heart disease of native coronary artery without angina pectoris: Secondary | ICD-10-CM | POA: Diagnosis present

## 2017-12-20 DIAGNOSIS — Z7982 Long term (current) use of aspirin: Secondary | ICD-10-CM | POA: Insufficient documentation

## 2017-12-20 DIAGNOSIS — Z7951 Long term (current) use of inhaled steroids: Secondary | ICD-10-CM | POA: Diagnosis not present

## 2017-12-20 DIAGNOSIS — I252 Old myocardial infarction: Secondary | ICD-10-CM | POA: Insufficient documentation

## 2017-12-20 DIAGNOSIS — Z538 Procedure and treatment not carried out for other reasons: Secondary | ICD-10-CM | POA: Diagnosis not present

## 2017-12-20 DIAGNOSIS — E785 Hyperlipidemia, unspecified: Secondary | ICD-10-CM | POA: Diagnosis not present

## 2017-12-20 DIAGNOSIS — Z7952 Long term (current) use of systemic steroids: Secondary | ICD-10-CM | POA: Diagnosis not present

## 2017-12-20 DIAGNOSIS — I129 Hypertensive chronic kidney disease with stage 1 through stage 4 chronic kidney disease, or unspecified chronic kidney disease: Secondary | ICD-10-CM | POA: Insufficient documentation

## 2017-12-20 DIAGNOSIS — N184 Chronic kidney disease, stage 4 (severe): Secondary | ICD-10-CM | POA: Insufficient documentation

## 2017-12-20 DIAGNOSIS — I214 Non-ST elevation (NSTEMI) myocardial infarction: Secondary | ICD-10-CM | POA: Diagnosis present

## 2017-12-20 SURGERY — CORONARY STENT INTERVENTION
Anesthesia: LOCAL

## 2017-12-20 MED ORDER — SODIUM CHLORIDE 0.9 % WEIGHT BASED INFUSION: 1.0000 mL/kg/h | INTRAVENOUS | Status: DC

## 2017-12-20 MED ORDER — SODIUM CHLORIDE 0.9% FLUSH: 3.0000 mL | Freq: Two times a day (BID) | INTRAVENOUS | Status: DC

## 2017-12-20 MED ORDER — SODIUM CHLORIDE 0.9% FLUSH: 3.0000 mL | INTRAVENOUS | Status: DC | PRN

## 2017-12-20 MED ORDER — ASPIRIN 81 MG PO CHEW: 81.0000 mg | CHEWABLE_TABLET | ORAL | Status: DC

## 2017-12-20 MED ORDER — SODIUM CHLORIDE 0.9 % IV SOLN: 250.0000 mL | INTRAVENOUS | Status: DC | PRN

## 2017-12-20 MED ORDER — SODIUM CHLORIDE 0.9 % WEIGHT BASED INFUSION
3.0000 mL/kg/h | INTRAVENOUS | Status: AC
  Administered 2017-12-20: 3 mL/kg/h via INTRAVENOUS

## 2017-12-20 NOTE — H&P (View-Only) (Signed)
Show:Clear all [x] Manual[x] Template[] Copied  Added by: [x] Lorretta Harp, MD  [] Hover for details     8/56[birth year]3149 Lindsey York   [birth year]  [id number]  Primary Physician System, [place] Not In Primary Cardiologist: Lorretta Harp MD Garret Reddish, [place], [place]  HPI:  Lindsey York is a 52 y.o. moderately overweight divorced African-American female mother of 2 children who I am seeing today for post hospital follow-up after a recent non-STEMI, PCI and stenting of an obtuse marginal branch by Dr. Angelena Form.  She has a history of systemic lupus erythematosus, CKD 4 with serum creatinine is in the 5-6 range predialysis previously on the transplant list at [facility].  She also has history of treated hypertension hyperlipidemia.  She currently does not work.  She has never smoked.  She had a non-STEMI 08/26/2017 and catheterization performed by Dr. Angelena Form via the right femoral approach revealing a subtotally occluded first marginal branch which was stented with a synergy drug-eluting stent (2.25 mm x 20 mill meters).  She did have an 80% fairly focal mid dominant RCA stenosis which was not intervened on because of her renal insufficiency.  She is otherwise asymptomatic but wishes to continue on the renal transplant list which cannot occur until her RCA is intervened on.       Current Meds  Medication Sig  . acetaminophen (TYLENOL) 325 MG tablet Take 650 mg by mouth every 6 (six) hours as needed for moderate pain.  Marland Kitchen allopurinol (ZYLOPRIM) 100 MG tablet Take 100 mg by mouth daily.  Marland Kitchen amLODipine (NORVASC) 10 MG tablet Take 10 mg by mouth daily.  Marland Kitchen aspirin 81 MG chewable tablet Chew 1 tablet (81 mg total) by mouth daily.  . benzonatate (TESSALON) 100 MG capsule Take 100 mg by mouth 2 (two) times daily as needed for cough.  . bevacizumab (AVASTIN) 400 MG/16ML SOLN Inject 1.25 mg as directed every 30 (thirty) days.  . calcitRIOL (ROCALTROL) 0.25 MCG  capsule Take 0.25 mcg by mouth 3 (three) times a week. Monday, Wednesday, Friday  . carvedilol (COREG) 6.25 MG tablet Take 1 tablet (6.25 mg total) by mouth 2 (two) times daily with a meal.  . cloNIDine (CATAPRES) 0.1 MG tablet Take 1 tab at 8am, 4pm and midnight  . Darbepoetin Alfa-Polysorbate (ARANESP, ALB FREE, SURECLICK IJ) Inject 412 mg as directed every 14 (fourteen) days.   . fluticasone (FLONASE) 50 MCG/ACT nasal spray Place 1 spray into the nose daily.  . furosemide (LASIX) 80 MG tablet Take 1 tablet (80 mg total) by mouth every other day. Take 1 tablet daily for 3 days and then go back to taking 1 tablet every other day.  Marland Kitchen guaiFENesin-dextromethorphan (ROBITUSSIN DM) 100-10 MG/5ML syrup Take 5 mLs by mouth every 4 (four) hours as needed for cough.  . hydrALAZINE (APRESOLINE) 25 MG tablet Take 1 tablet (25 mg total) by mouth 3 (three) times daily. Take 1 tab at 8am, 4pm, and midnight  . lovastatin (MEVACOR) 40 MG tablet Take 1 tablet (40 mg total) by mouth at bedtime.  . nitroGLYCERIN (NITROSTAT) 0.4 MG SL tablet Place 1 tablet (0.4 mg total) under the tongue every 5 (five) minutes as needed for chest pain.  Marland Kitchen oxybutynin (DITROPAN) 5 MG tablet Take 5 mg by mouth daily as needed for bladder spasms.  . predniSONE (DELTASONE) 1 MG tablet Take 1 mg by mouth daily with breakfast.  . predniSONE (DELTASONE) 5 MG tablet Take 5 mg by  mouth daily with breakfast.  . ranitidine (ZANTAC) 150 MG tablet Take 150 mg by mouth every morning.   . ticagrelor (BRILINTA) 90 MG TABS tablet Take 1 tablet (90 mg total) by mouth 2 (two) times daily.         Allergies  Allergen Reactions  . Hydroxychloroquine Hives and Rash  . Codeine     Social History        Socioeconomic History  . Marital status: Divorced    Spouse name: Not on file  . Number of children: Not on file  . Years of education: Not on file  . Highest education level: Not on file  Occupational History  . Not on file  Social  Needs  . Financial resource strain: Not on file  . Food insecurity:    Worry: Not on file    Inability: Not on file  . Transportation needs:    Medical: Not on file    Non-medical: Not on file  Tobacco Use  . Smoking status: Never Smoker  . Smokeless tobacco: Never Used  Substance and Sexual Activity  . Alcohol use: Never    Frequency: Never  . Drug use: Never  . Sexual activity: Yes  Lifestyle  . Physical activity:    Days per week: Not on file    Minutes per session: Not on file  . Stress: Not on file  Relationships  . Social connections:    Talks on phone: Not on file    Gets together: Not on file    Attends religious service: Not on file    Active member of club or organization: Not on file    Attends meetings of clubs or organizations: Not on file    Relationship status: Not on file  . Intimate partner violence:    Fear of current or ex partner: Not on file    Emotionally abused: Not on file    Physically abused: Not on file    Forced sexual activity: Not on file  Other Topics Concern  . Not on file  Social History Narrative  . Not on file     Review of Systems: General: negative for chills, fever, night sweats or weight changes.  Cardiovascular: negative for chest pain, dyspnea on exertion, edema, orthopnea, palpitations, paroxysmal nocturnal dyspnea or shortness of breath Dermatological: negative for rash Respiratory: negative for cough or wheezing Urologic: negative for hematuria Abdominal: negative for nausea, vomiting, diarrhea, bright red blood per rectum, melena, or hematemesis Neurologic: negative for visual changes, syncope, or dizziness All other systems reviewed and are otherwise negative except as noted above.    Blood pressure 132/87, pulse 70, height 5\' 3"  (1.6 m), weight 169 lb (76.7 kg).  General appearance: alert and no distress Neck: no adenopathy, no carotid bruit, no JVD, supple, symmetrical, trachea  midline and thyroid not enlarged, symmetric, no tenderness/mass/nodules Lungs: clear to auscultation bilaterally Heart: regular rate and rhythm, S1, S2 normal, no murmur, click, rub or gallop Extremities: extremities normal, atraumatic, no cyanosis or edema Pulses: 2+ and symmetric Skin: Skin color, texture, turgor normal. No rashes or lesions Neurologic: Alert and oriented X 3, normal strength and tone. Normal symmetric reflexes. Normal coordination and gait  EKG not performed today  ASSESSMENT AND PLAN:   NSTEMI (non-ST elevated myocardial infarction) ([place]) History of CAD status post non-STEMI cardiac catheterization performed by Dr. Angelena Form via the femoral approach revealing a subtotally she has residual mid RCA disease and presents now for elective PCI and stenting prior  to anticipated renal transplant.   Lorretta Harp, M.D., [place], [facility], [place] [address] [place], [place]  [address]  [phone] 12/20/2017 12:38 PM

## 2017-12-20 NOTE — H&P (Signed)
Show:Clear all [x] Manual[x] Template[] Copied  Added by: [x] Lorretta Harp, MD  [] Hover for details     0/17/5102 Rayel Santizo   [birth year]  [phone]  Primary Physician System, [place] Not In Primary Cardiologist: Lorretta Harp MD Garret Reddish, [place], [place]  HPI:  Lindsey York is a 52 y.o. moderately overweight divorced African-American female mother of 2 children who I am seeing today for post hospital follow-up after a recent non-STEMI, PCI and stenting of an obtuse marginal branch by Dr. Angelena Form.  She has a history of systemic lupus erythematosus, CKD 4 with serum creatinine is in the 5-6 range predialysis previously on the transplant list at [facility].  She also has history of treated hypertension hyperlipidemia.  She currently does not work.  She has never smoked.  She had a non-STEMI 08/26/2017 and catheterization performed by Dr. Angelena Form via the right femoral approach revealing a subtotally occluded first marginal branch which was stented with a synergy drug-eluting stent (2.25 mm x 20 mill meters).  She did have an 80% fairly focal mid dominant RCA stenosis which was not intervened on because of her renal insufficiency.  She is otherwise asymptomatic but wishes to continue on the renal transplant list which cannot occur until her RCA is intervened on.       Current Meds  Medication Sig  . acetaminophen (TYLENOL) 325 MG tablet Take 650 mg by mouth every 6 (six) hours as needed for moderate pain.  Marland Kitchen allopurinol (ZYLOPRIM) 100 MG tablet Take 100 mg by mouth daily.  Marland Kitchen amLODipine (NORVASC) 10 MG tablet Take 10 mg by mouth daily.  Marland Kitchen aspirin 81 MG chewable tablet Chew 1 tablet (81 mg total) by mouth daily.  . benzonatate (TESSALON) 100 MG capsule Take 100 mg by mouth 2 (two) times daily as needed for cough.  . bevacizumab (AVASTIN) 400 MG/16ML SOLN Inject 1.25 mg as directed every 30 (thirty) days.  . calcitRIOL (ROCALTROL) 0.25 MCG  capsule Take 0.25 mcg by mouth 3 (three) times a week. Monday, Wednesday, Friday  . carvedilol (COREG) 6.25 MG tablet Take 1 tablet (6.25 mg total) by mouth 2 (two) times daily with a meal.  . cloNIDine (CATAPRES) 0.1 MG tablet Take 1 tab at 8am, 4pm and midnight  . Darbepoetin Alfa-Polysorbate (ARANESP, ALB FREE, SURECLICK IJ) Inject 443 mg as directed every 14 (fourteen) days.   . fluticasone (FLONASE) 50 MCG/ACT nasal spray Place 1 spray into the nose daily.  . furosemide (LASIX) 80 MG tablet Take 1 tablet (80 mg total) by mouth every other day. Take 1 tablet daily for 3 days and then go back to taking 1 tablet every other day.  Marland Kitchen guaiFENesin-dextromethorphan (ROBITUSSIN DM) 100-10 MG/5ML syrup Take 5 mLs by mouth every 4 (four) hours as needed for cough.  . hydrALAZINE (APRESOLINE) 25 MG tablet Take 1 tablet (25 mg total) by mouth 3 (three) times daily. Take 1 tab at 8am, 4pm, and midnight  . lovastatin (MEVACOR) 40 MG tablet Take 1 tablet (40 mg total) by mouth at bedtime.  . nitroGLYCERIN (NITROSTAT) 0.4 MG SL tablet Place 1 tablet (0.4 mg total) under the tongue every 5 (five) minutes as needed for chest pain.  Marland Kitchen oxybutynin (DITROPAN) 5 MG tablet Take 5 mg by mouth daily as needed for bladder spasms.  . predniSONE (DELTASONE) 1 MG tablet Take 1 mg by mouth daily with breakfast.  . predniSONE (DELTASONE) 5 MG tablet Take 5 mg by  mouth daily with breakfast.  . ranitidine (ZANTAC) 150 MG tablet Take 150 mg by mouth every morning.   . ticagrelor (BRILINTA) 90 MG TABS tablet Take 1 tablet (90 mg total) by mouth 2 (two) times daily.         Allergies  Allergen Reactions  . Hydroxychloroquine Hives and Rash  . Codeine     Social History        Socioeconomic History  . Marital status: Divorced    Spouse name: Not on file  . Number of children: Not on file  . Years of education: Not on file  . Highest education level: Not on file  Occupational History  . Not on file  Social  Needs  . Financial resource strain: Not on file  . Food insecurity:    Worry: Not on file    Inability: Not on file  . Transportation needs:    Medical: Not on file    Non-medical: Not on file  Tobacco Use  . Smoking status: Never Smoker  . Smokeless tobacco: Never Used  Substance and Sexual Activity  . Alcohol use: Never    Frequency: Never  . Drug use: Never  . Sexual activity: Yes  Lifestyle  . Physical activity:    Days per week: Not on file    Minutes per session: Not on file  . Stress: Not on file  Relationships  . Social connections:    Talks on phone: Not on file    Gets together: Not on file    Attends religious service: Not on file    Active member of club or organization: Not on file    Attends meetings of clubs or organizations: Not on file    Relationship status: Not on file  . Intimate partner violence:    Fear of current or ex partner: Not on file    Emotionally abused: Not on file    Physically abused: Not on file    Forced sexual activity: Not on file  Other Topics Concern  . Not on file  Social History Narrative  . Not on file     Review of Systems: General: negative for chills, fever, night sweats or weight changes.  Cardiovascular: negative for chest pain, dyspnea on exertion, edema, orthopnea, palpitations, paroxysmal nocturnal dyspnea or shortness of breath Dermatological: negative for rash Respiratory: negative for cough or wheezing Urologic: negative for hematuria Abdominal: negative for nausea, vomiting, diarrhea, bright red blood per rectum, melena, or hematemesis Neurologic: negative for visual changes, syncope, or dizziness All other systems reviewed and are otherwise negative except as noted above.    Blood pressure 132/87, pulse 70, height 5\' 3"  (1.6 m), weight 169 lb (76.7 kg).  General appearance: alert and no distress Neck: no adenopathy, no carotid bruit, no JVD, supple, symmetrical, trachea  midline and thyroid not enlarged, symmetric, no tenderness/mass/nodules Lungs: clear to auscultation bilaterally Heart: regular rate and rhythm, S1, S2 normal, no murmur, click, rub or gallop Extremities: extremities normal, atraumatic, no cyanosis or edema Pulses: 2+ and symmetric Skin: Skin color, texture, turgor normal. No rashes or lesions Neurologic: Alert and oriented X 3, normal strength and tone. Normal symmetric reflexes. Normal coordination and gait  EKG not performed today  ASSESSMENT AND PLAN:   NSTEMI (non-ST elevated myocardial infarction) ([place]) History of CAD status post non-STEMI cardiac catheterization performed by Dr. Angelena Form via the femoral approach revealing a subtotally she has residual mid RCA disease and presents now for elective PCI and stenting prior  to anticipated renal transplant.   Lorretta Harp, M.D., [place], [facility], [place] [address] [place], [place]  [address]  [phone] 12/20/2017 12:38 PM

## 2017-12-20 NOTE — Progress Notes (Signed)
Pt procedure cancelled by Dr Gwenlyn Found to be rescheduled for 2 weeks.  Pt given instructions and food.  Pt understanding .  Discharged with family

## 2017-12-20 NOTE — Telephone Encounter (Signed)
I attempted to return call to patient, no answer at either phone number listed.

## 2017-12-24 ENCOUNTER — Other Ambulatory Visit: Payer: Self-pay | Admitting: *Deleted

## 2017-12-24 ENCOUNTER — Telehealth: Payer: Self-pay | Admitting: Cardiovascular Disease

## 2017-12-24 DIAGNOSIS — Z01812 Encounter for preprocedural laboratory examination: Secondary | ICD-10-CM

## 2017-12-24 DIAGNOSIS — I214 Non-ST elevation (NSTEMI) myocardial infarction: Secondary | ICD-10-CM

## 2017-12-24 DIAGNOSIS — N185 Chronic kidney disease, stage 5: Secondary | ICD-10-CM

## 2017-12-24 NOTE — Telephone Encounter (Signed)
Spoke to patient, patient wanted to confirm procedure is scheduled for 9/30.  Advised cath coordinator will be in contact with her with details by the end of day tomorrow.   Patient verbalized understanding.

## 2017-12-24 NOTE — Telephone Encounter (Signed)
Coronary Stent Intervention has been rescheduled to 12/31/17 2:30 PM, arrive 9:30 AM for pre-procedure hydration. Dr Gwenlyn Found is aware of rescheduled time and date.   I spoke with patient and she is aware to be at [facility] 12/31/17 9:30 AM for 2:30 PM procedure.  Pt will go to lab at [facility] 12/26/17 for BMP/CBC prior to procedure 12/31/17.

## 2017-12-24 NOTE — Telephone Encounter (Signed)
New Message   Patient is calling in reference to the rescheduling of her coronary stent. It appears that it has been rescheduled to the 30th but the patient wanted to confirm. Please call to discuss.

## 2017-12-24 NOTE — Addendum Note (Signed)
Addended by: Katrine Coho on: 12/24/2017 11:42 AM   Modules accepted: Orders

## 2017-12-26 LAB — BASIC METABOLIC PANEL
BUN/Creatinine Ratio: 12 (ref 9–23)
BUN: 59 mg/dL — AB (ref 6–24)
CO2: 18 mmol/L — AB (ref 20–29)
CREATININE: 4.74 mg/dL — AB (ref 0.57–1.00)
Calcium: 8.5 mg/dL — ABNORMAL LOW (ref 8.7–10.2)
Chloride: 105 mmol/L (ref 96–106)
GFR calc Af Amer: 11 mL/min/{1.73_m2} — ABNORMAL LOW (ref >59)
GFR calc non Af Amer: 10 mL/min/{1.73_m2} — ABNORMAL LOW (ref >59)
GLUCOSE: 101 mg/dL — AB (ref 65–99)
Potassium: 4.2 mmol/L (ref 3.5–5.2)
SODIUM: 142 mmol/L (ref 134–144)

## 2017-12-26 LAB — CBC
HEMATOCRIT: 35.6 % (ref 34.0–46.6)
HEMOGLOBIN: 11.8 g/dL (ref 11.1–15.9)
MCH: 30.5 pg (ref 26.6–33.0)
MCHC: 33.1 g/dL (ref 31.5–35.7)
MCV: 92 fL (ref 79–97)
PLATELETS: 208 10*3/uL (ref 150–450)
RBC: 3.87 x10E6/uL (ref 3.77–5.28)
RDW: 17.2 % — ABNORMAL HIGH (ref 12.3–15.4)
WBC: 9.5 10*3/uL (ref 3.4–10.8)

## 2017-12-30 ENCOUNTER — Other Ambulatory Visit: Payer: Self-pay

## 2017-12-30 DIAGNOSIS — I214 Non-ST elevation (NSTEMI) myocardial infarction: Secondary | ICD-10-CM

## 2017-12-31 ENCOUNTER — Other Ambulatory Visit: Payer: Self-pay

## 2017-12-31 ENCOUNTER — Encounter (HOSPITAL_COMMUNITY): Payer: Self-pay | Admitting: General Practice

## 2017-12-31 ENCOUNTER — Encounter (HOSPITAL_COMMUNITY)
Admission: RE | Disposition: A | Discharge status: Home / Self Care | Payer: Self-pay | Source: Ambulatory Visit | Attending: Cardiovascular Disease

## 2017-12-31 ENCOUNTER — Ambulatory Visit (HOSPITAL_COMMUNITY)
Admission: RE | Admit: 2017-12-31 | Discharge: 2018-01-01 | Disposition: A | Discharge status: Home / Self Care | Payer: Medicare HMO | Source: Ambulatory Visit | Attending: Cardiovascular Disease | Admitting: Cardiovascular Disease

## 2017-12-31 ENCOUNTER — Other Ambulatory Visit: Payer: Self-pay | Admitting: Cardiovascular Disease

## 2017-12-31 DIAGNOSIS — E785 Hyperlipidemia, unspecified: Secondary | ICD-10-CM | POA: Insufficient documentation

## 2017-12-31 DIAGNOSIS — I2510 Atherosclerotic heart disease of native coronary artery without angina pectoris: Secondary | ICD-10-CM | POA: Diagnosis present

## 2017-12-31 DIAGNOSIS — Z79899 Other long term (current) drug therapy: Secondary | ICD-10-CM | POA: Diagnosis not present

## 2017-12-31 DIAGNOSIS — M329 Systemic lupus erythematosus, unspecified: Secondary | ICD-10-CM | POA: Insufficient documentation

## 2017-12-31 DIAGNOSIS — E663 Overweight: Secondary | ICD-10-CM | POA: Insufficient documentation

## 2017-12-31 DIAGNOSIS — Z955 Presence of coronary angioplasty implant and graft: Secondary | ICD-10-CM | POA: Insufficient documentation

## 2017-12-31 DIAGNOSIS — Z940 Kidney transplant status: Secondary | ICD-10-CM | POA: Insufficient documentation

## 2017-12-31 DIAGNOSIS — Z6829 Body mass index (BMI) 29.0-29.9, adult: Secondary | ICD-10-CM | POA: Diagnosis not present

## 2017-12-31 DIAGNOSIS — I129 Hypertensive chronic kidney disease with stage 1 through stage 4 chronic kidney disease, or unspecified chronic kidney disease: Secondary | ICD-10-CM | POA: Insufficient documentation

## 2017-12-31 DIAGNOSIS — N184 Chronic kidney disease, stage 4 (severe): Secondary | ICD-10-CM | POA: Diagnosis not present

## 2017-12-31 DIAGNOSIS — I252 Old myocardial infarction: Secondary | ICD-10-CM | POA: Insufficient documentation

## 2017-12-31 DIAGNOSIS — Z7951 Long term (current) use of inhaled steroids: Secondary | ICD-10-CM | POA: Diagnosis not present

## 2017-12-31 DIAGNOSIS — Z7682 Awaiting organ transplant status: Secondary | ICD-10-CM | POA: Insufficient documentation

## 2017-12-31 DIAGNOSIS — I214 Non-ST elevation (NSTEMI) myocardial infarction: Secondary | ICD-10-CM | POA: Diagnosis not present

## 2017-12-31 DIAGNOSIS — Z7982 Long term (current) use of aspirin: Secondary | ICD-10-CM | POA: Insufficient documentation

## 2017-12-31 DIAGNOSIS — Z885 Allergy status to narcotic agent status: Secondary | ICD-10-CM | POA: Insufficient documentation

## 2017-12-31 HISTORY — PX: CORONARY STENT INTERVENTION: CATH118234

## 2017-12-31 HISTORY — PX: CORONARY ANGIOGRAPHY: CATH118303

## 2017-12-31 LAB — POCT ACTIVATED CLOTTING TIME
ACTIVATED CLOTTING TIME: 169 s
Activated Clotting Time: 362 seconds
Activated Clotting Time: 202 seconds

## 2017-12-31 SURGERY — CORONARY STENT INTERVENTION
Anesthesia: LOCAL

## 2017-12-31 MED ORDER — SODIUM CHLORIDE 0.9 % IV SOLN: 250.0000 mL | INTRAVENOUS | Status: DC | PRN

## 2017-12-31 MED ORDER — HEPARIN (PORCINE) IN NACL 1000-0.9 UT/500ML-% IV SOLN
INTRAVENOUS | Status: DC | PRN
  Administered 2017-12-31: 500 mL

## 2017-12-31 MED ORDER — CLONIDINE HCL 0.1 MG PO TABS
0.1000 mg | ORAL_TABLET | Freq: Three times a day (TID) | ORAL | Status: DC
  Administered 2018-01-01: 0.1 mg via ORAL
  Filled 2017-12-31: qty 1

## 2017-12-31 MED ORDER — SODIUM CHLORIDE 0.9% FLUSH: 3.0000 mL | INTRAVENOUS | Status: DC | PRN

## 2017-12-31 MED ORDER — ONDANSETRON HCL 4 MG/2ML IJ SOLN
4.0000 mg | Freq: Four times a day (QID) | INTRAMUSCULAR | Status: DC | PRN
  Filled 2017-12-31: qty 2

## 2017-12-31 MED ORDER — ACETAMINOPHEN 325 MG PO TABS: 650.0000 mg | ORAL_TABLET | Freq: Four times a day (QID) | ORAL | Status: DC | PRN

## 2017-12-31 MED ORDER — SODIUM CHLORIDE 0.9 % WEIGHT BASED INFUSION
3.0000 mL/kg/h | INTRAVENOUS | Status: DC
  Administered 2017-12-31: 3 mL/kg/h via INTRAVENOUS

## 2017-12-31 MED ORDER — ASPIRIN 81 MG PO CHEW: 81.0000 mg | CHEWABLE_TABLET | Freq: Every day | ORAL | Status: DC

## 2017-12-31 MED ORDER — FENTANYL CITRATE (PF) 100 MCG/2ML IJ SOLN
INTRAMUSCULAR | Status: AC
  Filled 2017-12-31: qty 2

## 2017-12-31 MED ORDER — SODIUM CHLORIDE 0.9 % WEIGHT BASED INFUSION: 1.0000 mL/kg/h | INTRAVENOUS | Status: DC

## 2017-12-31 MED ORDER — LIDOCAINE HCL (PF) 1 % IJ SOLN
INTRAMUSCULAR | Status: AC
  Filled 2017-12-31: qty 30

## 2017-12-31 MED ORDER — LIDOCAINE HCL (PF) 1 % IJ SOLN
INTRAMUSCULAR | Status: DC | PRN
  Administered 2017-12-31: 12 mL

## 2017-12-31 MED ORDER — PREDNISONE 1 MG PO TABS
1.0000 mg | ORAL_TABLET | Freq: Every day | ORAL | Status: DC
  Administered 2018-01-01: 1 mg via ORAL
  Filled 2017-12-31: qty 1

## 2017-12-31 MED ORDER — NITROGLYCERIN 0.4 MG SL SUBL: 0.4000 mg | SUBLINGUAL_TABLET | SUBLINGUAL | Status: DC | PRN

## 2017-12-31 MED ORDER — FUROSEMIDE 80 MG PO TABS
80.0000 mg | ORAL_TABLET | ORAL | Status: DC
  Filled 2017-12-31: qty 1

## 2017-12-31 MED ORDER — ATORVASTATIN CALCIUM 40 MG PO TABS
40.0000 mg | ORAL_TABLET | Freq: Every day | ORAL | Status: DC
  Administered 2017-12-31: 40 mg via ORAL
  Filled 2017-12-31 (×3): qty 1

## 2017-12-31 MED ORDER — TICAGRELOR 90 MG PO TABS: 90.0000 mg | ORAL_TABLET | Freq: Two times a day (BID) | ORAL | Status: DC

## 2017-12-31 MED ORDER — MIDAZOLAM HCL 2 MG/2ML IJ SOLN
INTRAMUSCULAR | Status: DC | PRN
  Administered 2017-12-31: 1 mg via INTRAVENOUS

## 2017-12-31 MED ORDER — HYDRALAZINE HCL 25 MG PO TABS
25.0000 mg | ORAL_TABLET | Freq: Three times a day (TID) | ORAL | Status: DC
  Administered 2017-12-31 – 2018-01-01 (×2): 25 mg via ORAL
  Filled 2017-12-31 (×2): qty 1

## 2017-12-31 MED ORDER — CARVEDILOL 3.125 MG PO TABS
6.2500 mg | ORAL_TABLET | Freq: Two times a day (BID) | ORAL | Status: DC
  Administered 2017-12-31 – 2018-01-01 (×2): 6.25 mg via ORAL
  Filled 2017-12-31 (×2): qty 2

## 2017-12-31 MED ORDER — SODIUM BICARBONATE 650 MG PO TABS
1300.0000 mg | ORAL_TABLET | Freq: Two times a day (BID) | ORAL | Status: DC
  Administered 2018-01-01: 1300 mg via ORAL
  Filled 2017-12-31: qty 2

## 2017-12-31 MED ORDER — PREDNISONE 5 MG PO TABS
5.0000 mg | ORAL_TABLET | Freq: Every day | ORAL | Status: DC
  Administered 2018-01-01: 08:00:00 5 mg via ORAL
  Filled 2017-12-31: qty 1

## 2017-12-31 MED ORDER — FAMOTIDINE 20 MG PO TABS
20.0000 mg | ORAL_TABLET | Freq: Every day | ORAL | Status: DC
  Administered 2018-01-01: 20 mg via ORAL
  Filled 2017-12-31: qty 1

## 2017-12-31 MED ORDER — HEPARIN (PORCINE) IN NACL 1000-0.9 UT/500ML-% IV SOLN
INTRAVENOUS | Status: AC
  Filled 2017-12-31: qty 1000

## 2017-12-31 MED ORDER — ASPIRIN 81 MG PO CHEW
81.0000 mg | CHEWABLE_TABLET | Freq: Every day | ORAL | Status: DC
  Administered 2018-01-01: 81 mg via ORAL
  Filled 2017-12-31: qty 1

## 2017-12-31 MED ORDER — SEVELAMER CARBONATE 800 MG PO TABS
1600.0000 mg | ORAL_TABLET | Freq: Three times a day (TID) | ORAL | Status: DC
  Administered 2017-12-31: 23:00:00 800 mg via ORAL
  Filled 2017-12-31: qty 2

## 2017-12-31 MED ORDER — ALLOPURINOL 100 MG PO TABS
100.0000 mg | ORAL_TABLET | Freq: Every day | ORAL | Status: DC
  Administered 2018-01-01: 10:00:00 100 mg via ORAL
  Filled 2017-12-31: qty 1

## 2017-12-31 MED ORDER — ACETAMINOPHEN 325 MG PO TABS: 650.0000 mg | ORAL_TABLET | ORAL | Status: DC | PRN

## 2017-12-31 MED ORDER — TICAGRELOR 90 MG PO TABS
90.0000 mg | ORAL_TABLET | Freq: Two times a day (BID) | ORAL | Status: DC
  Administered 2017-12-31 – 2018-01-01 (×2): 90 mg via ORAL
  Filled 2017-12-31 (×2): qty 1

## 2017-12-31 MED ORDER — MORPHINE SULFATE (PF) 2 MG/ML IV SOLN: 2.0000 mg | INTRAVENOUS | Status: DC | PRN

## 2017-12-31 MED ORDER — HYDRALAZINE HCL 20 MG/ML IJ SOLN
5.0000 mg | INTRAMUSCULAR | Status: AC | PRN
  Administered 2017-12-31: 5 mg via INTRAVENOUS
  Filled 2017-12-31: qty 1

## 2017-12-31 MED ORDER — SODIUM CHLORIDE 0.9% FLUSH
3.0000 mL | Freq: Two times a day (BID) | INTRAVENOUS | Status: DC
  Administered 2018-01-01: 10:00:00 3 mL via INTRAVENOUS

## 2017-12-31 MED ORDER — SODIUM CHLORIDE 0.9% FLUSH: 3.0000 mL | Freq: Two times a day (BID) | INTRAVENOUS | Status: DC

## 2017-12-31 MED ORDER — BIVALIRUDIN TRIFLUOROACETATE 250 MG IV SOLR
INTRAVENOUS | Status: AC
  Filled 2017-12-31: qty 250

## 2017-12-31 MED ORDER — SODIUM CHLORIDE 0.9 % IV SOLN
INTRAVENOUS | Status: DC | PRN
  Administered 2017-12-31: 1 mg/kg/h via INTRAVENOUS

## 2017-12-31 MED ORDER — SODIUM CHLORIDE 0.9 % IV SOLN
INTRAVENOUS | Status: AC
  Administered 2017-12-31 (×2): via INTRAVENOUS

## 2017-12-31 MED ORDER — ASPIRIN 81 MG PO CHEW: 81.0000 mg | CHEWABLE_TABLET | ORAL | Status: DC

## 2017-12-31 MED ORDER — IOHEXOL 350 MG/ML SOLN
INTRAVENOUS | Status: DC | PRN
  Administered 2017-12-31: 25 mL via INTRA_ARTERIAL

## 2017-12-31 MED ORDER — FENTANYL CITRATE (PF) 100 MCG/2ML IJ SOLN
INTRAMUSCULAR | Status: DC | PRN
  Administered 2017-12-31: 25 ug via INTRAVENOUS

## 2017-12-31 MED ORDER — AMLODIPINE BESYLATE 10 MG PO TABS
10.0000 mg | ORAL_TABLET | Freq: Every day | ORAL | Status: DC
  Administered 2018-01-01: 10 mg via ORAL
  Filled 2017-12-31: qty 1

## 2017-12-31 MED ORDER — MIDAZOLAM HCL 2 MG/2ML IJ SOLN
INTRAMUSCULAR | Status: AC
  Filled 2017-12-31: qty 2

## 2017-12-31 MED ORDER — BIVALIRUDIN BOLUS VIA INFUSION - CUPID
INTRAVENOUS | Status: DC | PRN
  Administered 2017-12-31: 57.525 mg via INTRAVENOUS

## 2017-12-31 MED ORDER — LABETALOL HCL 5 MG/ML IV SOLN
10.0000 mg | INTRAVENOUS | Status: AC | PRN
  Administered 2017-12-31: 10 mg via INTRAVENOUS
  Filled 2017-12-31: qty 4

## 2017-12-31 SURGICAL SUPPLY — 14 items
BALLN SAPPHIRE 2.0X12 (BALLOONS) ×2
BALLN SAPPHIRE ~~LOC~~ 3.25X12 (BALLOONS) ×2 IMPLANT
BALLOON SAPPHIRE 2.0X12 (BALLOONS) ×1 IMPLANT
CATH INFINITI 5FR JL4 (CATHETERS) ×2 IMPLANT
CATH LAUNCHER 6FR JR4 (CATHETERS) ×2 IMPLANT
KIT ENCORE 26 ADVANTAGE (KITS) ×2 IMPLANT
KIT HEART LEFT (KITS) ×2 IMPLANT
PACK CARDIAC CATHETERIZATION (CUSTOM PROCEDURE TRAY) ×2 IMPLANT
SHEATH PINNACLE 6F 10CM (SHEATH) ×2 IMPLANT
STENT SYNERGY DES 3X16 (Permanent Stent) ×2 IMPLANT
TRANSDUCER W/STOPCOCK (MISCELLANEOUS) ×2 IMPLANT
TUBING CIL FLEX 10 FLL-RA (TUBING) ×2 IMPLANT
WIRE ASAHI PROWATER 180CM (WIRE) ×4 IMPLANT
WIRE EMERALD 3MM-J .035X150CM (WIRE) ×2 IMPLANT

## 2017-12-31 NOTE — Progress Notes (Signed)
ACT down to 169 around 1920. Sheath has been oozing some and it was pulled at approximately 2020. Pressure was held for about 1 hour. BP became elevated as patient became more uncomfortable. Hydralazine prn was given and then labetalol prn given. A pressure dressing was applied after hemostasis and patient was instructed on post sheath removal care with teach back. Within 30 minutes after pulling sheath, a slight ooze was noted. Will continue to assess frequently.

## 2017-12-31 NOTE — Interval H&P Note (Signed)
Cath Lab Visit (complete for each Cath Lab visit)  Clinical Evaluation Leading to the Procedure:   ACS: Yes.    Non-ACS:    Anginal Classification: No Symptoms  Anti-ischemic medical therapy: Minimal Therapy (1 class of medications)  Non-Invasive Test Results: No non-invasive testing performed  Prior CABG: No previous CABG      History and Physical Interval Note:  08/03/7739 2:87 PM  Lindsey York  has presented today for surgery, with the diagnosis of CAD  The various methods of treatment have been discussed with the patient and family. After consideration of risks, benefits and other options for treatment, the patient has consented to  Procedure(s): CORONARY STENT INTERVENTION (N/A) as a surgical intervention .  The patient's history has been reviewed, patient examined, no change in status, stable for surgery.  I have reviewed the patient's chart and labs.  Questions were answered to the patient's satisfaction.     Quay Burow

## 2018-01-01 ENCOUNTER — Encounter (HOSPITAL_COMMUNITY): Payer: Self-pay | Admitting: Cardiovascular Disease

## 2018-01-01 DIAGNOSIS — N184 Chronic kidney disease, stage 4 (severe): Secondary | ICD-10-CM | POA: Diagnosis not present

## 2018-01-01 DIAGNOSIS — E663 Overweight: Secondary | ICD-10-CM | POA: Diagnosis not present

## 2018-01-01 DIAGNOSIS — I2510 Atherosclerotic heart disease of native coronary artery without angina pectoris: Secondary | ICD-10-CM | POA: Diagnosis not present

## 2018-01-01 DIAGNOSIS — I129 Hypertensive chronic kidney disease with stage 1 through stage 4 chronic kidney disease, or unspecified chronic kidney disease: Secondary | ICD-10-CM | POA: Diagnosis not present

## 2018-01-01 DIAGNOSIS — I25118 Atherosclerotic heart disease of native coronary artery with other forms of angina pectoris: Secondary | ICD-10-CM | POA: Diagnosis not present

## 2018-01-01 LAB — BASIC METABOLIC PANEL
Anion gap: 6 (ref 5–15)
BUN: 63 mg/dL — AB (ref 6–20)
CO2: 19 mmol/L — ABNORMAL LOW (ref 22–32)
Calcium: 7.7 mg/dL — ABNORMAL LOW (ref 8.9–10.3)
Chloride: 116 mmol/L — ABNORMAL HIGH (ref 98–111)
Creatinine, Ser: 4.43 mg/dL — ABNORMAL HIGH (ref 0.44–1.00)
GFR calc Af Amer: 12 mL/min — ABNORMAL LOW (ref >60)
GFR calc non Af Amer: 10 mL/min — ABNORMAL LOW (ref >60)
GLUCOSE: 101 mg/dL — AB (ref 70–99)
POTASSIUM: 4.1 mmol/L (ref 3.5–5.1)
Sodium: 141 mmol/L (ref 135–145)

## 2018-01-01 LAB — CBC
HEMATOCRIT: 34 % — AB (ref 36.0–46.0)
HEMOGLOBIN: 10.6 g/dL — AB (ref 12.0–15.0)
MCH: 30.3 pg (ref 26.0–34.0)
MCHC: 31.2 g/dL (ref 30.0–36.0)
MCV: 97.1 fL (ref 78.0–100.0)
Platelets: 185 10*3/uL (ref 150–400)
RBC: 3.5 MIL/uL — ABNORMAL LOW (ref 3.87–5.11)
RDW: 16.4 % — AB (ref 11.5–15.5)
WBC: 11.5 10*3/uL — AB (ref 4.0–10.5)

## 2018-01-01 MED ORDER — SEVELAMER CARBONATE 800 MG PO TABS
800.0000 mg | ORAL_TABLET | Freq: Three times a day (TID) | ORAL | Status: DC
  Administered 2018-01-01 (×2): 800 mg via ORAL
  Filled 2018-01-01 (×2): qty 1

## 2018-01-01 MED ORDER — ANGIOPLASTY BOOK
Freq: Once | Status: AC
  Administered 2018-01-01: 1
  Filled 2018-01-01: qty 1

## 2018-01-01 MED ORDER — "THROMBI-PAD 3""X3"" EX PADS"
1.0000 | MEDICATED_PAD | Freq: Once | CUTANEOUS | Status: AC
  Administered 2018-01-01: 1 via TOPICAL
  Filled 2018-01-01: qty 1

## 2018-01-01 NOTE — Care Management (Signed)
#   7.   S/W [facility]  @ [facility] # [id number]  TICAGRELOR : NONE FORMULARY  BRILINTA  90 MG BID COVER- YES CO-PAY- $ 47.00 TIER- 3 DRUG PRIOR APPROVAL- NO  PREFERRED PHARMACY : YES    [facility] NO M/O FOR 90 DAY SUPPLY

## 2018-01-01 NOTE — Discharge Summary (Addendum)
Discharge Summary    Patient ID: Lindsey Lindsey York,  MRN: [id number], DOB/AGE: 10/24/1965 52 y.o.  Admit date: 12/31/2017 Discharge date: 01/01/2018  Primary Care Provider: System, Pcp Not In Primary Cardiologist: Dr. Gwenlyn Lindsey York   Discharge Diagnoses    Active Problems:   NSTEMI (non-ST elevated myocardial infarction) [facility])   CAD (coronary artery disease)   Allergies Allergies  Allergen Reactions  . Hydroxychloroquine Hives and Rash  . Codeine     Diagnostic Studies/Procedures    Cath: 12/31/17   Mid RCA lesion is 80% stenosed.  Previously placed Ost 2nd Mrg to 2nd Mrg stent (unknown type) is widely patent.  A drug-eluting stent was successfully placed.  Post intervention, there is a 0% residual stenosis.   IMPRESSION: Lindsey Lindsey York is a widely patent circumflex obtuse marginal branch stent recently placed by Dr. Angelena York in the setting of "non-STEMI".  She had successful mid dominant RCA PCI and drug-eluting stenting using a synergy drug-eluting stent postdilated to 3.3 mm.  Total contrast administered to the patient was 25 cc.  Angiomax was discontinued at the end of the case.  The patient was already on dual antiplatelet therapy.  Sheath will be removed in several hours and pressure held.  Patient will be gently hydrated.  Dr. Marval Lindsey York, the patient's nephrologist, is aware of the procedure.  She will be discharged home in the morning and follow-up with me in 1 to 2 weeks.  She left the lab in stable condition.  Lindsey Lindsey York. MD, [facility] _____________   History of Present Illness     52 Lindsey York with PMH of CKD stage 4, HTN, HL who was seen in the office for a post hospital follow-up after a recent non-STEMI, PCI and stenting of an obtuse marginal branch by Dr. Angelena York. She has a history of systemic lupus erythematosus, CKD 4 with serum creatinine in the 5-6 range predialysis previously on the transplant list at [facility]. She also has history  of treated hypertension hyperlipidemia. She currently does not work. She has never smoked. She had a non-STEMI 08/26/2017 and catheterization performed by Dr. Angelena York via the right femoral approach revealing a subtotally occluded first marginal branch which was stented with a synergy drug-eluting stent (2.25 mm x 20 mill meters). She did have an 80% fairly focal mid dominant RCA stenosis which was not intervened on because of her renal insufficiency. She was otherwise asymptomatic but wished to continue on the renal transplant list which cannot occur until her RCA is intervened on. Given this she was set up for outpatient cath.   Hospital Course     Underwent cath noted above with successful PCI/DES to the [facility] with 0% residual stenosis. Plan to continue with DAPT. No chest pain overnight. She was hydrated post cath. Morning labs were stable. Did have some femoral bleeding after cath but site was able to be stabilized with manuel pressure. She was able to ambulate the following morning without any complications. Continued on her home medications without significant change.   Lindsey Lindsey York was seen by Dr. Irish York and determined stable for discharge home. Follow up in the office has been arranged. Medications are listed below.   _____________  Discharge Vitals Blood pressure 138/88, pulse 73, temperature 98.4 F (36.9 C), resp. rate 13, height 5\' 3"  (1.6 m), weight 74.8 kg, SpO2 100 %.  Filed Weights   12/31/17 0943 01/01/18 0500  Weight: 76.7 kg 74.8 kg    Labs & Radiologic Studies  CBC Recent Labs    01/01/18 0345  WBC 11.5*  HGB 10.6*  HCT Lindsey.0*  MCV 97.1  PLT 568   Basic Metabolic Panel Recent Labs    01/01/18 0345  NA 141  K 4.1  CL 116*  CO2 19*  GLUCOSE 101*  BUN 63*  CREATININE 4.43*  CALCIUM 7.7*   Liver Function Tests No results for input(s): AST, ALT, ALKPHOS, BILITOT, PROT, ALBUMIN in the last 72 hours. No results for input(s): LIPASE, AMYLASE in the  last 72 hours. Cardiac Enzymes No results for input(s): CKTOTAL, CKMB, CKMBINDEX, TROPONINI in the last 72 hours. BNP Invalid input(s): POCBNP D-Dimer No results for input(s): DDIMER in the last 72 hours. Hemoglobin A1C No results for input(s): HGBA1C in the last 72 hours. Fasting Lipid Panel No results for input(s): CHOL, HDL, LDLCALC, TRIG, CHOLHDL, LDLDIRECT in the last 72 hours. Thyroid Function Tests No results for input(s): TSH, T4TOTAL, T3FREE, THYROIDAB in the last 72 hours.  Invalid input(s): FREET3 _____________  No results Lindsey York. Disposition   Pt is being discharged home today in good condition.  Follow-up Plans & Appointments    Follow-up Information    Lindsey Harp, MD Follow up on 01/11/2018.   Specialties:  Cardiology, Radiology Why:  at 11am for your follow up appt.  Contact information: [address] [place] [address] [phone]          Discharge Instructions    Amb Referral to Cardiac Rehabilitation   Complete by:  As directed    Will send referral for Cardiac Rehab Phase 2 to [place]   Diagnosis:  Coronary Stents   Call MD for:  redness, tenderness, or signs of infection (pain, swelling, redness, odor or green/yellow discharge around incision site)   Complete by:  As directed    Diet - low sodium heart healthy   Complete by:  As directed    Discharge instructions   Complete by:  As directed    Groin Site Care Refer to this sheet in the next few weeks. These instructions provide you with information on caring for yourself after your procedure. Your caregiver may also give you more specific instructions. Your treatment has been planned according to current medical practices, but problems sometimes occur. Call your caregiver if you have any problems or questions after your procedure. HOME CARE INSTRUCTIONS You may shower 24 hours after the procedure. Remove the bandage (dressing) and gently wash the site with plain soap  and water. Gently pat the site dry.  Do not apply powder or lotion to the site.  Do not sit in a bathtub, swimming pool, or whirlpool for 5 to 7 days.  No bending, squatting, or lifting anything over 10 pounds (4.5 kg) as directed by your caregiver.  Inspect the site at least twice daily.  Do not drive home if you are discharged the same day of the procedure. Have someone else drive you.  You may drive 48 hours after the procedure unless otherwise instructed by your caregiver.  What to expect: Any bruising will usually fade within 1 to 2 weeks.  Blood that collects in the tissue (hematoma) may be painful to the touch. It should usually decrease in size and tenderness within 1 to 2 weeks.  SEEK IMMEDIATE MEDICAL CARE IF: You have unusual pain at the groin site or down the affected leg.  You have redness, warmth, swelling, or pain at the groin site.  You have drainage (other than a small  amount of blood on the dressing).  You have chills.  You have a fever or persistent symptoms for more than 72 hours.  You have a fever and your symptoms suddenly get worse.  Your leg becomes pale, cool, tingly, or numb.  You have heavy bleeding from the site. Hold pressure on the site. Marland Kitchen  PLEASE DO NOT MISS ANY DOSES OF YOUR BRILINTA!!!!! Also keep a log of you blood pressures and bring back to your follow up appt. Please call the office with any questions.   Patients taking blood thinners should generally stay away from medicines like ibuprofen, Advil, Motrin, naproxen, and Aleve due to risk of stomach bleeding. You may take Tylenol as directed or talk to your primary doctor about alternatives.   Increase activity slowly   Complete by:  As directed       Discharge Medications     Medication List    STOP taking these medications   guaiFENesin-dextromethorphan 100-10 MG/5ML syrup Commonly known as:  ROBITUSSIN DM     TAKE these medications   acetaminophen 325 MG tablet Commonly known as:   TYLENOL Take 650 mg by mouth every 6 (six) hours as needed for moderate pain.   allopurinol 100 MG tablet Commonly known as:  ZYLOPRIM Take 100 mg by mouth daily.   amLODipine 10 MG tablet Commonly known as:  NORVASC Take 10 mg by mouth daily.   aspirin 81 MG chewable tablet Chew 1 tablet (81 mg total) by mouth daily.   atorvastatin 40 MG tablet Commonly known as:  LIPITOR Take 1 tablet (40 mg total) by mouth daily.   AVASTIN 400 MG/16ML Soln Generic drug:  bevacizumab Inject 1.25 mg as directed every 30 (thirty) days.   calcitRIOL 0.25 MCG capsule Commonly known as:  ROCALTROL Take 0.25 mcg by mouth 3 (three) times a week. Monday, Wednesday, Friday   carvedilol 6.25 MG tablet Commonly known as:  COREG TAKE 1 TABLET BY MOUTH TWICE A DAY WITH MEALS   cloNIDine 0.1 MG tablet Commonly known as:  CATAPRES Take 1 tab at 8am, 4pm and midnight   furosemide 80 MG tablet Commonly known as:  LASIX Take 1 tablet (80 mg total) by mouth every other day. Take 1 tablet daily for 3 days and then go back to taking 1 tablet every other day. What changed:  additional instructions   hydrALAZINE 25 MG tablet Commonly known as:  APRESOLINE Take 1 tablet (25 mg total) by mouth 3 (three) times daily. Take 1 tab at 8am, 4pm, and midnight   lactulose 10 GM/15ML solution Commonly known as:  CHRONULAC Take 30 mLs by mouth 3 (three) times daily as needed for constipation.   nitroGLYCERIN 0.4 MG SL tablet Commonly known as:  NITROSTAT Place 1 tablet (0.4 mg total) under the tongue every 5 (five) minutes as needed for chest pain.   oxybutynin 5 MG tablet Commonly known as:  DITROPAN Take 5 mg by mouth daily as needed for bladder spasms.   predniSONE 1 MG tablet Commonly known as:  DELTASONE Take 1 mg by mouth daily with breakfast.   predniSONE 5 MG tablet Commonly known as:  DELTASONE Take 5 mg by mouth daily with breakfast.   ranitidine 150 MG tablet Commonly known as:  ZANTAC Take  150 mg by mouth every morning.   sevelamer carbonate 800 MG tablet Commonly known as:  RENVELA Take 800 mg by mouth 3 (three) times daily with meals.   sodium bicarbonate 650 MG tablet Take  1,300 mg by mouth 2 (two) times daily.   ticagrelor 90 MG Tabs tablet Commonly known as:  BRILINTA Take 1 tablet (90 mg total) by mouth 2 (two) times daily.       Acute coronary syndrome (MI, NSTEMI, STEMI, etc) this admission?: No.   Outstanding Labs/Studies   N/a   Duration of Discharge Encounter   Greater than 30 minutes including physician time.  Signed, Reino Bellis NP-C 01/01/2018, 1:09 PM   I have examined the patient and reviewed assessment and plan and discussed with patient.  Agree with above as stated.  Please see my note from earlier today.  Renal function and groin site stable.  She did have a hematoma, but has ambulated without difficulty.  Continue DAPT.    Larae Grooms

## 2018-01-01 NOTE — Progress Notes (Signed)
Progress Note  Patient Name: Lindsey York Date of Encounter: 01/01/2018  Primary Cardiologist: Quay Burow, MD   Subjective   No chest pain.  Had some groin bleeding last night.  She required additional pressure on her groin last night.  There is still some oozing early this morning and the nurse placed a thrombin pad.  No bleeding since that time.  She does note that her urine is a little bit red, but she is using a pure wick catheter.  Inpatient Medications    Scheduled Meds: . allopurinol  100 mg Oral Daily  . amLODipine  10 mg Oral Daily  . aspirin  81 mg Oral Daily  . atorvastatin  40 mg Oral Daily  . carvedilol  6.25 mg Oral BID WC  . cloNIDine  0.1 mg Oral TID  . famotidine  20 mg Oral Daily  . furosemide  80 mg Oral QODAY  . hydrALAZINE  25 mg Oral TID  . predniSONE  1 mg Oral Q breakfast  . predniSONE  5 mg Oral Q breakfast  . sevelamer carbonate  800 mg Oral TID WC  . sodium bicarbonate  1,300 mg Oral BID  . sodium chloride flush  3 mL Intravenous Q12H  . ticagrelor  90 mg Oral BID   Continuous Infusions: . sodium chloride     PRN Meds: sodium chloride, acetaminophen, morphine injection, nitroGLYCERIN, ondansetron (ZOFRAN) IV, sodium chloride flush   Vital Signs    Vitals:   01/01/18 0400 01/01/18 0500 01/01/18 0800 01/01/18 1006  BP: (!) 160/91  (!) 161/94 (!) 161/97  Pulse: 73 74 81   Resp: 16 12 13 11   Temp: 98 F (36.7 C)  98.1 F (36.7 C)   TempSrc: Oral  Oral   SpO2: 97% 97% 98%   Weight:  74.8 kg    Height:        Intake/Output Summary (Last 24 hours) at 01/01/2018 1019 Last data filed at 01/01/2018 1008 Gross per 24 hour  Intake 1271.3 ml  Output 1000 ml  Net 271.3 ml   Filed Weights   12/31/17 0943 01/01/18 0500  Weight: 76.7 kg 74.8 kg    Telemetry    Normal sinus rhythm- Personally Reviewed  ECG    Normal sinus rhythm, nonspecific ST T wave changes diffusely- Personally Reviewed  Physical Exam   GEN: No acute  distress.   Neck: No JVD Cardiac: RRR, no murmurs, rubs, or gallops.  Respiratory: Clear to auscultation bilaterally. GI: Soft, nontender, non-distended  MS: No edema; No deformity.  2+ right dorsalis pedis pulse; mild tenderness at the right femoral cath site, no palpable hematoma-pressure dressing in place Neuro:  Nonfocal  Psych: Normal affect   Labs    Chemistry Recent Labs  Lab 12/26/17 1050 01/01/18 0345  NA 142 141  K 4.2 4.1  CL 105 116*  CO2 18* 19*  GLUCOSE 101* 101*  BUN 59* 63*  CREATININE 4.74* 4.43*  CALCIUM 8.5* 7.7*  GFRNONAA 10* 10*  GFRAA 11* 12*  ANIONGAP  --  6     Hematology Recent Labs  Lab 12/26/17 1050 01/01/18 0345  WBC 9.5 11.5*  RBC 3.87 3.50*  HGB 11.8 10.6*  HCT 35.6 34.0*  MCV 92 97.1  MCH 30.5 30.3  MCHC 33.1 31.2  RDW 17.2* 16.4*  PLT 208 185    Cardiac EnzymesNo results for input(s): TROPONINI in the last 168 hours. No results for input(s): TROPIPOC in the last 168 hours.   BNPNo  results for input(s): BNP, PROBNP in the last 168 hours.   DDimer No results for input(s): DDIMER in the last 168 hours.   Radiology    No results found.  Cardiac Studies   Catheter results reviewed  Patient Profile     52 y.o. female coronary artery disease, chronic renal insufficiency who is trying for renal transplant  Assessment & Plan    1) continue dual antiplatelet therapy along with aggressive secondary prevention.  2) CKD stage IV: Creatinine stable.  GFR 12 this morning.  Minimal diet was used.  3) hematoma: We will see how she does later today when she gets up and walks.  If no bleeding issues later in the day, could consider discharge.  I think some of the blood noted in her urine also could have been from femoral bleeding getting into the pure wick suction catheter.  We will see if her urine is clear when she is able to get up and use the commode.  Possible discharge later today.  For questions or updates, please contact  [place] Please consult [email or web address] for contact info under        Signed, Larae Grooms, MD  01/01/2018, 10:19 AM

## 2018-01-01 NOTE — Progress Notes (Signed)
Pt given discharge instructions with understanding. Pt has no questions at this time. IV and monitor d/c. Pt calling husband to pick up.

## 2018-01-01 NOTE — Progress Notes (Signed)
CARDIAC REHAB PHASE I   PRE:  Rate/Rhythm: 81 SR  BP:  Sitting: 148/108      SaO2: 98 RA  MODE:  Ambulation: 250 ft 94 peak HR  POST:  Rate/Rhythm: 88 SR  BP:  Sitting: 166/104    SaO2: 99 RA   Pt ambulated 235ft in hallway, handheld assist with slow steady pace. Pt c/o groin soreness, but denies CP or SOB. Pt and husband educated on importance of ASA, Brilinta, statin and NTG. Stent card at bedside. Encouraged pt to use her renal diet. Reviewed restrictions and exercise guidelines. Will refer to [facility] [place] as pt attended there previously.   [id number] Rufina Falco, RN BSN 01/01/2018 9:46 AM

## 2018-01-01 NOTE — Care Management Note (Signed)
Case Management Note  Patient Details  Name: Lindsey York MRN: [id number] Date of Birth: 1966-03-11  Subjective/Objective:   Pt admitted on 12/31/17 for coronary stenting.  PTA, pt independent of ADLS.                 Action/Plan: Pt discharging home today; on Brilinta prior to admission.  She has no problem affording $47 copay.  Expected Discharge Date:  01/01/18               Expected Discharge Plan:  Home/Self Care  In-House Referral:     Discharge planning Services  CM Consult  Post Acute Care Choice:    Choice offered to:     DME Arranged:    DME Agency:     HH Arranged:    HH Agency:     Status of Service:  Completed, signed off  If discussed at [employer] of Stay Meetings, dates discussed:    Additional Comments:  Ella Bodo, RN 01/01/2018, 2:15 PM

## 2018-01-10 ENCOUNTER — Ambulatory Visit: Payer: Medicare HMO | Admitting: Family Medicine

## 2018-01-11 ENCOUNTER — Ambulatory Visit (INDEPENDENT_AMBULATORY_CARE_PROVIDER_SITE_OTHER): Payer: Medicare HMO | Admitting: Cardiovascular Disease

## 2018-01-11 ENCOUNTER — Encounter: Payer: Self-pay | Admitting: Cardiovascular Disease

## 2018-01-11 DIAGNOSIS — I10 Essential (primary) hypertension: Secondary | ICD-10-CM

## 2018-01-11 DIAGNOSIS — I2510 Atherosclerotic heart disease of native coronary artery without angina pectoris: Secondary | ICD-10-CM | POA: Diagnosis not present

## 2018-01-11 MED ORDER — FAMOTIDINE 20 MG PO TABS: 20.0000 mg | ORAL_TABLET | Freq: Every day | ORAL | 3 refills | Status: DC

## 2018-01-11 NOTE — Addendum Note (Signed)
Addended by: Newt Minion on: 01/11/2018 12:16 PM   Modules accepted: Orders

## 2018-01-11 NOTE — Patient Instructions (Signed)
Medication Instructions:  Your physician has recommended you make the following change in your medication:  1) START Pepcid 20 mg tablet by mouth ONCE daily  If you need a refill on your cardiac medications before your next appointment, please call your pharmacy.   Lab work: NONE If you have labs (blood work) drawn today and your tests are completely normal, you will receive your results only by: Marland Kitchen MyChart Message (if you have MyChart) OR . A paper copy in the mail If you have any lab test that is abnormal or we need to change your treatment, we will call you to review the results.  Testing/Procedures: NONE  Follow-Up: At [facility], you and your health needs are our priority.  As part of our continuing mission to provide you with exceptional heart care, we have created designated Provider Care Teams.  These Care Teams include your primary Cardiologist (physician) and Advanced Practice Providers (APPs -  Physician Assistants and Nurse Practitioners) who all work together to provide you with the care you need, when you need it. You will need a follow up appointment in 6 months with Rosaria Ferries, PA and Dr. Gwenlyn Found in 12 months.  Please call our office 2 months in advance to schedule this appointment.  You may see Quay Burow, MD or one of the following Advanced Practice Providers on your designated Care Team:   Kerin Ransom, PA-C Roby Lofts, [place] . Sande Rives, PA-C  Any Other Special Instructions Will Be Listed Below (If Applicable).

## 2018-01-11 NOTE — Assessment & Plan Note (Signed)
She of essential hypertension blood pressure measured today 130/88.  She is on amlodipine, carvedilol, hydralazine and Catapres.

## 2018-01-11 NOTE — Progress Notes (Signed)
78/67/6720 [name]   [birth year]  [id number]  Primary Physician System, Pcp Not In Primary Cardiologist: Lorretta Harp MD FACP, [place], [place], [place]  HPI:  Lindsey York is a 52 y.o.  moderately overweight divorced African-American female mother of 2 children who I last saw in the office 12/20/2017. She has a history of systemic lupus erythematosus, CKD 4 with serum creatinine is in the 5-6 range predialysis previously on the transplant list at [facility]. She also has history of treated hypertension hyperlipidemia. She currently does not work. She has never smoked. She had a non-STEMI 08/26/2017 and catheterization performed by Dr. Angelena Form via the right femoral approach revealing a subtotally occluded first marginal branch which was stented with a synergy drug-eluting stent (2.25 mm x 20 mill meters). She did have an 80% fairly focal mid dominant RCA stenosis which was not intervened on because of her renal insufficiency. She is otherwise asymptomatic but wishes to continue on the renal transplant list which cannot occur until her RCA is intervened on. I performed RCA PCI and drug-eluting stenting electively as an outpatient 12/31/2017 with a synergy drug-eluting stent via the right femoral approach.  She has done well since and remains on dual antiplatelet therapy.  I did use only 25 cc of contrast and this did not affect her renal function.  Current Meds  Medication Sig  . acetaminophen (TYLENOL) 325 MG tablet Take 650 mg by mouth every 6 (six) hours as needed for moderate pain.  Marland Kitchen allopurinol (ZYLOPRIM) 100 MG tablet Take 100 mg by mouth daily.  Marland Kitchen amLODipine (NORVASC) 10 MG tablet Take 10 mg by mouth daily.  Marland Kitchen aspirin 81 MG chewable tablet Chew 1 tablet (81 mg total) by mouth daily.  Marland Kitchen atorvastatin (LIPITOR) 40 MG tablet Take 1 tablet (40 mg total) by mouth daily.  . bevacizumab (AVASTIN) 400 MG/16ML SOLN Inject 1.25 mg as directed every 30 (thirty)  days.  . calcitRIOL (ROCALTROL) 0.25 MCG capsule Take 0.25 mcg by mouth 3 (three) times a week. Monday, Wednesday, Friday  . carvedilol (COREG) 6.25 MG tablet TAKE 1 TABLET BY MOUTH TWICE A DAY WITH MEALS  . cloNIDine (CATAPRES) 0.1 MG tablet Take 1 tab at 8am, 4pm and midnight  . furosemide (LASIX) 80 MG tablet Take 1 tablet (80 mg total) by mouth every other day. Take 1 tablet daily for 3 days and then go back to taking 1 tablet every other day. (Patient taking differently: Take 80 mg by mouth every other day. )  . hydrALAZINE (APRESOLINE) 25 MG tablet Take 1 tablet (25 mg total) by mouth 3 (three) times daily. Take 1 tab at 8am, 4pm, and midnight  . lactulose (CHRONULAC) 10 GM/15ML solution Take 30 mLs by mouth 3 (three) times daily as needed for constipation.  Marland Kitchen oxybutynin (DITROPAN) 5 MG tablet Take 5 mg by mouth daily as needed for bladder spasms.  . predniSONE (DELTASONE) 1 MG tablet Take 1 mg by mouth daily with breakfast.  . predniSONE (DELTASONE) 5 MG tablet Take 5 mg by mouth daily with breakfast.  . sevelamer carbonate (RENVELA) 800 MG tablet Take 800 mg by mouth 3 (three) times daily with meals.   . sodium bicarbonate 650 MG tablet Take 1,300 mg by mouth 2 (two) times daily.  . ticagrelor (BRILINTA) 90 MG TABS tablet Take 1 tablet (90 mg total) by mouth 2 (two) times daily.     Allergies  Allergen Reactions  . Hydroxychloroquine Hives and Rash  .  Codeine     Social History   Socioeconomic History  . Marital status: Divorced    Spouse name: Not on file  . Number of children: Not on file  . Years of education: Not on file  . Highest education level: Not on file  Occupational History  . Not on file  Social Needs  . Financial resource strain: Not on file  . Food insecurity:    Worry: Not on file    Inability: Not on file  . Transportation needs:    Medical: Not on file    Non-medical: Not on file  Tobacco Use  . Smoking status: Never Smoker  . Smokeless tobacco:  Never Used  Substance and Sexual Activity  . Alcohol use: Never    Frequency: Never  . Drug use: Never  . Sexual activity: Yes  Lifestyle  . Physical activity:    Days per week: Not on file    Minutes per session: Not on file  . Stress: Not on file  Relationships  . Social connections:    Talks on phone: Not on file    Gets together: Not on file    Attends religious service: Not on file    Active member of club or organization: Not on file    Attends meetings of clubs or organizations: Not on file    Relationship status: Not on file  . Intimate partner violence:    Fear of current or ex partner: Not on file    Emotionally abused: Not on file    Physically abused: Not on file    Forced sexual activity: Not on file  Other Topics Concern  . Not on file  Social History Narrative  . Not on file     Review of Systems: General: negative for chills, fever, night sweats or weight changes.  Cardiovascular: negative for chest pain, dyspnea on exertion, edema, orthopnea, palpitations, paroxysmal nocturnal dyspnea or shortness of breath Dermatological: negative for rash Respiratory: negative for cough or wheezing Urologic: negative for hematuria Abdominal: negative for nausea, vomiting, diarrhea, bright red blood per rectum, melena, or hematemesis Neurologic: negative for visual changes, syncope, or dizziness All other systems reviewed and are otherwise negative except as noted above.    Blood pressure 130/88, pulse 68, height 5\' 3"  (1.6 m), weight 171 lb 9.6 oz (77.8 kg), SpO2 99 %.  General appearance: alert and no distress Neck: no adenopathy, no carotid bruit, no JVD, supple, symmetrical, trachea midline and thyroid not enlarged, symmetric, no tenderness/mass/nodules Lungs: clear to auscultation bilaterally Heart: regular rate and rhythm, S1, S2 normal, no murmur, click, rub or gallop Extremities: extremities normal, atraumatic, no cyanosis or edema Pulses: 2+ and  symmetric Skin: Skin color, texture, turgor normal. No rashes or lesions Neurologic: Alert and oriented X 3, normal strength and tone. Normal symmetric reflexes. Normal coordination and gait  EKG not performed today  ASSESSMENT AND PLAN:   Hypertension She of essential hypertension blood pressure measured today 130/88.  She is on amlodipine, carvedilol, hydralazine and Catapres.  CAD (coronary artery disease) History of CAD status post non-STEMI treated with PCI and drug-eluting stenting by Dr. Angelena Form 08/26/2017 of a obtuse marginal branch.  She does have chronic renal insufficiency.  She had an asymptomatic 80% fairly focal mid dominant RCA stenosis and apparently is on the renal transplant list.  This need to be addressed before she could move forward with evaluation.  I performed PCI and drug-eluting stenting via the right femoral approach 12/31/2017 using  a 3 mm x 16 mm long Synergy drug-eluting stent postdilated to 3.25 mm.  She has done well since.      Lorretta Harp MD FACP,FACC,FAHA, [facility] 01/11/2018 12:06 PM

## 2018-01-11 NOTE — Assessment & Plan Note (Signed)
History of CAD status post non-STEMI treated with PCI and drug-eluting stenting by Dr. Angelena Form 08/26/2017 of a obtuse marginal branch.  She does have chronic renal insufficiency.  She had an asymptomatic 80% fairly focal mid dominant RCA stenosis and apparently is on the renal transplant list.  This need to be addressed before she could move forward with evaluation.  I performed PCI and drug-eluting stenting via the right femoral approach 12/31/2017 using a 3 mm x 16 mm long Synergy drug-eluting stent postdilated to 3.25 mm.  She has done well since.

## 2018-01-21 ENCOUNTER — Telehealth: Payer: Self-pay | Admitting: Cardiovascular Disease

## 2018-01-21 MED ORDER — HYDRALAZINE HCL 50 MG PO TABS: 50.0000 mg | ORAL_TABLET | Freq: Three times a day (TID) | ORAL | 3 refills | Status: DC

## 2018-01-21 NOTE — Telephone Encounter (Signed)
New  Message          Patient is calling stating that b/p is constanly running high. (160/99) Patient would like a call back concerning this matter>

## 2018-01-21 NOTE — Telephone Encounter (Signed)
LMOM

## 2018-01-21 NOTE — Telephone Encounter (Signed)
Patient returned call.  She has noted an elevated pressure at home, as well as when at nephrology appointment last week.  Running high both systolic and diastolic.  Patient is on list for renal transplant, so will be careful about which medications we use.   For now, will have her increase hydralazine from 25 to 50 mg tid.  She is aware to watch for signs of hypotension.  She will continue to monitor home BP no more than twice daily and call in 7-10 days to let us know how she is doing, sooner if problems.

## 2018-01-21 NOTE — Telephone Encounter (Signed)
Called patient, she states that she was just seen on 01/11/18, but since that appointment she has had increased BP and it is concerning to her. Her BP today was 160/99 Yesterday- 159/105 HR 77 Saturday- 160/90 HR 75 01/15/18- 160/90 HR 74  She denies any diet changes, no increased stress, no swelling, no SOB, and no CP. She would like to know if any changes in medication needs to be made. The only change that was made at last visit was her Pepcid. She is continuing her current medications as instructed.  Please advise, thank you!

## 2018-01-22 ENCOUNTER — Encounter: Payer: Self-pay | Admitting: Family Medicine

## 2018-01-22 ENCOUNTER — Ambulatory Visit: Payer: Medicare HMO | Admitting: Family Medicine

## 2018-01-22 DIAGNOSIS — Z0289 Encounter for other administrative examinations: Secondary | ICD-10-CM

## 2018-02-05 ENCOUNTER — Other Ambulatory Visit: Payer: Self-pay | Admitting: Cardiovascular Disease

## 2018-02-05 NOTE — Telephone Encounter (Signed)
Rx request sent to pharmacy.  

## 2018-02-22 ENCOUNTER — Telehealth: Payer: Self-pay | Admitting: Cardiovascular Disease

## 2018-02-22 NOTE — Telephone Encounter (Signed)
Error no note needed °

## 2018-02-22 NOTE — Telephone Encounter (Signed)
° ° °  ° °  [facility] Pre-operative Risk Assessment    Request for surgical clearance:  1. What type of surgery is being performed? Creation AV fistula  2. When is this surgery scheduled? TBD  3. What type of clearance is required (medical clearance vs. Pharmacy clearance to hold med vs. Both)? BOTH  4. Are there any medications that need to be held prior to surgery and how long? BRILINTA  5. Practice name and name of physician performing surgery? [facility]- Dr Tamala Fothergill  6. What is your office phone number [phone]   7.   What is your office fax number [phone] Attn: Vanetta Mulders PA  8.   Anesthesia type (None, local, MAC, general) ? Protection 02/22/2018, 4:28 PM  _________________________________________________________________   (provider comments below)

## 2018-02-25 NOTE — Telephone Encounter (Signed)
Patient recently had PCI to mid RCA by Dr. Gwenlyn Found in Sept 2019. High risk of in-stent restenosis if come off of Brilinta too early. Dr. Gwenlyn Found, please review and comment on the earliest time patient may temporarily hold Brilinta for 5 days prior to procedure.

## 2018-02-27 NOTE — Telephone Encounter (Signed)
52 yo female with hx of CAD s/p NSTEMI in 5/19 tx with DES to OM1 and staged PCI on 12/31/17 with DES to RCA, systemic lupus erythematosus, CKD stage 4, HTN, HLP.    As noted, Dr. Gwenlyn Found feels the Kary Kos can be held after Jan 1.  RCRI:  6.6%  LM for patient to call back so that we can assess for any clinical change since her last visit. Richardson Dopp, PA-C    02/27/2018 2:50 PM

## 2018-02-27 NOTE — Telephone Encounter (Signed)
Okay to interrupt the Brilinta after January 1 in order to create her AV fistula.  Please restart after afterwards.

## 2018-03-04 NOTE — Telephone Encounter (Signed)
Follow up   Patient is returning call. Please call asap.

## 2018-03-04 NOTE — Telephone Encounter (Signed)
12-2 pt returning pre-op call from 11-27-pls call [phone]

## 2018-03-04 NOTE — Telephone Encounter (Signed)
12-2 244pm pt returning call again--pls call [phone]

## 2018-03-05 ENCOUNTER — Other Ambulatory Visit: Payer: Self-pay | Admitting: *Deleted

## 2018-03-05 DIAGNOSIS — E785 Hyperlipidemia, unspecified: Secondary | ICD-10-CM

## 2018-03-05 NOTE — Telephone Encounter (Signed)
   Primary Cardiologist: Quay Burow, MD  Chart reviewed as part of pre-operative protocol coverage. H/o CAD s/p PCI (08/2017 and 12/2017), CKD IV-V, SLE, HTN, HLD. RCRI deemed 6.6%. She denies any interim changes in her cardiac status. No new angina/dyspnea or syncope. Given past medical history and time since last visit, based on ACC/AHA guidelines, Prisha Hiley would be at acceptable risk for the planned procedure without further cardiovascular testing.   From an antiplatelet standpoint, she had PCI as recently as 12/31/17. She is at risk for in-stent restenosis if she comes off Brilinta too early. Dr. Gwenlyn Found responded to this request and stated, "Okay to interrupt the Brilinta after January 1 in order to create her AV fistula.  Please restart after afterwards."  I will route this recommendation to the requesting party via Industry fax function and remove from pre-op pool.  Please call with questions.  Charlie Pitter, PA-C 03/05/2018, 1:47 PM

## 2018-03-19 ENCOUNTER — Telehealth: Payer: Self-pay | Admitting: Cardiovascular Disease

## 2018-03-19 NOTE — Telephone Encounter (Signed)
New message    Patient is following up on her blood work

## 2018-03-19 NOTE — Telephone Encounter (Signed)
Attempted to contact pt. Unable to leave message as phone continues to ring.

## 2018-03-20 NOTE — Telephone Encounter (Signed)
Follow up    Patient is calling back to see if you received lab work from [facility]?

## 2018-03-20 NOTE — Telephone Encounter (Signed)
Patient informed that lab results received and have been routed to her physician for review.

## 2018-04-09 DIAGNOSIS — N189 Chronic kidney disease, unspecified: Secondary | ICD-10-CM | POA: Diagnosis not present

## 2018-04-12 DIAGNOSIS — D631 Anemia in chronic kidney disease: Secondary | ICD-10-CM | POA: Diagnosis not present

## 2018-04-12 DIAGNOSIS — K5900 Constipation, unspecified: Secondary | ICD-10-CM | POA: Diagnosis not present

## 2018-04-12 DIAGNOSIS — Z7982 Long term (current) use of aspirin: Secondary | ICD-10-CM | POA: Diagnosis not present

## 2018-04-12 DIAGNOSIS — K219 Gastro-esophageal reflux disease without esophagitis: Secondary | ICD-10-CM | POA: Diagnosis not present

## 2018-04-12 DIAGNOSIS — I9751 Accidental puncture and laceration of a circulatory system organ or structure during a circulatory system procedure: Secondary | ICD-10-CM | POA: Diagnosis not present

## 2018-04-12 DIAGNOSIS — Z955 Presence of coronary angioplasty implant and graft: Secondary | ICD-10-CM | POA: Diagnosis not present

## 2018-04-12 DIAGNOSIS — G8929 Other chronic pain: Secondary | ICD-10-CM | POA: Diagnosis not present

## 2018-04-12 DIAGNOSIS — N184 Chronic kidney disease, stage 4 (severe): Secondary | ICD-10-CM | POA: Diagnosis not present

## 2018-04-12 DIAGNOSIS — I2510 Atherosclerotic heart disease of native coronary artery without angina pectoris: Secondary | ICD-10-CM | POA: Diagnosis not present

## 2018-04-12 DIAGNOSIS — M109 Gout, unspecified: Secondary | ICD-10-CM | POA: Diagnosis not present

## 2018-04-12 DIAGNOSIS — E785 Hyperlipidemia, unspecified: Secondary | ICD-10-CM | POA: Diagnosis not present

## 2018-04-12 DIAGNOSIS — N185 Chronic kidney disease, stage 5: Secondary | ICD-10-CM | POA: Diagnosis not present

## 2018-04-12 DIAGNOSIS — M3214 Glomerular disease in systemic lupus erythematosus: Secondary | ICD-10-CM | POA: Diagnosis not present

## 2018-04-12 DIAGNOSIS — Z79899 Other long term (current) drug therapy: Secondary | ICD-10-CM | POA: Diagnosis not present

## 2018-04-12 DIAGNOSIS — R9431 Abnormal electrocardiogram [ECG] [EKG]: Secondary | ICD-10-CM | POA: Diagnosis not present

## 2018-04-12 DIAGNOSIS — I120 Hypertensive chronic kidney disease with stage 5 chronic kidney disease or end stage renal disease: Secondary | ICD-10-CM | POA: Diagnosis not present

## 2018-04-12 DIAGNOSIS — I129 Hypertensive chronic kidney disease with stage 1 through stage 4 chronic kidney disease, or unspecified chronic kidney disease: Secondary | ICD-10-CM | POA: Diagnosis not present

## 2018-04-15 DIAGNOSIS — M3214 Glomerular disease in systemic lupus erythematosus: Secondary | ICD-10-CM | POA: Diagnosis not present

## 2018-04-15 DIAGNOSIS — N184 Chronic kidney disease, stage 4 (severe): Secondary | ICD-10-CM | POA: Diagnosis not present

## 2018-04-15 DIAGNOSIS — D631 Anemia in chronic kidney disease: Secondary | ICD-10-CM | POA: Diagnosis not present

## 2018-04-16 DIAGNOSIS — K219 Gastro-esophageal reflux disease without esophagitis: Secondary | ICD-10-CM | POA: Diagnosis not present

## 2018-04-16 DIAGNOSIS — Z955 Presence of coronary angioplasty implant and graft: Secondary | ICD-10-CM | POA: Diagnosis not present

## 2018-04-16 DIAGNOSIS — G8929 Other chronic pain: Secondary | ICD-10-CM | POA: Diagnosis not present

## 2018-04-16 DIAGNOSIS — Z79899 Other long term (current) drug therapy: Secondary | ICD-10-CM | POA: Diagnosis not present

## 2018-04-16 DIAGNOSIS — N185 Chronic kidney disease, stage 5: Secondary | ICD-10-CM | POA: Diagnosis not present

## 2018-04-16 DIAGNOSIS — Z7982 Long term (current) use of aspirin: Secondary | ICD-10-CM | POA: Diagnosis not present

## 2018-04-16 DIAGNOSIS — K5900 Constipation, unspecified: Secondary | ICD-10-CM | POA: Diagnosis not present

## 2018-04-16 DIAGNOSIS — I9751 Accidental puncture and laceration of a circulatory system organ or structure during a circulatory system procedure: Secondary | ICD-10-CM | POA: Diagnosis not present

## 2018-04-16 DIAGNOSIS — E785 Hyperlipidemia, unspecified: Secondary | ICD-10-CM | POA: Diagnosis not present

## 2018-04-16 DIAGNOSIS — M3214 Glomerular disease in systemic lupus erythematosus: Secondary | ICD-10-CM | POA: Diagnosis not present

## 2018-04-16 DIAGNOSIS — I2510 Atherosclerotic heart disease of native coronary artery without angina pectoris: Secondary | ICD-10-CM | POA: Diagnosis not present

## 2018-04-16 DIAGNOSIS — I120 Hypertensive chronic kidney disease with stage 5 chronic kidney disease or end stage renal disease: Secondary | ICD-10-CM | POA: Diagnosis not present

## 2018-04-16 DIAGNOSIS — N184 Chronic kidney disease, stage 4 (severe): Secondary | ICD-10-CM | POA: Diagnosis not present

## 2018-04-16 DIAGNOSIS — D631 Anemia in chronic kidney disease: Secondary | ICD-10-CM | POA: Diagnosis not present

## 2018-04-16 DIAGNOSIS — M109 Gout, unspecified: Secondary | ICD-10-CM | POA: Diagnosis not present

## 2018-04-18 ENCOUNTER — Telehealth: Payer: Self-pay | Admitting: Cardiovascular Disease

## 2018-04-18 NOTE — Telephone Encounter (Signed)
Spoke with pt who states she had recent AV fistula creation procedure on 1/14. Verbal consult with Dr. Gwenlyn Found who recommended that pt continue taking her Brilinta. Pt verbalized understanding. Pt also states that she has an upcoming fistula-related procedure in about 5 weeks. Per verbal consult with Dr. Gwenlyn Found, pt should stop taking Brilinta 1 week prior to her upcoming fistula-related procedure and continue taking afterward. Pt verbalized understanding

## 2018-04-18 NOTE — Telephone Encounter (Signed)
New Message   Patient just had fistula surgery for dialysis and wants to know if she should start taking her Brilinta again.

## 2018-04-29 DIAGNOSIS — H35052 Retinal neovascularization, unspecified, left eye: Secondary | ICD-10-CM | POA: Diagnosis not present

## 2018-04-29 DIAGNOSIS — H35713 Central serous chorioretinopathy, bilateral: Secondary | ICD-10-CM | POA: Diagnosis not present

## 2018-04-29 DIAGNOSIS — H2513 Age-related nuclear cataract, bilateral: Secondary | ICD-10-CM | POA: Diagnosis not present

## 2018-04-30 ENCOUNTER — Other Ambulatory Visit: Payer: Self-pay | Admitting: Cardiovascular Disease

## 2018-04-30 DIAGNOSIS — N184 Chronic kidney disease, stage 4 (severe): Secondary | ICD-10-CM | POA: Diagnosis not present

## 2018-04-30 DIAGNOSIS — M3214 Glomerular disease in systemic lupus erythematosus: Secondary | ICD-10-CM | POA: Diagnosis not present

## 2018-04-30 DIAGNOSIS — D631 Anemia in chronic kidney disease: Secondary | ICD-10-CM | POA: Diagnosis not present

## 2018-05-01 ENCOUNTER — Telehealth: Payer: Self-pay | Admitting: Cardiovascular Disease

## 2018-05-01 NOTE — Telephone Encounter (Signed)
New message    Pt c/o medication issue:  1. Name of Medication:   hydrALAZINE (APRESOLINE) 50 MG tablet     2. How are you currently taking this medication (dosage and times per day)? Pt not sure   3. Are you having a reaction (difficulty breathing--STAT)? No   4. What is your medication issue? Pt not sure on correct dosage

## 2018-05-01 NOTE — Telephone Encounter (Signed)
INFORMATION GIVEN TO PATIENT. SHE STATES UNDERSTOOD AND KEEP SOME READING AND CONTACT OFFICE.

## 2018-05-01 NOTE — Telephone Encounter (Signed)
Agree with current recommendation. Will consider hydralazine dose adjustment after re-assessment of BP while taking hydralazine and furosemide as prescribed.

## 2018-05-01 NOTE — Telephone Encounter (Signed)
SPOKE TO PATIENT SHE STATES SHE had been taking medication in correct  Hydralazine 2 (50 mg) three times a day up until @1  and 1.2 weeks ago  In which she switch to correct dos of 50 mg three times a day. Patient thinks blood pressure was more control prior to the switch ( she had surgery during this time blood pressure range was 118/80).  she states that she has not been taken her blood pressure regular only had 2 sets both were done at a doctor's office 149/90 and yesterday's 154/92.  she is concerned if she should retrun to basically 100 mg three times a day due the fact that  she is having an upcoming 2nd surgery in 4 weeks.  RN also asked patient if she was using furosemide as prescribed  every other day. Patient she has not.  RN recommend patient to continue taking 50 mg three times a day and take furosemide as well as  record blood pressure at 3 times  in then next week and contact office. Will also defer to  [facility] -who initial started medication increase. patien verbalized understanding

## 2018-05-14 DIAGNOSIS — E559 Vitamin D deficiency, unspecified: Secondary | ICD-10-CM | POA: Diagnosis not present

## 2018-05-14 DIAGNOSIS — K219 Gastro-esophageal reflux disease without esophagitis: Secondary | ICD-10-CM | POA: Diagnosis not present

## 2018-05-14 DIAGNOSIS — M3214 Glomerular disease in systemic lupus erythematosus: Secondary | ICD-10-CM | POA: Diagnosis not present

## 2018-05-14 DIAGNOSIS — D631 Anemia in chronic kidney disease: Secondary | ICD-10-CM | POA: Diagnosis not present

## 2018-05-14 DIAGNOSIS — H35712 Central serous chorioretinopathy, left eye: Secondary | ICD-10-CM | POA: Diagnosis not present

## 2018-05-14 DIAGNOSIS — E876 Hypokalemia: Secondary | ICD-10-CM | POA: Diagnosis not present

## 2018-05-14 DIAGNOSIS — N185 Chronic kidney disease, stage 5: Secondary | ICD-10-CM | POA: Diagnosis not present

## 2018-05-14 DIAGNOSIS — Z955 Presence of coronary angioplasty implant and graft: Secondary | ICD-10-CM | POA: Diagnosis not present

## 2018-05-14 DIAGNOSIS — I2510 Atherosclerotic heart disease of native coronary artery without angina pectoris: Secondary | ICD-10-CM | POA: Diagnosis not present

## 2018-05-14 DIAGNOSIS — Z7902 Long term (current) use of antithrombotics/antiplatelets: Secondary | ICD-10-CM | POA: Diagnosis not present

## 2018-05-14 DIAGNOSIS — I10 Essential (primary) hypertension: Secondary | ICD-10-CM | POA: Diagnosis not present

## 2018-05-20 DIAGNOSIS — Z01818 Encounter for other preprocedural examination: Secondary | ICD-10-CM | POA: Diagnosis not present

## 2018-05-20 DIAGNOSIS — Z95828 Presence of other vascular implants and grafts: Secondary | ICD-10-CM | POA: Diagnosis not present

## 2018-05-20 DIAGNOSIS — M3214 Glomerular disease in systemic lupus erythematosus: Secondary | ICD-10-CM | POA: Diagnosis not present

## 2018-05-28 DIAGNOSIS — D631 Anemia in chronic kidney disease: Secondary | ICD-10-CM | POA: Diagnosis not present

## 2018-05-28 DIAGNOSIS — N185 Chronic kidney disease, stage 5: Secondary | ICD-10-CM | POA: Diagnosis not present

## 2018-05-30 DIAGNOSIS — M3214 Glomerular disease in systemic lupus erythematosus: Secondary | ICD-10-CM | POA: Diagnosis not present

## 2018-05-30 DIAGNOSIS — I120 Hypertensive chronic kidney disease with stage 5 chronic kidney disease or end stage renal disease: Secondary | ICD-10-CM | POA: Diagnosis not present

## 2018-05-30 DIAGNOSIS — N185 Chronic kidney disease, stage 5: Secondary | ICD-10-CM | POA: Diagnosis not present

## 2018-05-30 DIAGNOSIS — Z992 Dependence on renal dialysis: Secondary | ICD-10-CM | POA: Diagnosis not present

## 2018-05-30 DIAGNOSIS — N186 End stage renal disease: Secondary | ICD-10-CM | POA: Diagnosis not present

## 2018-05-30 DIAGNOSIS — I2510 Atherosclerotic heart disease of native coronary artery without angina pectoris: Secondary | ICD-10-CM | POA: Diagnosis not present

## 2018-05-30 DIAGNOSIS — K219 Gastro-esophageal reflux disease without esophagitis: Secondary | ICD-10-CM | POA: Diagnosis not present

## 2018-05-30 DIAGNOSIS — E785 Hyperlipidemia, unspecified: Secondary | ICD-10-CM | POA: Diagnosis not present

## 2018-05-30 DIAGNOSIS — I129 Hypertensive chronic kidney disease with stage 1 through stage 4 chronic kidney disease, or unspecified chronic kidney disease: Secondary | ICD-10-CM | POA: Diagnosis not present

## 2018-05-30 DIAGNOSIS — N189 Chronic kidney disease, unspecified: Secondary | ICD-10-CM | POA: Diagnosis not present

## 2018-05-30 DIAGNOSIS — D631 Anemia in chronic kidney disease: Secondary | ICD-10-CM | POA: Diagnosis not present

## 2018-05-30 DIAGNOSIS — M109 Gout, unspecified: Secondary | ICD-10-CM | POA: Diagnosis not present

## 2018-06-13 DIAGNOSIS — N185 Chronic kidney disease, stage 5: Secondary | ICD-10-CM | POA: Diagnosis not present

## 2018-06-13 DIAGNOSIS — M3214 Glomerular disease in systemic lupus erythematosus: Secondary | ICD-10-CM | POA: Diagnosis not present

## 2018-06-13 DIAGNOSIS — Z9889 Other specified postprocedural states: Secondary | ICD-10-CM | POA: Diagnosis not present

## 2018-06-13 DIAGNOSIS — D631 Anemia in chronic kidney disease: Secondary | ICD-10-CM | POA: Diagnosis not present

## 2018-06-14 DIAGNOSIS — H2513 Age-related nuclear cataract, bilateral: Secondary | ICD-10-CM | POA: Diagnosis not present

## 2018-06-14 DIAGNOSIS — H35713 Central serous chorioretinopathy, bilateral: Secondary | ICD-10-CM | POA: Diagnosis not present

## 2018-06-14 DIAGNOSIS — H35052 Retinal neovascularization, unspecified, left eye: Secondary | ICD-10-CM | POA: Diagnosis not present

## 2018-07-03 ENCOUNTER — Other Ambulatory Visit: Payer: Self-pay | Admitting: Cardiovascular Disease

## 2018-07-03 MED ORDER — CARVEDILOL 6.25 MG PO TABS: 6.2500 mg | ORAL_TABLET | Freq: Two times a day (BID) | ORAL | 1 refills | Status: DC

## 2018-07-03 MED ORDER — TICAGRELOR 90 MG PO TABS: 90.0000 mg | ORAL_TABLET | Freq: Two times a day (BID) | ORAL | 1 refills | Status: DC

## 2018-07-03 NOTE — Telephone Encounter (Signed)
New Message     *STAT* If patient is at the pharmacy, call can be transferred to refill team.   1. Which medications need to be refilled? (please list name of each medication and dose if known) Brilinta, Carvedilol  2. Which pharmacy/location (including street and city if local pharmacy) is medication to be sent to? [facility] [address] [place]   3. Do they need a 30 day or 90 day supply? 30 or 90   Pt will be out of medication tomorrow

## 2018-07-09 DIAGNOSIS — N189 Chronic kidney disease, unspecified: Secondary | ICD-10-CM | POA: Diagnosis not present

## 2018-07-09 DIAGNOSIS — M3214 Glomerular disease in systemic lupus erythematosus: Secondary | ICD-10-CM | POA: Diagnosis not present

## 2018-07-12 ENCOUNTER — Telehealth: Payer: Self-pay

## 2018-07-12 NOTE — Telephone Encounter (Addendum)
Unable to reach patient. Left message on cell phone and unable to leave message on home phone. Message stated patient will been to contact the office to reschedule appt.   Will route this call to the cancel pool. If patient calls back this patient can be scheduled with Lurena Joiner for first available virtual visit. Please let me know once appt is scheduled and I will contact patient to do prescreen and evisit precall.

## 2018-07-16 ENCOUNTER — Ambulatory Visit: Payer: Medicare Other | Admitting: Cardiology

## 2018-08-08 ENCOUNTER — Other Ambulatory Visit: Payer: Self-pay | Admitting: Cardiovascular Disease

## 2018-08-09 ENCOUNTER — Other Ambulatory Visit: Payer: Self-pay | Admitting: Cardiovascular Disease

## 2018-08-09 DIAGNOSIS — Z79899 Other long term (current) drug therapy: Secondary | ICD-10-CM | POA: Diagnosis not present

## 2018-08-09 DIAGNOSIS — H35361 Drusen (degenerative) of macula, right eye: Secondary | ICD-10-CM | POA: Diagnosis not present

## 2018-08-09 DIAGNOSIS — H35712 Central serous chorioretinopathy, left eye: Secondary | ICD-10-CM | POA: Diagnosis not present

## 2018-08-09 DIAGNOSIS — H35713 Central serous chorioretinopathy, bilateral: Secondary | ICD-10-CM | POA: Diagnosis not present

## 2018-08-09 DIAGNOSIS — H35052 Retinal neovascularization, unspecified, left eye: Secondary | ICD-10-CM | POA: Diagnosis not present

## 2018-08-27 DIAGNOSIS — N189 Chronic kidney disease, unspecified: Secondary | ICD-10-CM | POA: Diagnosis not present

## 2018-09-09 DIAGNOSIS — H35052 Retinal neovascularization, unspecified, left eye: Secondary | ICD-10-CM | POA: Diagnosis not present

## 2018-09-09 DIAGNOSIS — Z79899 Other long term (current) drug therapy: Secondary | ICD-10-CM | POA: Diagnosis not present

## 2018-09-09 DIAGNOSIS — H3554 Dystrophies primarily involving the retinal pigment epithelium: Secondary | ICD-10-CM | POA: Diagnosis not present

## 2018-09-09 DIAGNOSIS — H35712 Central serous chorioretinopathy, left eye: Secondary | ICD-10-CM | POA: Diagnosis not present

## 2018-09-09 DIAGNOSIS — H35713 Central serous chorioretinopathy, bilateral: Secondary | ICD-10-CM | POA: Diagnosis not present

## 2018-09-11 DIAGNOSIS — B370 Candidal stomatitis: Secondary | ICD-10-CM | POA: Diagnosis not present

## 2018-09-11 DIAGNOSIS — Z79899 Other long term (current) drug therapy: Secondary | ICD-10-CM | POA: Diagnosis not present

## 2018-09-11 DIAGNOSIS — M3214 Glomerular disease in systemic lupus erythematosus: Secondary | ICD-10-CM | POA: Diagnosis not present

## 2018-09-11 DIAGNOSIS — M109 Gout, unspecified: Secondary | ICD-10-CM | POA: Diagnosis not present

## 2018-09-11 DIAGNOSIS — M1A9XX0 Chronic gout, unspecified, without tophus (tophi): Secondary | ICD-10-CM | POA: Diagnosis not present

## 2018-09-16 DIAGNOSIS — D631 Anemia in chronic kidney disease: Secondary | ICD-10-CM | POA: Diagnosis not present

## 2018-09-16 DIAGNOSIS — N185 Chronic kidney disease, stage 5: Secondary | ICD-10-CM | POA: Diagnosis not present

## 2018-09-17 DIAGNOSIS — Z1231 Encounter for screening mammogram for malignant neoplasm of breast: Secondary | ICD-10-CM | POA: Diagnosis not present

## 2018-09-23 DIAGNOSIS — M3214 Glomerular disease in systemic lupus erythematosus: Secondary | ICD-10-CM | POA: Diagnosis not present

## 2018-09-23 DIAGNOSIS — M1A9XX0 Chronic gout, unspecified, without tophus (tophi): Secondary | ICD-10-CM | POA: Diagnosis not present

## 2018-09-27 ENCOUNTER — Telehealth: Payer: Self-pay | Admitting: Adult Health

## 2018-09-27 NOTE — Telephone Encounter (Signed)
call home phone/ consent/ my chart via emailed/ pre reg completed

## 2018-09-30 DIAGNOSIS — D631 Anemia in chronic kidney disease: Secondary | ICD-10-CM | POA: Diagnosis not present

## 2018-09-30 DIAGNOSIS — N185 Chronic kidney disease, stage 5: Secondary | ICD-10-CM | POA: Diagnosis not present

## 2018-09-30 NOTE — Progress Notes (Signed)
Virtual Visit via Telephone Note   This visit type was conducted due to national recommendations for restrictions regarding the COVID-19 Pandemic (e.g. social distancing) in an effort to limit this patient's exposure and mitigate transmission in our community.  Due to her co-morbid illnesses, this patient is at least at moderate risk for complications without adequate follow up.  This format is felt to be most appropriate for this patient at this time.  The patient did not have access to video technology/had technical difficulties with video requiring transitioning to audio format only (telephone).  All issues noted in this document were discussed and addressed.  No physical exam could be performed with this format.  Please refer to the patient's chart for her  consent to telehealth for [facility].   Date:  6/81/2751   ID:  Lindsey York, DOB [birth year], MRN [id number]  Patient Location: Home Provider Location: Office  PCP:  System, Pcp Not In  Cardiologist:  Quay Burow, MD  Electrophysiologist:  None   Evaluation Performed:  Follow-Up Visit  Chief Complaint:  Hypertension   History of Present Illness:    Lindsey York is a 53 y.o. female we are following for ongoing assessment and management of coronary artery disease, history of PCI in May 2019 after subtotally occluded first marginal branch which was stented with DES and September 2019-DES to RCA;, hypertension, hyperlipidemia.  She remained on Brilinta and aspirin. Other history to include chronic kidney disease stage IV-5, SLE, RCRI deemed 6.6%.  She was last seen in the office on 03/05/2018 by Melina Copa, PA for preoperative evaluation. AV fistula was completed in December of 03/2018.   She has complaints of myalgia pain on atorvastatin when she is walking and riding stationary bike. She recalls that atorvastatin caused her to have this pain. She has lost 10 lbs since being seen last.This was unexpected.  She noted that  her BP has dropped with the weight loss. Systolic <638. She stopped the middle dose of clonidine and hydralazine, 122/89 is most recent recording. 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 She continues to take lasix QOD.   She also states that recent blood work revealed that she was anemic with a hemoglobin of 7.5 which is being followed by nephrology.  She is being weaned from prednisone setting of lupus exacerbation, and also because she had fluid behind her eye caused by the high doses of prednisone.  The patient does not have symptoms concerning for COVID-19 infection (fever, chills, cough, or new shortness of breath).    Past Medical History:  Diagnosis Date   Anemia    CKD (chronic kidney disease), stage V ([place])    "followed at [facility]" (08/28/2017)   Coronary artery disease    Diarrhea 09/2017   GERD (gastroesophageal reflux disease)    High cholesterol    History of gout    "on daily RX"" (08/28/2017)   Hypertension    NSTEMI (non-ST elevated myocardial infarction) ([place]) 08/26/2017   Systemic lupus ([place])    Past Surgical History:  Procedure Laterality Date   BREAST SURGERY Left ~ 1978   "tumors taken out"   CORONARY ANGIOGRAPHY N/A 12/31/2017   Procedure: CORONARY ANGIOGRAPHY;  Surgeon: Lorretta Harp, MD;  Location: [place] [facility];  Service: Cardiovascular;  Laterality: N/A;   CORONARY STENT INTERVENTION N/A 08/28/2017   Procedure: CORONARY STENT INTERVENTION;  Surgeon: Burnell Blanks, MD;  Location: [place] [facility];  Service: Cardiovascular;  Laterality: N/A;   CORONARY STENT INTERVENTION  12/31/2017   CORONARY STENT INTERVENTION N/A 12/31/2017   Procedure: CORONARY STENT INTERVENTION;  Surgeon: Lorretta Harp, MD;  Location: [place] [facility];  Service: Cardiovascular;  Laterality: N/A;   HERNIA REPAIR     LAPAROSCOPIC CHOLECYSTECTOMY     LEFT HEART CATH AND CORONARY ANGIOGRAPHY N/A 08/28/2017   Procedure: LEFT HEART CATH AND CORONARY ANGIOGRAPHY;   Surgeon: Burnell Blanks, MD;  Location: [place] [facility];  Service: Cardiovascular;  Laterality: N/A;   MOUTH SURGERY     oral cavity biopsy   TUBAL LIGATION     UMBILICAL HERNIA REPAIR       Current Meds  Medication Sig   acetaminophen (TYLENOL) 325 MG tablet Take 650 mg by mouth every 6 (six) hours as needed for moderate pain.   allopurinol (ZYLOPRIM) 100 MG tablet Take 200 mg by mouth daily.    amLODipine (NORVASC) 10 MG tablet Take 10 mg by mouth daily.   aspirin 81 MG chewable tablet Chew 1 tablet (81 mg total) by mouth daily.   atorvastatin (LIPITOR) 40 MG tablet Take 1 tablet (40 mg total) by mouth daily.   bevacizumab (AVASTIN) 400 MG/16ML SOLN Inject 1.25 mg as directed every 30 (thirty) days.   BRILINTA 90 MG TABS tablet TAKE 1 TABLET BY MOUTH TWICE DAILY   calcitRIOL (ROCALTROL) 0.25 MCG capsule Take 0.25 mcg by mouth 3 (three) times a week. Monday, Wednesday, Friday   carvedilol (COREG) 6.25 MG tablet Take 1 tablet (6.25 mg total) by mouth 2 (two) times daily with a meal.   cloNIDine (CATAPRES) 0.1 MG tablet Take 1 tab at 8am, 4pm and midnight   furosemide (LASIX) 80 MG tablet Take 1 tablet (80 mg total) by mouth every other day. Take 1 tablet daily for 3 days and then go back to taking 1 tablet every other day. (Patient taking differently: Take 80 mg by mouth every other day. )   hydrALAZINE (APRESOLINE) 50 MG tablet TAKE 1 TABLET BY MOUTH THREE TIMES DAILY   lactulose (CHRONULAC) 10 GM/15ML solution Take 30 mLs by mouth 3 (three) times daily as needed for constipation.   oxybutynin (DITROPAN) 5 MG tablet Take 5 mg by mouth daily as needed for bladder spasms.   predniSONE (DELTASONE) 1 MG tablet Take 1 mg by mouth daily with breakfast.   predniSONE (DELTASONE) 5 MG tablet Take 5 mg by mouth daily with breakfast.   ranitidine (ZANTAC) 150 MG tablet Take 150 mg by mouth every morning.    sevelamer carbonate (RENVELA) 800 MG tablet Take 800 mg by  mouth 3 (three) times daily with meals.    sodium bicarbonate 650 MG tablet Take 1,300 mg by mouth 2 (two) times daily.   [DISCONTINUED] famotidine (PEPCID) 20 MG tablet Take 1 tablet (20 mg total) by mouth daily.     Allergies:   Hydroxychloroquine and Codeine   Social History   Tobacco Use   Smoking status: Never Smoker   Smokeless tobacco: Never Used  Substance Use Topics   Alcohol use: Never    Frequency: Never   Drug use: Never     Family Hx: The patient's family history is not on file.  ROS:   Please see the history of present illness.    All other systems reviewed and are negative.   Prior CV studies:   The following studies were reviewed today: Cardiac Cath 08/28/2017  Prox RCA lesion is 30% stenosed.  Mid RCA lesion is 80% stenosed.  Ost 1st Diag  to 1st Diag lesion is 40% stenosed.  Mid LAD lesion is 30% stenosed.  Ost 2nd Mrg lesion is 99% stenosed.  A drug-eluting stent was successfully placed using a STENT SYNERGY DES 2.25X20.  Post intervention, there is a 0% residual stenosis.   1. NSTEMI secondary to severe stenosis in the second obtuse marginal branch 2. Successful PTCA/DES x 1 second OM branch 3. Severe stenosis in the mid segment of the large dominant RCA. This is not critical but will need to be addressed with PCI later this week.  4. Mild non-obstructive disease in the LAD and Diagonal  Recommendations: Continue DAPT with ASA and Brilinta for one year. We used 80 cc contrast dye for her diagnostic cath and PCI. Will hydrate aggressively given her CKD and will plan staged PCI of the RCA later this week if renal function remains stable post PCI.   Staged PCI 12/31/2017  Mid RCA lesion is 80% stenosed.  Previously placed Ost 2nd Mrg to 2nd Mrg stent (unknown type) is widely patent.  A drug-eluting stent was successfully placed.  Post intervention, there is a 0% residual stenosis.  Echocardiogram 08/29/2017  Left ventricle: The cavity  size was normal. Wall thickness was   increased in a pattern of moderate LVH. Systolic function was   normal. The estimated ejection fraction was in the range of 60%   to 65%. Wall motion was normal; there were no regional wall   motion abnormalities. Doppler parameters are consistent with   abnormal left ventricular relaxation (grade 1 diastolic   dysfunction). The E/e&' ratio is between 8-15, suggesting   indeterminate LV filling pressure. - Aortic valve: Trileaflet. There was no stenosis. There was   trivial regurgitation. - Mitral valve: Mildly thickened leaflets . There was trivial   regurgitation. - Left atrium: The atrium was normal in size. - Tricuspid valve: There was trivial regurgitation. - Pulmonary arteries: PA peak pressure: 21 mm Hg (S). - Inferior vena cava: The vessel was normal in size. The   respirophasic diameter changes were in the normal range (>= 50%),   consistent with normal central venous pressure.  Impressions:  - LVEF 60-65%, moderate LVH, normal wall motion, grade 1 DD,   indeterminate LV filling pressure, trivial AI, trivial MR, normal   LA Size, trivial TR, RVSP 21 mmHg, normal IVC.   Labs/Other Tests and Data Reviewed:    EKG:  No ECG reviewed.  Recent Labs: 12/10/2017: ALT 12 01/01/2018: BUN 63; Creatinine, Ser 4.43; Hemoglobin 10.6; Platelets 185; Potassium 4.1; Sodium 141   Recent Lipid Panel Lab Results  Component Value Date/Time   CHOL 170 12/10/2017 10:23 AM   TRIG 98 12/10/2017 10:23 AM   HDL 66 12/10/2017 10:23 AM   CHOLHDL 2.6 12/10/2017 10:23 AM   CHOLHDL 3.8 08/30/2017 02:27 AM   LDLCALC 84 12/10/2017 10:23 AM    Wt Readings from Last 3 Encounters:  10/01/18 163 lb (73.9 kg)  01/11/18 171 lb 9.6 oz (77.8 kg)  01/01/18 164 lb 14.5 oz (74.8 kg)     Objective:    Vital Signs:  BP 122/89    Pulse 79    Ht 5\' 3"  (1.6 m)    Wt 163 lb (73.9 kg)    BMI 28.87 kg/m    VITAL SIGNS:  reviewed GEN:  no acute  distress MUSCULOSKELETAL:  Painful muscles NEURO:  alert and oriented x 3, no obvious focal deficit PSYCH:  normal affect  ASSESSMENT & PLAN:    1.  Hypertension: Blood pressure has been going down steadily, she has lost weight and is noticing her pressures are less than 628 systolic.  On her own she stopped taking the middle doses of clonidine 0.1 mg, and hydralazine 50 mg, but continues to take twice daily dosing.  The patient has noticed that she is feeling better with better blood pressures with the exception of diastolic blood pressure rising some.  I will have her take hydralazine 50 mg in the morning 20 5 in the afternoon and 50 in the evening.  She is to continue to take blood pressures blood pressures less than 315 systolic.  I would like to take her off clonidine eventually but do this now and continue to wean medications first rebound hypertension as an issue with weaning.  2.  Hypercholesterolemia: She complains of significant myalgia pain in her legs and arms on atorvastatin.  She recalls that she had this similar reaction when she was taking it in the past.  She had been on a different statin medication but does not remember the name of it, and was changed to atorvastatin as it was not getting her cholesterol to goal.  I will start her on Zetia 10 mg p.o. daily, give her a week's break off of the atorvastatin, to help her with myalgia pain and then begin her at a lower dose of simvastatin 20 mg daily.  We will see how she does with change in medication with a medication holiday for 1 week off the statin therapy at a higher dose.  Goal of LDL 70 or less  3.  Chronic kidney disease stage IV-V: She is continue to work with nephrology but has not yet had dialysis started.  AV fistula is now in place and has matured.  She will follow-up with Dr. Emilia Beck at [facility] in August for ongoing discussion and management  4.  Coronary artery disease: Patient has a drug-eluting  stent to a subtotally occluded first marginal completed in September 2019, drug-eluting stent to RCA.  Denies recurrent chest pain or dyspnea on exertion.  Continue secondary prevention.  5.  Anemia: Hemoglobin 7.5.  She denies hemoptysis or bleeding while on Brilinta.  We will continue for now.  COVID-19 Education: The signs and symptoms of COVID-19 were discussed with the patient and how to seek care for testing (follow up with PCP or arrange E-visit).  The importance of social distancing was discussed today.  Time:   Today, I have spent 20 minutes with the patient with telehealth technology discussing the above problems.     Medication Adjustments/Labs and Tests Ordered: Current medicines are reviewed at length with the patient today.  Concerns regarding medicines are outlined above.   Tests Ordered: No orders of the defined types were placed in this encounter.   Medication Changes: #1.  Stop atorvastatin for 1 week.  Then begin simvastatin 20 mg daily #2.  Begin Zetia 10 mg daily #3.  Change middle dose of hydralazine to 25 mg- - 50 mg a.m., 25 mg afternoon, 50 mg p.m.  Disposition:  Follow up 2 weeks virtual visit  Signed, Phill Myron. West Pugh, ANP, AACC  10/01/2018 3:27 PM    [facility]

## 2018-10-01 ENCOUNTER — Telehealth (INDEPENDENT_AMBULATORY_CARE_PROVIDER_SITE_OTHER): Payer: Medicare Other | Admitting: Adult Health

## 2018-10-01 ENCOUNTER — Telehealth: Payer: Self-pay

## 2018-10-01 VITALS — BP 122/89 | HR 79 | Ht 63.0 in | Wt 163.0 lb

## 2018-10-01 DIAGNOSIS — N185 Chronic kidney disease, stage 5: Secondary | ICD-10-CM

## 2018-10-01 DIAGNOSIS — M329 Systemic lupus erythematosus, unspecified: Secondary | ICD-10-CM

## 2018-10-01 DIAGNOSIS — M791 Myalgia, unspecified site: Secondary | ICD-10-CM | POA: Diagnosis not present

## 2018-10-01 DIAGNOSIS — E7800 Pure hypercholesterolemia, unspecified: Secondary | ICD-10-CM

## 2018-10-01 DIAGNOSIS — I10 Essential (primary) hypertension: Secondary | ICD-10-CM | POA: Diagnosis not present

## 2018-10-01 DIAGNOSIS — T466X5A Adverse effect of antihyperlipidemic and antiarteriosclerotic drugs, initial encounter: Secondary | ICD-10-CM

## 2018-10-01 MED ORDER — EZETIMIBE 10 MG PO TABS: 10.0000 mg | ORAL_TABLET | Freq: Every day | ORAL | 3 refills | Status: DC

## 2018-10-01 MED ORDER — SIMVASTATIN 20 MG PO TABS: 20.0000 mg | ORAL_TABLET | Freq: Every day | ORAL | 3 refills | Status: DC

## 2018-10-01 MED ORDER — HYDRALAZINE HCL 50 MG PO TABS: ORAL_TABLET | ORAL | 3 refills | Status: DC

## 2018-10-01 NOTE — Patient Instructions (Addendum)
Medication Instructions:  Your physician has recommended you make the following change in your medication:   HOLD YOUR ATORVASTATIN (LIPITOR) FOR ONE WEEK (7 DAYS).  AFTER ONE WEEK, YOU WILL START TAKING SIMVASTATIN (ZOCOR) 20 MG DAILY. THIS WILL REPLACE YOUR ATORVASTATIN.  START EZETIMIBE (ZETIA) 10 MG DAILY.  PLEASE FOLLOW THESE INSTRUCTIONS FOR TAKING YOUR HYDRALAZINE: Take one 50 mg tablet in the morning. Take half a tablet (25 mg) in the afternoon. Take one 50 mg tablet in the evening.   If you need a refill on your cardiac medications before your next appointment, please call your pharmacy.   Lab work: NONE If you have labs (blood work) drawn today and your tests are completely normal, you will receive your results only by: Marland Kitchen MyChart Message (if you have MyChart) OR . A paper copy in the mail If you have any lab test that is abnormal or we need to change your treatment, we will call you to review the results.  Testing/Procedures: NONE  Follow-Up: At [facility], you and your health needs are our priority.  As part of our continuing mission to provide you with exceptional heart care, we have created designated Provider Care Teams.  These Care Teams include your primary Cardiologist (physician) and Advanced Practice Providers (APPs -  Physician Assistants and Nurse Practitioners) who all work together to provide you with the care you need, when you need it. You will need a follow up appointment in 2 weeks with [name], DNP. Marland Kitchen [name], DNP, ANP

## 2018-10-01 NOTE — Telephone Encounter (Signed)
LMTCB TO DISCUSS 6/30 AVS.   Letter including After Visit Summary and any other necessary documents to be mailed to the patient's address on file.

## 2018-10-04 ENCOUNTER — Other Ambulatory Visit: Payer: Self-pay | Admitting: Cardiovascular Disease

## 2018-10-09 ENCOUNTER — Other Ambulatory Visit: Payer: Self-pay | Admitting: Cardiovascular Disease

## 2018-10-14 ENCOUNTER — Telehealth: Payer: Self-pay | Admitting: Adult Health

## 2018-10-14 DIAGNOSIS — N185 Chronic kidney disease, stage 5: Secondary | ICD-10-CM | POA: Diagnosis not present

## 2018-10-14 DIAGNOSIS — D631 Anemia in chronic kidney disease: Secondary | ICD-10-CM | POA: Diagnosis not present

## 2018-10-14 NOTE — Progress Notes (Signed)
Virtual Visit via Telephone Note   This visit type was conducted due to national recommendations for restrictions regarding the COVID-19 Pandemic (e.g. social distancing) in an effort to limit this patient's exposure and mitigate transmission in our community.  Due to her co-morbid illnesses, this patient is at least at moderate risk for complications without adequate follow up.  This format is felt to be most appropriate for this patient at this time.  The patient did not have access to video technology/had technical difficulties with video requiring transitioning to audio format only (telephone).  All issues noted in this document were discussed and addressed.  No physical exam could be performed with this format.  Please refer to the patient's chart for her  consent to telehealth for [facility].   Date:  4/74/2595   ID:  Lindsey York, DOB [birth year], MRN [id number]  Patient Location: Home Provider Location: Office  PCP:  System, Pcp Not In  Cardiologist:  Quay Burow, MD  Electrophysiologist:  None   Evaluation Performed:  Follow-Up Visit  Chief Complaint:  Follow Up   History of Present Illness:    Lindsey York is a 53 y.o. female we are following for ongoing assessment and management of coronary artery disease, history of PCI in May 2019 after subtotally occluded first marginal branch which was stented with DES and September 2019-DES to RCA;, hypertension, hyperlipidemia.  She remained on Brilinta and aspirin. Other history to include chronic kidney disease stage IV-5, SLE, RCRI deemed 6.6%.   She complained of myalgia pain on atorvastatin especially during exercise. She had lost 10 lbs from diet and exercise, and had some hypotension. She stopped the middle of the day dose of clonidine and hydralazine with improvement in BP. She did have some elevation of her BP in the afternoon, and therefore she was to take 50 mg of hydralazine in the am and pm, and only 25 mg in the  mid day dose. She was to continue to take her BP and record.   For myalgia pain, she was to stop simvastatin for one week to see if her symptoms had improved. She was to begin a lower dose of simvastatin 20 mg daily. She is here for follow up and response to her medications.   She states she is doing much better.  The lower dose in the afternoon is been helpful to her at the 25 mg of hydralazine.  She is requesting that we send in a prescription for the 25 mg hydralazine midday dose as she is having trouble cutting the 50 mg in half.  She is also requesting a refill on ranitidine 150 mg daily.  The myalgia pain is gotten better on the lower dose of simvastatin and she is taking Zetia as directed.  She is continuing to try and exercise and walk each day.  The patient does not  have symptoms concerning for COVID-19 infection (fever, chills, cough, or new shortness of breath).    Past Medical History:  Diagnosis Date  . Anemia   . CKD (chronic kidney disease), stage V ([place])    "followed at [facility]" (08/28/2017)  . Coronary artery disease   . Diarrhea 09/2017  . GERD (gastroesophageal reflux disease)   . High cholesterol   . History of gout    "on daily RX"" (08/28/2017)  . Hypertension   . NSTEMI (non-ST elevated myocardial infarction) ([place]) 08/26/2017  . Systemic lupus ([place])    Past Surgical History:  Procedure Laterality Date  . BREAST  SURGERY Left ~ 1978   "tumors taken out"  . CORONARY ANGIOGRAPHY N/A 12/31/2017   Procedure: CORONARY ANGIOGRAPHY;  Surgeon: Lorretta Harp, MD;  Location: [place] [facility];  Service: Cardiovascular;  Laterality: N/A;  . CORONARY STENT INTERVENTION N/A 08/28/2017   Procedure: CORONARY STENT INTERVENTION;  Surgeon: Burnell Blanks, MD;  Location: [place] [facility];  Service: Cardiovascular;  Laterality: N/A;  . CORONARY STENT INTERVENTION  12/31/2017  . CORONARY STENT INTERVENTION N/A 12/31/2017   Procedure: CORONARY STENT INTERVENTION;  Surgeon:  Lorretta Harp, MD;  Location: [place] [facility];  Service: Cardiovascular;  Laterality: N/A;  . HERNIA REPAIR    . LAPAROSCOPIC CHOLECYSTECTOMY    . LEFT HEART CATH AND CORONARY ANGIOGRAPHY N/A 08/28/2017   Procedure: LEFT HEART CATH AND CORONARY ANGIOGRAPHY;  Surgeon: Burnell Blanks, MD;  Location: [place] [facility];  Service: Cardiovascular;  Laterality: N/A;  . MOUTH SURGERY     oral cavity biopsy  . TUBAL LIGATION    . UMBILICAL HERNIA REPAIR       Current Meds  Medication Sig  . acetaminophen (TYLENOL) 325 MG tablet Take 650 mg by mouth every 6 (six) hours as needed for moderate pain.  Marland Kitchen allopurinol (ZYLOPRIM) 100 MG tablet Take 200 mg by mouth daily.   Marland Kitchen amLODipine (NORVASC) 10 MG tablet Take 10 mg by mouth daily.  Marland Kitchen aspirin 81 MG chewable tablet Chew 1 tablet (81 mg total) by mouth daily.  . bevacizumab (AVASTIN) 400 MG/16ML SOLN Inject 1.25 mg as directed every 30 (thirty) days.  Marland Kitchen BRILINTA 90 MG TABS tablet TAKE 1 TABLET BY MOUTH TWICE DAILY  . calcitRIOL (ROCALTROL) 0.25 MCG capsule Take 0.25 mcg by mouth 3 (three) times a week. Monday, Wednesday, Friday  . carvedilol (COREG) 6.25 MG tablet Take 1 tablet (6.25 mg total) by mouth 2 (two) times daily with a meal.  . cloNIDine (CATAPRES) 0.1 MG tablet Take 1 tab at 8am, 4pm and midnight (Patient taking differently: Take 1 tab at 8am and midnight)  . ezetimibe (ZETIA) 10 MG tablet Take 1 tablet (10 mg total) by mouth daily.  . furosemide (LASIX) 80 MG tablet Take 1 tablet (80 mg total) by mouth every other day. Take 1 tablet daily for 3 days and then go back to taking 1 tablet every other day. (Patient taking differently: Take 80 mg by mouth every other day. )  . hydrALAZINE (APRESOLINE) 50 MG tablet ON A DAILY BASIS: Take one 50 mg tablet in the morning. Take half a tablet (25 mg) in the afternoon. Take one 50 mg tablet in the evening.  . lactulose (CHRONULAC) 10 GM/15ML solution Take 30 mLs by mouth 3 (three) times  daily as needed for constipation.  . nitroGLYCERIN (NITROSTAT) 0.4 MG SL tablet Place 1 tablet (0.4 mg total) under the tongue every 5 (five) minutes as needed for chest pain.  Marland Kitchen oxybutynin (DITROPAN) 5 MG tablet Take 5 mg by mouth daily as needed for bladder spasms.  . predniSONE (DELTASONE) 1 MG tablet Take 1 mg by mouth daily with breakfast. Pt takes 5mg  one day and 4mg  the next day  . predniSONE (DELTASONE) 5 MG tablet Take 5 mg by mouth daily with breakfast.  . ranitidine (ZANTAC) 150 MG tablet Take 150 mg by mouth every morning.   . sevelamer carbonate (RENVELA) 800 MG tablet Take 800 mg by mouth 3 (three) times daily with meals.   . simvastatin (ZOCOR) 20 MG tablet Take 1  tablet (20 mg total) by mouth at bedtime.  . sodium bicarbonate 650 MG tablet Take 1,300 mg by mouth 2 (two) times daily.     Allergies:   Hydroxychloroquine and Codeine   Social History   Tobacco Use  . Smoking status: Never Smoker  . Smokeless tobacco: Never Used  Substance Use Topics  . Alcohol use: Never    Frequency: Never  . Drug use: Never     Family Hx: The patient's family history is not on file.  ROS:   Please see the history of present illness.    All other systems reviewed and are negative.   Prior CV studies:   The following studies were reviewed today:  Cardiac Intervention 12/31/2017     Mid RCA lesion is 80% stenosed.  Previously placed Ost 2nd Mrg to 2nd Mrg stent (unknown type) is widely patent.  A drug-eluting stent was successfully placed.  Post intervention, there is a 0% residual stenosis.   Echocardiogram 08/29/2017 Left ventricle: The cavity size was normal. Wall thickness was   increased in a pattern of moderate LVH. Systolic function was   normal. The estimated ejection fraction was in the range of 60%   to 65%. Wall motion was normal; there were no regional wall   motion abnormalities. Doppler parameters are consistent with   abnormal left ventricular relaxation  (grade 1 diastolic   dysfunction). The E/e&' ratio is between 8-15, suggesting   indeterminate LV filling pressure. - Aortic valve: Trileaflet. There was no stenosis. There was   trivial regurgitation. - Mitral valve: Mildly thickened leaflets . There was trivial   regurgitation. - Left atrium: The atrium was normal in size. - Tricuspid valve: There was trivial regurgitation. - Pulmonary arteries: PA peak pressure: 21 mm Hg (S). - Inferior vena cava: The vessel was normal in size. The   respirophasic diameter changes were in the normal range (>= 50%),   consistent with normal central venous pressure.  Labs/Other Tests and Data Reviewed:    EKG:  No ECG reviewed.  Recent Labs: 12/10/2017: ALT 12 01/01/2018: BUN 63; Creatinine, Ser 4.43; Hemoglobin 10.6; Platelets 185; Potassium 4.1; Sodium 141   Recent Lipid Panel Lab Results  Component Value Date/Time   CHOL 170 12/10/2017 10:23 AM   TRIG 98 12/10/2017 10:23 AM   HDL 66 12/10/2017 10:23 AM   CHOLHDL 2.6 12/10/2017 10:23 AM   CHOLHDL 3.8 08/30/2017 02:27 AM   LDLCALC 84 12/10/2017 10:23 AM    Wt Readings from Last 3 Encounters:  10/15/18 164 lb (74.4 kg)  10/01/18 163 lb (73.9 kg)  01/11/18 171 lb 9.6 oz (77.8 kg)     Objective:    Vital Signs:  BP 129/86   Pulse 80   Ht 5\' 3"  (1.6 m)   Wt 164 lb (74.4 kg)   BMI 29.05 kg/m    VITAL SIGNS:  reviewed GEN:  no acute distress NEURO:  alert and oriented x 3, no obvious focal deficit PSYCH:  normal affect  ASSESSMENT & PLAN:    1. Hypertension: Excellent control of blood pressure on current medication regimen.  My plan is to continue regimen of hydralazine 50 mg in the a.m. 25 mg at lunchtime and 50 mg in the p.m.  She is requesting a prescription for the 25 mg dose of hydralazine as it is difficult for her to cut in half and she wants to make sure she is getting the correct dose.  This will be called into her  pharmacy.  2.  Hypercholesterolemia: She is on simvastatin  20 mg daily along with Zetia 10 mg daily.  She was intolerant to higher doses in the setting of significant myalgias especially in her legs when she was walking.  She will have repeat lipids and LFTs on November 04, 2018 and see Dr. Alvester Chou on follow-up on November 08, 2018.  He will discuss results with her at that time and make further recommendations if necessary.  3.  GERD: Refill on Renate Marlou Sa is provided.  COVID-19 Education: The signs and symptoms of COVID-19 were discussed with the patient and how to seek care for testing (follow up with PCP or arrange E-visit).  The importance of social distancing was discussed today.  Time:   Today, I have spent 10 minutes with the patient with telehealth technology discussing the above problems.     Medication Adjustments/Labs and Tests Ordered: Current medicines are reviewed at length with the patient today.  Concerns regarding medicines are outlined above.   Tests Ordered: Lipids and LFT's and BMET  Medication Changes: No orders of the defined types were placed in this encounter.   Disposition:  Follow up November 08, 2018 previously scheduled.    Signed, Phill Myron. West Pugh, ANP, AACC  10/15/2018 1:27 PM    [facility]

## 2018-10-14 NOTE — Telephone Encounter (Signed)
I called pt to confirm appt with [name] on 10-15-18.     1. Confirm consent - "In the setting of the current Covid19 crisis, you are scheduled for a (phone or video) visit with your provider on (date) at (time).  Just as we do with many in-office visits, in order for you to participate in this visit, we must obtain consent.  If you'd like, I can send this to your mychart (if signed up) or email for you to review.  Otherwise, I can obtain your verbal consent now.  All virtual visits are billed to your insurance company just like a normal visit would be.  By agreeing to a virtual visit, we'd like you to understand that the technology does not allow for your provider to perform an examination, and thus may limit your provider's ability to fully assess your condition. If your provider identifies any concerns that need to be evaluated in person, we will make arrangements to do so.  Finally, though the technology is pretty good, we cannot assure that it will always work on either your or our end, and in the setting of a video visit, we may have to convert it to a phone-only visit.  In either situation, we cannot ensure that we have a secure connection.  Are you willing to proceed?" STAFF: Did the patient verbally acknowledge consent to telehealth visit? Document YES/NO here: Yes       FULL LENGTH CONSENT FOR TELE-HEALTH VISIT   I hereby voluntarily request, consent and authorize [facility] and its employed or contracted physicians, physician assistants, nurse practitioners or other licensed health care professionals (the Practitioner), to provide me with telemedicine health care services (the Services") as deemed necessary by the treating Practitioner. I acknowledge and consent to receive the Services by the Practitioner via telemedicine. I understand that the telemedicine visit will involve communicating with the Practitioner through live audiovisual communication technology and the disclosure  of certain medical information by electronic transmission. I acknowledge that I have been given the opportunity to request an in-person assessment or other available alternative prior to the telemedicine visit and am voluntarily participating in the telemedicine visit.  I understand that I have the right to withhold or withdraw my consent to the use of telemedicine in the course of my care at any time, without affecting my right to future care or treatment, and that the Practitioner or I may terminate the telemedicine visit at any time. I understand that I have the right to inspect all information obtained and/or recorded in the course of the telemedicine visit and may receive copies of available information for a reasonable fee.  I understand that some of the potential risks of receiving the Services via telemedicine include:   Delay or interruption in medical evaluation due to technological equipment failure or disruption;  Information transmitted may not be sufficient (e.g. poor resolution of images) to allow for appropriate medical decision making by the Practitioner; and/or   In rare instances, security protocols could fail, causing a breach of personal health information.  Furthermore, I acknowledge that it is my responsibility to provide information about my medical history, conditions and care that is complete and accurate to the best of my ability. I acknowledge that Practitioner's advice, recommendations, and/or decision may be based on factors not within their control, such as incomplete or inaccurate data provided by me or distortions of diagnostic images or specimens that may result from electronic transmissions. I understand that the practice of medicine  is not an Chief Strategy Officer and that Practitioner makes no warranties or guarantees regarding treatment outcomes. I acknowledge that I will receive a copy of this consent concurrently upon execution via email to the email address I last provided  but may also request a printed copy by calling the office of [facility].    I understand that my insurance will be billed for this visit.   I have read or had this consent read to me.  I understand the contents of this consent, which adequately explains the benefits and risks of the Services being provided via telemedicine.   I have been provided ample opportunity to ask questions regarding this consent and the Services and have had my questions answered to my satisfaction.  I give my informed consent for the services to be provided through the use of telemedicine in my medical care  By participating in this telemedicine visit I agree to the above.

## 2018-10-15 ENCOUNTER — Telehealth (INDEPENDENT_AMBULATORY_CARE_PROVIDER_SITE_OTHER): Payer: Medicare Other | Admitting: Adult Health

## 2018-10-15 ENCOUNTER — Encounter: Payer: Self-pay | Admitting: Adult Health

## 2018-10-15 VITALS — BP 129/86 | HR 80 | Ht 63.0 in | Wt 164.0 lb

## 2018-10-15 DIAGNOSIS — E785 Hyperlipidemia, unspecified: Secondary | ICD-10-CM | POA: Diagnosis not present

## 2018-10-15 DIAGNOSIS — Z79899 Other long term (current) drug therapy: Secondary | ICD-10-CM

## 2018-10-15 MED ORDER — HYDRALAZINE HCL 25 MG PO TABS: 25.0000 mg | ORAL_TABLET | Freq: Every day | ORAL | 3 refills | Status: DC

## 2018-10-15 NOTE — Patient Instructions (Addendum)
Medication Instructions:  Continue current medications  If you need a refill on your cardiac medications before your next appointment, please call your pharmacy.  Labwork: Fasting Lipids Liver and BMP HERE IN OUR OFFICE AT [facility]  You will need to fast. DO NOT EAT OR DRINK PAST MIDNIGHT.     You will NOT need to fast   Take the provided lab slips with you to the lab for your blood draw.   When you have your labs (blood work) drawn today and your tests are completely normal, you will receive your results only by MyChart Message (if you have MyChart) -OR-  A paper copy in the mail.  If you have any lab test that is abnormal or we need to change your treatment, we will call you to review these results.  Testing/Procedures: None Ordered  Follow-Up: .  Keep appt with Dr Gwenlyn Found on August 7th  At [facility], you and your health needs are our priority.  As part of our continuing mission to provide you with exceptional heart care, we have created designated Provider Care Teams.  These Care Teams include your primary Cardiologist (physician) and Advanced Practice Providers (APPs -  Physician Assistants and Nurse Practitioners) who all work together to provide you with the care you need, when you need it.  Thank you for choosing [facility] at [facility]!!

## 2018-10-21 DIAGNOSIS — H35713 Central serous chorioretinopathy, bilateral: Secondary | ICD-10-CM | POA: Diagnosis not present

## 2018-10-21 DIAGNOSIS — H2513 Age-related nuclear cataract, bilateral: Secondary | ICD-10-CM | POA: Diagnosis not present

## 2018-10-21 DIAGNOSIS — H35052 Retinal neovascularization, unspecified, left eye: Secondary | ICD-10-CM | POA: Diagnosis not present

## 2018-10-23 DIAGNOSIS — Z79899 Other long term (current) drug therapy: Secondary | ICD-10-CM | POA: Diagnosis not present

## 2018-10-23 DIAGNOSIS — H35713 Central serous chorioretinopathy, bilateral: Secondary | ICD-10-CM | POA: Diagnosis not present

## 2018-10-23 DIAGNOSIS — M3214 Glomerular disease in systemic lupus erythematosus: Secondary | ICD-10-CM | POA: Diagnosis not present

## 2018-10-23 DIAGNOSIS — H35052 Retinal neovascularization, unspecified, left eye: Secondary | ICD-10-CM | POA: Diagnosis not present

## 2018-10-23 DIAGNOSIS — H353221 Exudative age-related macular degeneration, left eye, with active choroidal neovascularization: Secondary | ICD-10-CM | POA: Diagnosis not present

## 2018-10-25 DIAGNOSIS — N185 Chronic kidney disease, stage 5: Secondary | ICD-10-CM | POA: Diagnosis not present

## 2018-10-25 DIAGNOSIS — I214 Non-ST elevation (NSTEMI) myocardial infarction: Secondary | ICD-10-CM | POA: Diagnosis not present

## 2018-10-25 DIAGNOSIS — K5904 Chronic idiopathic constipation: Secondary | ICD-10-CM | POA: Diagnosis not present

## 2018-10-25 DIAGNOSIS — Z1211 Encounter for screening for malignant neoplasm of colon: Secondary | ICD-10-CM | POA: Diagnosis not present

## 2018-10-29 DIAGNOSIS — N186 End stage renal disease: Secondary | ICD-10-CM | POA: Diagnosis not present

## 2018-10-29 DIAGNOSIS — D631 Anemia in chronic kidney disease: Secondary | ICD-10-CM | POA: Diagnosis not present

## 2018-10-30 ENCOUNTER — Other Ambulatory Visit: Payer: Self-pay | Admitting: Cardiovascular Disease

## 2018-11-05 ENCOUNTER — Telehealth: Payer: Self-pay

## 2018-11-05 NOTE — Telephone Encounter (Signed)
LMTCB if she would be willing to change Friday office visit appt with Dr. Gwenlyn Found to tomorrow, 8/5 at 9:30 AM for virtual visit.

## 2018-11-06 DIAGNOSIS — Z79899 Other long term (current) drug therapy: Secondary | ICD-10-CM | POA: Diagnosis not present

## 2018-11-06 DIAGNOSIS — E785 Hyperlipidemia, unspecified: Secondary | ICD-10-CM | POA: Diagnosis not present

## 2018-11-06 LAB — HEPATIC FUNCTION PANEL
ALT: 9 IU/L (ref 0–32)
AST: 10 IU/L (ref 0–40)
Albumin: 4.3 g/dL (ref 3.8–4.9)
Alkaline Phosphatase: 66 IU/L (ref 39–117)
Bilirubin Total: 0.2 mg/dL (ref 0.0–1.2)
Bilirubin, Direct: 0.09 mg/dL (ref 0.00–0.40)
Total Protein: 6.9 g/dL (ref 6.0–8.5)

## 2018-11-06 LAB — LIPID PANEL
Chol/HDL Ratio: 2.3 ratio (ref 0.0–4.4)
Cholesterol, Total: 141 mg/dL (ref 100–199)
HDL: 61 mg/dL (ref >39)
LDL Calculated: 66 mg/dL (ref 0–99)
Triglycerides: 72 mg/dL (ref 0–149)
VLDL Cholesterol Cal: 14 mg/dL (ref 5–40)

## 2018-11-06 LAB — BASIC METABOLIC PANEL
BUN/Creatinine Ratio: 9 (ref 9–23)
BUN: 73 mg/dL — ABNORMAL HIGH (ref 6–24)
CO2: 17 mmol/L — ABNORMAL LOW (ref 20–29)
Calcium: 8.3 mg/dL — ABNORMAL LOW (ref 8.7–10.2)
Chloride: 107 mmol/L — ABNORMAL HIGH (ref 96–106)
Creatinine, Ser: 7.8 mg/dL — ABNORMAL HIGH (ref 0.57–1.00)
GFR calc Af Amer: 6 mL/min/{1.73_m2} — ABNORMAL LOW (ref >59)
GFR calc non Af Amer: 5 mL/min/{1.73_m2} — ABNORMAL LOW (ref >59)
Glucose: 75 mg/dL (ref 65–99)
Potassium: 4.8 mmol/L (ref 3.5–5.2)
Sodium: 144 mmol/L (ref 134–144)

## 2018-11-08 ENCOUNTER — Ambulatory Visit (INDEPENDENT_AMBULATORY_CARE_PROVIDER_SITE_OTHER): Payer: Medicare Other | Admitting: Cardiovascular Disease

## 2018-11-08 ENCOUNTER — Encounter: Payer: Self-pay | Admitting: Cardiovascular Disease

## 2018-11-08 ENCOUNTER — Other Ambulatory Visit: Payer: Self-pay

## 2018-11-08 DIAGNOSIS — E782 Mixed hyperlipidemia: Secondary | ICD-10-CM

## 2018-11-08 DIAGNOSIS — I10 Essential (primary) hypertension: Secondary | ICD-10-CM

## 2018-11-08 DIAGNOSIS — I214 Non-ST elevation (NSTEMI) myocardial infarction: Secondary | ICD-10-CM | POA: Diagnosis not present

## 2018-11-08 NOTE — Assessment & Plan Note (Signed)
History of CAD status post non-STEMI 08/26/2017 with catheterization for performed by Dr. Angelena Form via the right femoral approach revealing a subtotally occluded first marginal branch which was stented using a synergy drug-eluting stent (2.25 mm x 20 mm long).  She did have an 80% fairly focal mid dominant RCA stenosis which I intervened on 12/31/2017 with a synergy drug-eluting stent.  This was done for free renal transplant planning which she has yet to have done.  She remains on dual antiplatelet therapy and denies chest pain or shortness of breath.

## 2018-11-08 NOTE — Assessment & Plan Note (Signed)
History of essential hypertension with blood pressure measured today at 134/74.  She has lost 10 pounds since I saw her and her blood pressure had been trending lower.  She was symptomatic from this.  She is saw [name] NP in the office 10/15/2018 who down titrated her hydralazine which is resulted in improvement in her blood pressure and symptoms.

## 2018-11-08 NOTE — Progress Notes (Signed)
05/08/5807 [name]   12/09[birth year]80  [id number]  Primary Physician System, Pcp Not In Primary Cardiologist: Lorretta Harp MD FACP, [place], [place], [place]  HPI:  Lindsey York is a 53 y.o.  moderately overweight divorced African-American female mother of 2 children who I last saw in the office 01/11/2018. She has a history of systemic lupus erythematosus, CKD 4 with serum creatinine is in the 5-6 range predialysis previously on the transplant list at [facility]. She also has history of treated hypertension hyperlipidemia. She currently does not work. She has never smoked. She had a non-STEMI 08/26/2017 and catheterization performed by Dr. Angelena Form via the right femoral approach revealing a subtotally occluded first marginal branch which was stented with a synergy drug-eluting stent (2.25 mm x 20 mill meters). She did have an 80% fairly focal mid dominant RCA stenosis which was not intervened on because of her renal insufficiency. She is otherwise asymptomatic but wishes to continue on the renal transplant list which cannot occur until her RCA is intervened on. I performed RCA PCI and drug-eluting stenting electively as an outpatient 12/31/2017 with a synergy drug-eluting stent via the right femoral approach.  She has done well since and remains on dual antiplatelet therapy.  I did use only 25 cc of contrast and this did not affect her renal function.  She saw [name], NP virtually 10/15/2018 with complaints of symptomatic hypotension.  She has lost 10 pounds over the last year or so.  Her hydralazine was diet down titrated and her blood pressure rose appropriately and her symptoms improved.  She denies chest pain or shortness of breath.  She still is not back on the renal transplant list however.   Current Meds  Medication Sig  . acetaminophen (TYLENOL) 325 MG tablet Take 650 mg by mouth every 6 (six) hours as needed for moderate pain.  Marland Kitchen allopurinol  (ZYLOPRIM) 100 MG tablet Take 200 mg by mouth daily.   Marland Kitchen amLODipine (NORVASC) 10 MG tablet Take 10 mg by mouth daily.  Marland Kitchen aspirin 81 MG chewable tablet Chew 1 tablet (81 mg total) by mouth daily.  . bevacizumab (AVASTIN) 400 MG/16ML SOLN Inject 1.25 mg as directed every 30 (thirty) days.  Marland Kitchen BRILINTA 90 MG TABS tablet TAKE 1 TABLET BY MOUTH TWICE DAILY  . calcitRIOL (ROCALTROL) 0.25 MCG capsule Take 0.25 mcg by mouth 3 (three) times a week. Monday, Wednesday, Friday  . carvedilol (COREG) 6.25 MG tablet Take 1 tablet (6.25 mg total) by mouth 2 (two) times daily with a meal.  . cloNIDine (CATAPRES) 0.1 MG tablet Take 1 tab at 8am, 4pm and midnight (Patient taking differently: Take 1 tab at 8am and midnight)  . ezetimibe (ZETIA) 10 MG tablet Take 1 tablet (10 mg total) by mouth daily.  . furosemide (LASIX) 80 MG tablet Take 1 tablet (80 mg total) by mouth every other day. Take 1 tablet daily for 3 days and then go back to taking 1 tablet every other day. (Patient taking differently: Take 80 mg by mouth every other day. )  . hydrALAZINE (APRESOLINE) 25 MG tablet Take 1 tablet (25 mg total) by mouth daily.  . hydrALAZINE (APRESOLINE) 50 MG tablet ON A DAILY BASIS: Take one 50 mg tablet in the morning. Take half a tablet (25 mg) in the afternoon. Take one 50 mg tablet in the evening.  . lactulose (CHRONULAC) 10 GM/15ML solution Take 30 mLs by mouth 3 (three) times daily as needed for  constipation.  Marland Kitchen oxybutynin (DITROPAN) 5 MG tablet Take 5 mg by mouth daily as needed for bladder spasms.  . predniSONE (DELTASONE) 1 MG tablet Take 1 mg by mouth daily with breakfast. Pt takes 5mg  one day and 4mg  the next day  . predniSONE (DELTASONE) 5 MG tablet Take 5 mg by mouth daily with breakfast.  . ranitidine (ZANTAC) 150 MG tablet Take 150 mg by mouth every morning.   . sevelamer carbonate (RENVELA) 800 MG tablet Take 800 mg by mouth 3 (three) times daily with meals.   . simvastatin (ZOCOR) 20 MG tablet Take 1  tablet (20 mg total) by mouth at bedtime.  . sodium bicarbonate 650 MG tablet Take 1,300 mg by mouth 2 (two) times daily.     Allergies  Allergen Reactions  . Hydroxychloroquine Hives and Rash  . Codeine     Social History   Socioeconomic History  . Marital status: Divorced    Spouse name: Not on file  . Number of children: Not on file  . Years of education: Not on file  . Highest education level: Not on file  Occupational History  . Not on file  Social Needs  . Financial resource strain: Not on file  . Food insecurity    Worry: Not on file    Inability: Not on file  . Transportation needs    Medical: Not on file    Non-medical: Not on file  Tobacco Use  . Smoking status: Never Smoker  . Smokeless tobacco: Never Used  Substance and Sexual Activity  . Alcohol use: Never    Frequency: Never  . Drug use: Never  . Sexual activity: Yes  Lifestyle  . Physical activity    Days per week: Not on file    Minutes per session: Not on file  . Stress: Not on file  Relationships  . Social Herbalist on phone: Not on file    Gets together: Not on file    Attends religious service: Not on file    Active member of club or organization: Not on file    Attends meetings of clubs or organizations: Not on file    Relationship status: Not on file  . Intimate partner violence    Fear of current or ex partner: Not on file    Emotionally abused: Not on file    Physically abused: Not on file    Forced sexual activity: Not on file  Other Topics Concern  . Not on file  Social History Narrative  . Not on file     Review of Systems: General: negative for chills, fever, night sweats or weight changes.  Cardiovascular: negative for chest pain, dyspnea on exertion, edema, orthopnea, palpitations, paroxysmal nocturnal dyspnea or shortness of breath Dermatological: negative for rash Respiratory: negative for cough or wheezing Urologic: negative for hematuria Abdominal: negative  for nausea, vomiting, diarrhea, bright red blood per rectum, melena, or hematemesis Neurologic: negative for visual changes, syncope, or dizziness All other systems reviewed and are otherwise negative except as noted above.    Blood pressure 134/74, pulse 66, temperature 97.9 F (36.6 C), height 5\' 3"  (1.6 m), weight 162 lb (73.5 kg).  General appearance: alert and no distress Neck: no adenopathy, no carotid bruit, no JVD, supple, symmetrical, trachea midline and thyroid not enlarged, symmetric, no tenderness/mass/nodules Lungs: clear to auscultation bilaterally Heart: regular rate and rhythm, S1, S2 normal, no murmur, click, rub or gallop Extremities: extremities normal, atraumatic, no cyanosis  or edema Pulses: 2+ and symmetric Skin: Skin color, texture, turgor normal. No rashes or lesions Neurologic: Alert and oriented X 3, normal strength and tone. Normal symmetric reflexes. Normal coordination and gait  EKG not performed today  ASSESSMENT AND PLAN:   NSTEMI (non-ST elevated myocardial infarction) ([place]) History of CAD status post non-STEMI 08/26/2017 with catheterization for performed by Dr. Angelena Form via the right femoral approach revealing a subtotally occluded first marginal branch which was stented using a synergy drug-eluting stent (2.25 mm x 20 mm long).  She did have an 80% fairly focal mid dominant RCA stenosis which I intervened on 12/31/2017 with a synergy drug-eluting stent.  This was done for free renal transplant planning which she has yet to have done.  She remains on dual antiplatelet therapy and denies chest pain or shortness of breath.  Hypertension History of essential hypertension with blood pressure measured today at 134/74.  She has lost 10 pounds since I saw her and her blood pressure had been trending lower.  She was symptomatic from this.  She is saw [name] NP in the office 10/15/2018 who down titrated her hydralazine which is resulted in improvement in her  blood pressure and symptoms.  Hyperlipidemia History of hyperlipidemia on statin therapy with recent lipid profile performed 11/06/2018 revealing a LDL of 66 and HDL of 61.      Lorretta Harp MD FACP,FACC,FAHA, [facility] 11/08/2018 11:14 AM

## 2018-11-08 NOTE — Assessment & Plan Note (Signed)
History of hyperlipidemia on statin therapy with recent lipid profile performed 11/06/2018 revealing a LDL of 66 and HDL of 61.

## 2018-11-08 NOTE — Patient Instructions (Signed)
Medication Instructions:  Your physician recommends that you continue on your current medications as directed. Please refer to the Current Medication list given to you today.  If you need a refill on your cardiac medications before your next appointment, please call your pharmacy.   Lab work: NONE If you have labs (blood work) drawn today and your tests are completely normal, you will receive your results only by: Marland Kitchen MyChart Message (if you have MyChart) OR . A paper copy in the mail If you have any lab test that is abnormal or we need to change your treatment, we will call you to review the results.  Testing/Procedures: NONE  Follow-Up: At [facility], you and your health needs are our priority.  As part of our continuing mission to provide you with exceptional heart care, we have created designated Provider Care Teams.  These Care Teams include your primary Cardiologist (physician) and Advanced Practice Providers (APPs -  Physician Assistants and Nurse Practitioners) who all work together to provide you with the care you need, when you need it. You will need a follow up appointment in 6 months [place], DNP AND IN 12 MONTHS WITH DR. Gwenlyn Found. Please call our office 2 months in advance to schedule this appointment.

## 2018-11-12 DIAGNOSIS — N185 Chronic kidney disease, stage 5: Secondary | ICD-10-CM | POA: Diagnosis not present

## 2018-11-12 DIAGNOSIS — D631 Anemia in chronic kidney disease: Secondary | ICD-10-CM | POA: Diagnosis not present

## 2018-11-19 DIAGNOSIS — M722 Plantar fascial fibromatosis: Secondary | ICD-10-CM | POA: Diagnosis not present

## 2018-11-26 DIAGNOSIS — N185 Chronic kidney disease, stage 5: Secondary | ICD-10-CM | POA: Diagnosis not present

## 2018-11-26 DIAGNOSIS — D631 Anemia in chronic kidney disease: Secondary | ICD-10-CM | POA: Diagnosis not present

## 2018-11-27 DIAGNOSIS — H35713 Central serous chorioretinopathy, bilateral: Secondary | ICD-10-CM | POA: Diagnosis not present

## 2018-11-27 DIAGNOSIS — H2513 Age-related nuclear cataract, bilateral: Secondary | ICD-10-CM | POA: Diagnosis not present

## 2018-11-27 DIAGNOSIS — H35052 Retinal neovascularization, unspecified, left eye: Secondary | ICD-10-CM | POA: Diagnosis not present

## 2018-11-27 DIAGNOSIS — H35361 Drusen (degenerative) of macula, right eye: Secondary | ICD-10-CM | POA: Diagnosis not present

## 2018-11-27 DIAGNOSIS — H3554 Dystrophies primarily involving the retinal pigment epithelium: Secondary | ICD-10-CM | POA: Diagnosis not present

## 2018-12-06 DIAGNOSIS — N189 Chronic kidney disease, unspecified: Secondary | ICD-10-CM | POA: Diagnosis not present

## 2018-12-11 DIAGNOSIS — N185 Chronic kidney disease, stage 5: Secondary | ICD-10-CM | POA: Diagnosis not present

## 2018-12-11 DIAGNOSIS — D631 Anemia in chronic kidney disease: Secondary | ICD-10-CM | POA: Diagnosis not present

## 2018-12-17 DIAGNOSIS — B078 Other viral warts: Secondary | ICD-10-CM | POA: Diagnosis not present

## 2018-12-17 DIAGNOSIS — L821 Other seborrheic keratosis: Secondary | ICD-10-CM | POA: Diagnosis not present

## 2018-12-17 DIAGNOSIS — L820 Inflamed seborrheic keratosis: Secondary | ICD-10-CM | POA: Diagnosis not present

## 2018-12-17 DIAGNOSIS — L814 Other melanin hyperpigmentation: Secondary | ICD-10-CM | POA: Diagnosis not present

## 2018-12-17 DIAGNOSIS — D225 Melanocytic nevi of trunk: Secondary | ICD-10-CM | POA: Diagnosis not present

## 2018-12-19 DIAGNOSIS — H35713 Central serous chorioretinopathy, bilateral: Secondary | ICD-10-CM | POA: Diagnosis not present

## 2018-12-19 DIAGNOSIS — H35052 Retinal neovascularization, unspecified, left eye: Secondary | ICD-10-CM | POA: Diagnosis not present

## 2018-12-23 DIAGNOSIS — H35713 Central serous chorioretinopathy, bilateral: Secondary | ICD-10-CM | POA: Diagnosis not present

## 2018-12-23 DIAGNOSIS — Z79899 Other long term (current) drug therapy: Secondary | ICD-10-CM | POA: Diagnosis not present

## 2018-12-23 DIAGNOSIS — H35052 Retinal neovascularization, unspecified, left eye: Secondary | ICD-10-CM | POA: Diagnosis not present

## 2018-12-23 DIAGNOSIS — H2513 Age-related nuclear cataract, bilateral: Secondary | ICD-10-CM | POA: Diagnosis not present

## 2018-12-23 DIAGNOSIS — H353221 Exudative age-related macular degeneration, left eye, with active choroidal neovascularization: Secondary | ICD-10-CM | POA: Diagnosis not present

## 2018-12-25 DIAGNOSIS — D631 Anemia in chronic kidney disease: Secondary | ICD-10-CM | POA: Diagnosis not present

## 2018-12-25 DIAGNOSIS — N185 Chronic kidney disease, stage 5: Secondary | ICD-10-CM | POA: Diagnosis not present

## 2018-12-31 DIAGNOSIS — Z872 Personal history of diseases of the skin and subcutaneous tissue: Secondary | ICD-10-CM | POA: Diagnosis not present

## 2018-12-31 DIAGNOSIS — L988 Other specified disorders of the skin and subcutaneous tissue: Secondary | ICD-10-CM | POA: Diagnosis not present

## 2018-12-31 DIAGNOSIS — Z09 Encounter for follow-up examination after completed treatment for conditions other than malignant neoplasm: Secondary | ICD-10-CM | POA: Diagnosis not present

## 2019-01-09 DIAGNOSIS — N185 Chronic kidney disease, stage 5: Secondary | ICD-10-CM | POA: Diagnosis not present

## 2019-01-09 DIAGNOSIS — D631 Anemia in chronic kidney disease: Secondary | ICD-10-CM | POA: Diagnosis not present

## 2019-01-15 DIAGNOSIS — M1A9XX0 Chronic gout, unspecified, without tophus (tophi): Secondary | ICD-10-CM | POA: Diagnosis not present

## 2019-01-15 DIAGNOSIS — M7711 Lateral epicondylitis, right elbow: Secondary | ICD-10-CM | POA: Diagnosis not present

## 2019-01-15 DIAGNOSIS — M3214 Glomerular disease in systemic lupus erythematosus: Secondary | ICD-10-CM | POA: Diagnosis not present

## 2019-01-15 DIAGNOSIS — Z7952 Long term (current) use of systemic steroids: Secondary | ICD-10-CM | POA: Diagnosis not present

## 2019-01-15 DIAGNOSIS — M109 Gout, unspecified: Secondary | ICD-10-CM | POA: Diagnosis not present

## 2019-01-15 DIAGNOSIS — M8580 Other specified disorders of bone density and structure, unspecified site: Secondary | ICD-10-CM | POA: Diagnosis not present

## 2019-01-15 DIAGNOSIS — Z79899 Other long term (current) drug therapy: Secondary | ICD-10-CM | POA: Diagnosis not present

## 2019-01-17 ENCOUNTER — Telehealth: Payer: Self-pay | Admitting: Cardiovascular Disease

## 2019-01-17 ENCOUNTER — Other Ambulatory Visit: Payer: Self-pay

## 2019-01-17 MED ORDER — CARVEDILOL 6.25 MG PO TABS: 6.2500 mg | ORAL_TABLET | Freq: Two times a day (BID) | ORAL | 3 refills | Status: DC

## 2019-01-17 NOTE — Telephone Encounter (Signed)
°*  STAT* If patient is at the pharmacy, call can be transferred to refill team.   1. Which medications need to be refilled? (please list name of each medication and dose if known) carvedilol (COREG) 6.25 MG tablet  2. Which pharmacy/location (including street and city if local pharmacy) is medication to be sent to? [id number] - [place], [place] - [address]  3. Do they need a 30 day or 90 day supply? 30 day   Patient is out of medication

## 2019-01-21 DIAGNOSIS — B078 Other viral warts: Secondary | ICD-10-CM | POA: Diagnosis not present

## 2019-01-21 DIAGNOSIS — L820 Inflamed seborrheic keratosis: Secondary | ICD-10-CM | POA: Diagnosis not present

## 2019-01-28 DIAGNOSIS — Z23 Encounter for immunization: Secondary | ICD-10-CM | POA: Diagnosis not present

## 2019-01-28 DIAGNOSIS — D631 Anemia in chronic kidney disease: Secondary | ICD-10-CM | POA: Diagnosis not present

## 2019-01-28 DIAGNOSIS — N185 Chronic kidney disease, stage 5: Secondary | ICD-10-CM | POA: Diagnosis not present

## 2019-01-31 DIAGNOSIS — M810 Age-related osteoporosis without current pathological fracture: Secondary | ICD-10-CM | POA: Diagnosis not present

## 2019-01-31 DIAGNOSIS — M8589 Other specified disorders of bone density and structure, multiple sites: Secondary | ICD-10-CM | POA: Diagnosis not present

## 2019-01-31 DIAGNOSIS — Z13820 Encounter for screening for osteoporosis: Secondary | ICD-10-CM | POA: Diagnosis not present

## 2019-02-13 DIAGNOSIS — K219 Gastro-esophageal reflux disease without esophagitis: Secondary | ICD-10-CM | POA: Diagnosis not present

## 2019-02-13 DIAGNOSIS — E559 Vitamin D deficiency, unspecified: Secondary | ICD-10-CM | POA: Diagnosis not present

## 2019-02-13 DIAGNOSIS — M329 Systemic lupus erythematosus, unspecified: Secondary | ICD-10-CM | POA: Diagnosis not present

## 2019-02-13 DIAGNOSIS — E876 Hypokalemia: Secondary | ICD-10-CM | POA: Diagnosis not present

## 2019-02-13 DIAGNOSIS — I2510 Atherosclerotic heart disease of native coronary artery without angina pectoris: Secondary | ICD-10-CM | POA: Diagnosis not present

## 2019-02-13 DIAGNOSIS — M3214 Glomerular disease in systemic lupus erythematosus: Secondary | ICD-10-CM | POA: Diagnosis not present

## 2019-02-13 DIAGNOSIS — N185 Chronic kidney disease, stage 5: Secondary | ICD-10-CM | POA: Diagnosis not present

## 2019-02-13 DIAGNOSIS — N184 Chronic kidney disease, stage 4 (severe): Secondary | ICD-10-CM | POA: Diagnosis not present

## 2019-02-13 DIAGNOSIS — D631 Anemia in chronic kidney disease: Secondary | ICD-10-CM | POA: Diagnosis not present

## 2019-02-13 DIAGNOSIS — H35712 Central serous chorioretinopathy, left eye: Secondary | ICD-10-CM | POA: Diagnosis not present

## 2019-02-13 DIAGNOSIS — I120 Hypertensive chronic kidney disease with stage 5 chronic kidney disease or end stage renal disease: Secondary | ICD-10-CM | POA: Diagnosis not present

## 2019-02-17 DIAGNOSIS — N185 Chronic kidney disease, stage 5: Secondary | ICD-10-CM | POA: Diagnosis not present

## 2019-02-24 DIAGNOSIS — D631 Anemia in chronic kidney disease: Secondary | ICD-10-CM | POA: Diagnosis not present

## 2019-02-24 DIAGNOSIS — N184 Chronic kidney disease, stage 4 (severe): Secondary | ICD-10-CM | POA: Diagnosis not present

## 2019-03-03 DIAGNOSIS — H25811 Combined forms of age-related cataract, right eye: Secondary | ICD-10-CM | POA: Diagnosis not present

## 2019-03-03 DIAGNOSIS — H2512 Age-related nuclear cataract, left eye: Secondary | ICD-10-CM | POA: Diagnosis not present

## 2019-03-03 DIAGNOSIS — H35052 Retinal neovascularization, unspecified, left eye: Secondary | ICD-10-CM | POA: Diagnosis not present

## 2019-03-03 DIAGNOSIS — H35713 Central serous chorioretinopathy, bilateral: Secondary | ICD-10-CM | POA: Diagnosis not present

## 2019-03-12 DIAGNOSIS — N185 Chronic kidney disease, stage 5: Secondary | ICD-10-CM | POA: Diagnosis not present

## 2019-03-12 DIAGNOSIS — M3214 Glomerular disease in systemic lupus erythematosus: Secondary | ICD-10-CM | POA: Diagnosis not present

## 2019-03-12 DIAGNOSIS — D631 Anemia in chronic kidney disease: Secondary | ICD-10-CM | POA: Diagnosis not present

## 2019-03-17 DIAGNOSIS — N952 Postmenopausal atrophic vaginitis: Secondary | ICD-10-CM | POA: Diagnosis not present

## 2019-03-21 DIAGNOSIS — N185 Chronic kidney disease, stage 5: Secondary | ICD-10-CM | POA: Diagnosis not present

## 2019-04-07 DIAGNOSIS — H35052 Retinal neovascularization, unspecified, left eye: Secondary | ICD-10-CM | POA: Diagnosis not present

## 2019-04-07 DIAGNOSIS — H35713 Central serous chorioretinopathy, bilateral: Secondary | ICD-10-CM | POA: Diagnosis not present

## 2019-04-07 DIAGNOSIS — H353221 Exudative age-related macular degeneration, left eye, with active choroidal neovascularization: Secondary | ICD-10-CM | POA: Diagnosis not present

## 2019-04-07 DIAGNOSIS — H2513 Age-related nuclear cataract, bilateral: Secondary | ICD-10-CM | POA: Diagnosis not present

## 2019-04-09 DIAGNOSIS — N185 Chronic kidney disease, stage 5: Secondary | ICD-10-CM | POA: Diagnosis not present

## 2019-04-09 DIAGNOSIS — D631 Anemia in chronic kidney disease: Secondary | ICD-10-CM | POA: Diagnosis not present

## 2019-04-23 DIAGNOSIS — N185 Chronic kidney disease, stage 5: Secondary | ICD-10-CM | POA: Diagnosis not present

## 2019-04-23 DIAGNOSIS — D631 Anemia in chronic kidney disease: Secondary | ICD-10-CM | POA: Diagnosis not present

## 2019-05-08 DIAGNOSIS — N185 Chronic kidney disease, stage 5: Secondary | ICD-10-CM | POA: Diagnosis not present

## 2019-05-08 DIAGNOSIS — D631 Anemia in chronic kidney disease: Secondary | ICD-10-CM | POA: Diagnosis not present

## 2019-05-12 DIAGNOSIS — H35052 Retinal neovascularization, unspecified, left eye: Secondary | ICD-10-CM | POA: Diagnosis not present

## 2019-05-12 DIAGNOSIS — H35713 Central serous chorioretinopathy, bilateral: Secondary | ICD-10-CM | POA: Diagnosis not present

## 2019-05-12 DIAGNOSIS — H25811 Combined forms of age-related cataract, right eye: Secondary | ICD-10-CM | POA: Diagnosis not present

## 2019-05-12 DIAGNOSIS — H2512 Age-related nuclear cataract, left eye: Secondary | ICD-10-CM | POA: Diagnosis not present

## 2019-05-27 DIAGNOSIS — Z955 Presence of coronary angioplasty implant and graft: Secondary | ICD-10-CM | POA: Diagnosis not present

## 2019-05-27 DIAGNOSIS — N185 Chronic kidney disease, stage 5: Secondary | ICD-10-CM | POA: Diagnosis not present

## 2019-05-27 DIAGNOSIS — D631 Anemia in chronic kidney disease: Secondary | ICD-10-CM | POA: Diagnosis not present

## 2019-05-27 DIAGNOSIS — I120 Hypertensive chronic kidney disease with stage 5 chronic kidney disease or end stage renal disease: Secondary | ICD-10-CM | POA: Diagnosis not present

## 2019-05-27 DIAGNOSIS — E876 Hypokalemia: Secondary | ICD-10-CM | POA: Diagnosis not present

## 2019-05-27 DIAGNOSIS — M3214 Glomerular disease in systemic lupus erythematosus: Secondary | ICD-10-CM | POA: Diagnosis not present

## 2019-05-27 DIAGNOSIS — H35712 Central serous chorioretinopathy, left eye: Secondary | ICD-10-CM | POA: Diagnosis not present

## 2019-05-27 DIAGNOSIS — I2510 Atherosclerotic heart disease of native coronary artery without angina pectoris: Secondary | ICD-10-CM | POA: Diagnosis not present

## 2019-05-27 DIAGNOSIS — Z79899 Other long term (current) drug therapy: Secondary | ICD-10-CM | POA: Diagnosis not present

## 2019-05-27 DIAGNOSIS — E559 Vitamin D deficiency, unspecified: Secondary | ICD-10-CM | POA: Diagnosis not present

## 2019-05-28 ENCOUNTER — Ambulatory Visit: Payer: Medicare Other | Admitting: Adult Health

## 2019-05-28 DIAGNOSIS — J029 Acute pharyngitis, unspecified: Secondary | ICD-10-CM | POA: Diagnosis not present

## 2019-05-29 DIAGNOSIS — I959 Hypotension, unspecified: Secondary | ICD-10-CM | POA: Diagnosis not present

## 2019-05-29 DIAGNOSIS — R252 Cramp and spasm: Secondary | ICD-10-CM | POA: Diagnosis not present

## 2019-05-29 DIAGNOSIS — N186 End stage renal disease: Secondary | ICD-10-CM | POA: Diagnosis not present

## 2019-05-29 DIAGNOSIS — D509 Iron deficiency anemia, unspecified: Secondary | ICD-10-CM | POA: Diagnosis not present

## 2019-05-31 DIAGNOSIS — I959 Hypotension, unspecified: Secondary | ICD-10-CM | POA: Diagnosis not present

## 2019-05-31 DIAGNOSIS — N186 End stage renal disease: Secondary | ICD-10-CM | POA: Diagnosis not present

## 2019-05-31 DIAGNOSIS — R252 Cramp and spasm: Secondary | ICD-10-CM | POA: Diagnosis not present

## 2019-05-31 DIAGNOSIS — D509 Iron deficiency anemia, unspecified: Secondary | ICD-10-CM | POA: Diagnosis not present

## 2019-06-01 DIAGNOSIS — Z992 Dependence on renal dialysis: Secondary | ICD-10-CM | POA: Diagnosis not present

## 2019-06-01 DIAGNOSIS — N186 End stage renal disease: Secondary | ICD-10-CM | POA: Diagnosis not present

## 2019-06-03 DIAGNOSIS — N2581 Secondary hyperparathyroidism of renal origin: Secondary | ICD-10-CM | POA: Diagnosis not present

## 2019-06-03 DIAGNOSIS — N186 End stage renal disease: Secondary | ICD-10-CM | POA: Diagnosis not present

## 2019-06-03 DIAGNOSIS — D631 Anemia in chronic kidney disease: Secondary | ICD-10-CM | POA: Diagnosis not present

## 2019-06-05 DIAGNOSIS — D631 Anemia in chronic kidney disease: Secondary | ICD-10-CM | POA: Diagnosis not present

## 2019-06-05 DIAGNOSIS — N2581 Secondary hyperparathyroidism of renal origin: Secondary | ICD-10-CM | POA: Diagnosis not present

## 2019-06-05 DIAGNOSIS — M3214 Glomerular disease in systemic lupus erythematosus: Secondary | ICD-10-CM | POA: Diagnosis not present

## 2019-06-05 DIAGNOSIS — N186 End stage renal disease: Secondary | ICD-10-CM | POA: Diagnosis not present

## 2019-06-05 DIAGNOSIS — M546 Pain in thoracic spine: Secondary | ICD-10-CM | POA: Diagnosis not present

## 2019-06-05 DIAGNOSIS — M109 Gout, unspecified: Secondary | ICD-10-CM | POA: Diagnosis not present

## 2019-06-05 DIAGNOSIS — N185 Chronic kidney disease, stage 5: Secondary | ICD-10-CM | POA: Diagnosis not present

## 2019-06-07 DIAGNOSIS — N2581 Secondary hyperparathyroidism of renal origin: Secondary | ICD-10-CM | POA: Diagnosis not present

## 2019-06-07 DIAGNOSIS — D631 Anemia in chronic kidney disease: Secondary | ICD-10-CM | POA: Diagnosis not present

## 2019-06-07 DIAGNOSIS — N186 End stage renal disease: Secondary | ICD-10-CM | POA: Diagnosis not present

## 2019-06-09 DIAGNOSIS — H25811 Combined forms of age-related cataract, right eye: Secondary | ICD-10-CM | POA: Diagnosis not present

## 2019-06-09 DIAGNOSIS — H2512 Age-related nuclear cataract, left eye: Secondary | ICD-10-CM | POA: Diagnosis not present

## 2019-06-09 DIAGNOSIS — H353221 Exudative age-related macular degeneration, left eye, with active choroidal neovascularization: Secondary | ICD-10-CM | POA: Diagnosis not present

## 2019-06-09 DIAGNOSIS — H3554 Dystrophies primarily involving the retinal pigment epithelium: Secondary | ICD-10-CM | POA: Diagnosis not present

## 2019-06-09 DIAGNOSIS — H43821 Vitreomacular adhesion, right eye: Secondary | ICD-10-CM | POA: Diagnosis not present

## 2019-06-09 DIAGNOSIS — H35713 Central serous chorioretinopathy, bilateral: Secondary | ICD-10-CM | POA: Diagnosis not present

## 2019-06-09 DIAGNOSIS — H35052 Retinal neovascularization, unspecified, left eye: Secondary | ICD-10-CM | POA: Diagnosis not present

## 2019-06-09 NOTE — Progress Notes (Signed)
Cardiology Office Note   Date:  05/29/3333   ID:  Lindsey York, DOB [birth year], MRN [id number]  PCP:  System, Pcp Not In  Cardiologist:  Dr.Berry  CC:  Follow Up    History of Present Illness: Lindsey York is a 54 y.o. female who presents for ongoing assessment and management of hypertension  HL and CAD She had a non-STEMI 08/26/2017 and catheterization performed by Dr. Angelena Form via the right femoral approach revealing a subtotally occluded first marginal branch which was stented with a Synergy drug-eluting stent (2.25 mm x 20 mill meters). She did have an 80% fairly focal mid dominant RCA stenosis which was not intervened on because of her renal insufficiency. She also had a her RCA is intervened on. performed RCA PCI and drug-eluting stenting electively as an outpatient 12/31/2017 with a Synergy drug-eluting stent via the right femoral approach  She was last seen in the office on 11/08/2018 by Dr. Gwenlyn Found did not make any changes or planned testing. BP was well controlled.   She comes today with multiple questions.  She has recently been started on dialysis through nephrology.  She has stopped taking her Lasix.  She has lost 10 pounds and is finding that she is dizzy and lightheaded when standing.  She continues to be medically compliant with her Brilinta.  She denies any excessive bleeding or bruising.  She is hoping to be put on the kidney transplant list concerning her kidney disease.  Past Medical History:  Diagnosis Date  . Anemia   . CKD (chronic kidney disease), stage V ([place])    "followed at [facility]" (08/28/2017)  . Coronary artery disease   . Diarrhea 09/2017  . GERD (gastroesophageal reflux disease)   . High cholesterol   . History of gout    "on daily RX"" (08/28/2017)  . Hypertension   . NSTEMI (non-ST elevated myocardial infarction) ([place]) 08/26/2017  . Systemic lupus ([place])     Past Surgical History:  Procedure Laterality Date  . BREAST SURGERY Left ~ 1978   "tumors taken  out"  . CORONARY ANGIOGRAPHY N/A 12/31/2017   Procedure: CORONARY ANGIOGRAPHY;  Surgeon: Lorretta Harp, MD;  Location: [place] [facility];  Service: Cardiovascular;  Laterality: N/A;  . CORONARY STENT INTERVENTION N/A 08/28/2017   Procedure: CORONARY STENT INTERVENTION;  Surgeon: Burnell Blanks, MD;  Location: [place] [facility];  Service: Cardiovascular;  Laterality: N/A;  . CORONARY STENT INTERVENTION  12/31/2017  . CORONARY STENT INTERVENTION N/A 12/31/2017   Procedure: CORONARY STENT INTERVENTION;  Surgeon: Lorretta Harp, MD;  Location: [place] [facility];  Service: Cardiovascular;  Laterality: N/A;  . HERNIA REPAIR    . LAPAROSCOPIC CHOLECYSTECTOMY    . LEFT HEART CATH AND CORONARY ANGIOGRAPHY N/A 08/28/2017   Procedure: LEFT HEART CATH AND CORONARY ANGIOGRAPHY;  Surgeon: Burnell Blanks, MD;  Location: [place] [facility];  Service: Cardiovascular;  Laterality: N/A;  . MOUTH SURGERY     oral cavity biopsy  . TUBAL LIGATION    . UMBILICAL HERNIA REPAIR       Current Outpatient Medications  Medication Sig Dispense Refill  . acetaminophen (TYLENOL) 325 MG tablet Take 650 mg by mouth every 6 (six) hours as needed for moderate pain.    Marland Kitchen amLODipine (NORVASC) 10 MG tablet Take 10 mg by mouth daily.    Marland Kitchen aspirin 81 MG chewable tablet Chew 1 tablet (81 mg total) by mouth daily. 30 tablet 0  . bevacizumab (AVASTIN) 400 MG/16ML SOLN  Inject 1.25 mg as directed every 30 (thirty) days.    . carvedilol (COREG) 6.25 MG tablet Take 1 tablet (6.25 mg total) by mouth 2 (two) times daily with a meal. 180 tablet 3  . cloNIDine (CATAPRES) 0.1 MG tablet Take 1 tab at 8am, 4pm and midnight (Patient taking differently: Take 1 tab at 8am and midnight) 90 tablet 0  . ezetimibe (ZETIA) 10 MG tablet Take 1 tablet (10 mg total) by mouth daily. 90 tablet 3  . furosemide (LASIX) 80 MG tablet Take 1 tablet (80 mg total) by mouth every other day. Take 1 tablet daily for 3 days and then go back  to taking 1 tablet every other day. (Patient taking differently: Take 80 mg by mouth every other day. ) 30 tablet 0  . hydrALAZINE (APRESOLINE) 50 MG tablet Take 50 mg by mouth in the morning and at bedtime.    Marland Kitchen lactulose (CHRONULAC) 10 GM/15ML solution Take 30 mLs by mouth 3 (three) times daily as needed for constipation.  4  . nitroGLYCERIN (NITROSTAT) 0.4 MG SL tablet Place 1 tablet (0.4 mg total) under the tongue every 5 (five) minutes as needed for chest pain. 25 tablet 11  . oxybutynin (DITROPAN) 5 MG tablet Take 5 mg by mouth daily as needed for bladder spasms.    . predniSONE (DELTASONE) 1 MG tablet Take 1 mg by mouth daily with breakfast. Pt takes 5mg  one day and 4mg  the next day    . predniSONE (DELTASONE) 5 MG tablet Take 5 mg by mouth daily with breakfast.    . ranitidine (ZANTAC) 150 MG tablet Take 150 mg by mouth every morning.     . sevelamer carbonate (RENVELA) 800 MG tablet Take 800 mg by mouth 3 (three) times daily with meals.     . simvastatin (ZOCOR) 20 MG tablet Take 1 tablet (20 mg total) by mouth at bedtime. 90 tablet 3  . sodium bicarbonate 650 MG tablet Take 1,300 mg by mouth 2 (two) times daily.    . ticagrelor (BRILINTA) 60 MG TABS tablet Take 1 tablet (60 mg total) by mouth 2 (two) times daily. 60 tablet 6   No current facility-administered medications for this visit.    Allergies:   Hydroxychloroquine and Codeine    Social History:  The patient  reports that she has never smoked. She has never used smokeless tobacco. She reports that she does not drink alcohol or use drugs.   Family History:  The patient's family history is not on file.    ROS: All other systems are reviewed and negative. Unless otherwise mentioned in H&P    PHYSICAL EXAM: VS:  BP 106/70   Pulse 75   Ht 5\' 3"  (1.6 m)   Wt 152 lb 3.2 oz (69 kg)   SpO2 99%   BMI 26.96 kg/m  , BMI Body mass index is 26.96 kg/m. GEN: Well nourished, well developed, in no acute distress HEENT:  normal Neck: no JVD, carotid bruits, or masses Cardiac: RRR; no murmurs, rubs, or gallops,no edema  Respiratory:  Clear to auscultation bilaterally, normal work of breathing GI: soft, nontender, nondistended, + BS MS: no deformity or atrophy Skin: warm and dry, no rash Neuro:  Strength and sensation are intact Psych: euthymic mood, full affect   EKG:  NSR Low voltage QRS. T-wave flattening across the precordial leads.    Recent Labs: 11/06/2018: ALT 9; BUN 73; Creatinine, Ser 7.80; Potassium 4.8; Sodium 144    Lipid Panel  Component Value Date/Time   CHOL 141 11/06/2018 0910   TRIG 72 11/06/2018 0910   HDL 61 11/06/2018 0910   CHOLHDL 2.3 11/06/2018 0910   CHOLHDL 3.8 08/30/2017 0227   VLDL 22 08/30/2017 0227   LDLCALC 66 11/06/2018 0910      Wt Readings from Last 3 Encounters:  06/11/19 152 lb 3.2 oz (69 kg)  11/08/18 162 lb (73.5 kg)  10/15/18 164 lb (74.4 kg)      Other studies Reviewed: LHC 08/28/2017  Prox RCA lesion is 30% stenosed.  Mid RCA lesion is 80% stenosed.  Ost 1st Diag to 1st Diag lesion is 40% stenosed.  Mid LAD lesion is 30% stenosed.  Ost 2nd Mrg lesion is 99% stenosed.  A drug-eluting stent was successfully placed using a STENT SYNERGY DES 2.25X20.  Post intervention, there is a 0% residual stenosis.   1. NSTEMI secondary to severe stenosis in the second obtuse marginal branch 2. Successful PTCA/DES x 1 second OM branch 3. Severe stenosis in the mid segment of the large dominant RCA. This is not critical but will need to be addressed with PCI later this week.  4. Mild non-obstructive disease in the LAD and Diagonal  RCA Intervention 12/31/2017   Mid RCA lesion is 80% stenosed.  Previously placed Ost 2nd Mrg to 2nd Mrg stent (unknown type) is widely patent.  A drug-eluting stent was successfully placed.  Post intervention, there is a 0% residual stenosis.  ASSESSMENT AND PLAN:  1. Hypertension: Blood pressures soft today.  She  is having some complaints of dizziness with position change.  I am going to remove the hydralazine 25 mg noontime dose.  She is to continue to take her blood pressure and monitor.  She is to report any blood pressures less than 100/50 at which time would likely decrease her hydralazine to 25 mg twice daily.  She will continue on carvedilol 6.25 mg twice daily along with clonidine 0.1 mg 3 times daily.  Of course as she is seeing her nephrologist regularly during dialysis medications may be adjusted sooner.  2.  Coronary artery disease: History of non-ST elevation MI on 08/26/2017 with a subtotally occluded first marginal branch stented with synergy drug-eluting stent.  She did have 80% focal dominant RCA stenosis at which time she had RCA PCI performed on 12/31/2017.  She is currently without any complaints of chest pain.  Continue secondary prevention.  Will decrease her Brilinta from 90 mg twice daily to 60 mg twice daily.  3. Hyperlipidemia: She will continue Zetia 10 mg daily and simvastatin 20 mg daily.  Goal of LDL less than 70.  Most recent LDL dated 11/06/2018 was 66.  Continue current regimen.  4.  End-stage renal disease: She is to follow-up with nephrologist concerning taking furosemide or not.  She has not been taking it at this time.  Will defer to nephrology for that decision.   Current medicines are reviewed at length with the patient today.  I have spent 25 minutes  dedicated to the care of this patient on the date of this encounter to include pre-visit review of records, assessment, management and diagnostic testing,with shared decision making.  Labs/ tests ordered today include: None. Followed by Nephrology and PCP  Phill Myron. [place] Pugh, ANP, AACC   06/11/2019 3:33 PM    [facility] [place] [address] Office [phone] Fax [phone]  Notice: This dictation was prepared with Dragon dictation along with smaller phrase technology. Any  transcriptional errors that result from this process are unintentional and may not be corrected upon review.

## 2019-06-10 DIAGNOSIS — D631 Anemia in chronic kidney disease: Secondary | ICD-10-CM | POA: Diagnosis not present

## 2019-06-10 DIAGNOSIS — N186 End stage renal disease: Secondary | ICD-10-CM | POA: Diagnosis not present

## 2019-06-10 DIAGNOSIS — N2581 Secondary hyperparathyroidism of renal origin: Secondary | ICD-10-CM | POA: Diagnosis not present

## 2019-06-11 ENCOUNTER — Other Ambulatory Visit: Payer: Self-pay

## 2019-06-11 ENCOUNTER — Encounter: Payer: Self-pay | Admitting: Adult Health

## 2019-06-11 ENCOUNTER — Ambulatory Visit: Payer: Medicare Other | Admitting: Adult Health

## 2019-06-11 VITALS — BP 106/70 | HR 75 | Ht 63.0 in | Wt 152.2 lb

## 2019-06-11 DIAGNOSIS — I10 Essential (primary) hypertension: Secondary | ICD-10-CM | POA: Diagnosis not present

## 2019-06-11 DIAGNOSIS — I2510 Atherosclerotic heart disease of native coronary artery without angina pectoris: Secondary | ICD-10-CM

## 2019-06-11 DIAGNOSIS — E785 Hyperlipidemia, unspecified: Secondary | ICD-10-CM

## 2019-06-11 DIAGNOSIS — N185 Chronic kidney disease, stage 5: Secondary | ICD-10-CM | POA: Diagnosis not present

## 2019-06-11 MED ORDER — TICAGRELOR 60 MG PO TABS: 60.0000 mg | ORAL_TABLET | Freq: Two times a day (BID) | ORAL | 6 refills | Status: DC

## 2019-06-11 NOTE — Patient Instructions (Signed)
Medication Instructions:  DECREASE- Hydralazine 50 mg by mouth twice a day DECREASE- Brilinta 60 mg by mouth twice a day  *If you need a refill on your cardiac medications before your next appointment, please call your pharmacy*   Lab Work: None Ordered   Testing/Procedures: None Ordered   Follow-Up: At [facility], you and your health needs are our priority.  As part of our continuing mission to provide you with exceptional heart care, we have created designated Provider Care Teams.  These Care Teams include your primary Cardiologist (physician) and Advanced Practice Providers (APPs -  Physician Assistants and Nurse Practitioners) who all work together to provide you with the care you need, when you need it.  We recommend signing up for the patient portal called "MyChart".  Sign up information is provided on this After Visit Summary.  MyChart is used to connect with patients for Virtual Visits (Telemedicine).  Patients are able to view lab/test results, encounter notes, upcoming appointments, etc.  Non-urgent messages can be sent to your provider as well.   To learn more about what you can do with MyChart, go to [email or web address].    Your next appointment:   6 month(s)  The format for your next appointment:   In Person  Provider:   You may see Quay Burow, MD or one of the following Advanced Practice Providers on your designated Care Team:    Kerin Ransom, PA-C  [place], [place]  Coletta Memos, [place]

## 2019-06-12 DIAGNOSIS — N186 End stage renal disease: Secondary | ICD-10-CM | POA: Diagnosis not present

## 2019-06-12 DIAGNOSIS — D631 Anemia in chronic kidney disease: Secondary | ICD-10-CM | POA: Diagnosis not present

## 2019-06-14 DIAGNOSIS — D631 Anemia in chronic kidney disease: Secondary | ICD-10-CM | POA: Diagnosis not present

## 2019-06-14 DIAGNOSIS — N186 End stage renal disease: Secondary | ICD-10-CM | POA: Diagnosis not present

## 2019-06-16 DIAGNOSIS — D631 Anemia in chronic kidney disease: Secondary | ICD-10-CM | POA: Diagnosis not present

## 2019-06-16 DIAGNOSIS — N186 End stage renal disease: Secondary | ICD-10-CM | POA: Diagnosis not present

## 2019-06-19 DIAGNOSIS — N186 End stage renal disease: Secondary | ICD-10-CM | POA: Diagnosis not present

## 2019-06-19 DIAGNOSIS — D631 Anemia in chronic kidney disease: Secondary | ICD-10-CM | POA: Diagnosis not present

## 2019-06-21 DIAGNOSIS — N186 End stage renal disease: Secondary | ICD-10-CM | POA: Diagnosis not present

## 2019-06-21 DIAGNOSIS — D631 Anemia in chronic kidney disease: Secondary | ICD-10-CM | POA: Diagnosis not present

## 2019-06-24 DIAGNOSIS — N186 End stage renal disease: Secondary | ICD-10-CM | POA: Diagnosis not present

## 2019-06-24 DIAGNOSIS — D631 Anemia in chronic kidney disease: Secondary | ICD-10-CM | POA: Diagnosis not present

## 2019-06-26 DIAGNOSIS — N186 End stage renal disease: Secondary | ICD-10-CM | POA: Diagnosis not present

## 2019-06-26 DIAGNOSIS — D631 Anemia in chronic kidney disease: Secondary | ICD-10-CM | POA: Diagnosis not present

## 2019-06-28 DIAGNOSIS — N186 End stage renal disease: Secondary | ICD-10-CM | POA: Diagnosis not present

## 2019-06-28 DIAGNOSIS — D631 Anemia in chronic kidney disease: Secondary | ICD-10-CM | POA: Diagnosis not present

## 2019-07-01 DIAGNOSIS — N186 End stage renal disease: Secondary | ICD-10-CM | POA: Diagnosis not present

## 2019-07-01 DIAGNOSIS — D631 Anemia in chronic kidney disease: Secondary | ICD-10-CM | POA: Diagnosis not present

## 2019-07-02 DIAGNOSIS — N186 End stage renal disease: Secondary | ICD-10-CM | POA: Diagnosis not present

## 2019-07-02 DIAGNOSIS — Z992 Dependence on renal dialysis: Secondary | ICD-10-CM | POA: Diagnosis not present

## 2019-07-03 DIAGNOSIS — E785 Hyperlipidemia, unspecified: Secondary | ICD-10-CM | POA: Diagnosis not present

## 2019-07-03 DIAGNOSIS — D509 Iron deficiency anemia, unspecified: Secondary | ICD-10-CM | POA: Diagnosis not present

## 2019-07-03 DIAGNOSIS — N2581 Secondary hyperparathyroidism of renal origin: Secondary | ICD-10-CM | POA: Diagnosis not present

## 2019-07-03 DIAGNOSIS — N186 End stage renal disease: Secondary | ICD-10-CM | POA: Diagnosis not present

## 2019-07-03 DIAGNOSIS — D631 Anemia in chronic kidney disease: Secondary | ICD-10-CM | POA: Diagnosis not present

## 2019-07-05 DIAGNOSIS — N186 End stage renal disease: Secondary | ICD-10-CM | POA: Diagnosis not present

## 2019-07-05 DIAGNOSIS — D509 Iron deficiency anemia, unspecified: Secondary | ICD-10-CM | POA: Diagnosis not present

## 2019-07-05 DIAGNOSIS — E785 Hyperlipidemia, unspecified: Secondary | ICD-10-CM | POA: Diagnosis not present

## 2019-07-05 DIAGNOSIS — N2581 Secondary hyperparathyroidism of renal origin: Secondary | ICD-10-CM | POA: Diagnosis not present

## 2019-07-05 DIAGNOSIS — D631 Anemia in chronic kidney disease: Secondary | ICD-10-CM | POA: Diagnosis not present

## 2019-07-08 DIAGNOSIS — D631 Anemia in chronic kidney disease: Secondary | ICD-10-CM | POA: Diagnosis not present

## 2019-07-08 DIAGNOSIS — N186 End stage renal disease: Secondary | ICD-10-CM | POA: Diagnosis not present

## 2019-07-08 DIAGNOSIS — D509 Iron deficiency anemia, unspecified: Secondary | ICD-10-CM | POA: Diagnosis not present

## 2019-07-08 DIAGNOSIS — N2581 Secondary hyperparathyroidism of renal origin: Secondary | ICD-10-CM | POA: Diagnosis not present

## 2019-07-08 DIAGNOSIS — E785 Hyperlipidemia, unspecified: Secondary | ICD-10-CM | POA: Diagnosis not present

## 2019-07-10 DIAGNOSIS — E785 Hyperlipidemia, unspecified: Secondary | ICD-10-CM | POA: Diagnosis not present

## 2019-07-10 DIAGNOSIS — D509 Iron deficiency anemia, unspecified: Secondary | ICD-10-CM | POA: Diagnosis not present

## 2019-07-10 DIAGNOSIS — N186 End stage renal disease: Secondary | ICD-10-CM | POA: Diagnosis not present

## 2019-07-10 DIAGNOSIS — D631 Anemia in chronic kidney disease: Secondary | ICD-10-CM | POA: Diagnosis not present

## 2019-07-10 DIAGNOSIS — N2581 Secondary hyperparathyroidism of renal origin: Secondary | ICD-10-CM | POA: Diagnosis not present

## 2019-07-10 DIAGNOSIS — M109 Gout, unspecified: Secondary | ICD-10-CM | POA: Diagnosis not present

## 2019-07-12 DIAGNOSIS — E785 Hyperlipidemia, unspecified: Secondary | ICD-10-CM | POA: Diagnosis not present

## 2019-07-12 DIAGNOSIS — D631 Anemia in chronic kidney disease: Secondary | ICD-10-CM | POA: Diagnosis not present

## 2019-07-12 DIAGNOSIS — D509 Iron deficiency anemia, unspecified: Secondary | ICD-10-CM | POA: Diagnosis not present

## 2019-07-12 DIAGNOSIS — N2581 Secondary hyperparathyroidism of renal origin: Secondary | ICD-10-CM | POA: Diagnosis not present

## 2019-07-12 DIAGNOSIS — N186 End stage renal disease: Secondary | ICD-10-CM | POA: Diagnosis not present

## 2019-07-15 DIAGNOSIS — N2581 Secondary hyperparathyroidism of renal origin: Secondary | ICD-10-CM | POA: Diagnosis not present

## 2019-07-15 DIAGNOSIS — D631 Anemia in chronic kidney disease: Secondary | ICD-10-CM | POA: Diagnosis not present

## 2019-07-15 DIAGNOSIS — D509 Iron deficiency anemia, unspecified: Secondary | ICD-10-CM | POA: Diagnosis not present

## 2019-07-15 DIAGNOSIS — E785 Hyperlipidemia, unspecified: Secondary | ICD-10-CM | POA: Diagnosis not present

## 2019-07-15 DIAGNOSIS — N186 End stage renal disease: Secondary | ICD-10-CM | POA: Diagnosis not present

## 2019-07-16 DIAGNOSIS — M1A9XX0 Chronic gout, unspecified, without tophus (tophi): Secondary | ICD-10-CM | POA: Diagnosis not present

## 2019-07-16 DIAGNOSIS — M3214 Glomerular disease in systemic lupus erythematosus: Secondary | ICD-10-CM | POA: Diagnosis not present

## 2019-07-16 DIAGNOSIS — Z79899 Other long term (current) drug therapy: Secondary | ICD-10-CM | POA: Diagnosis not present

## 2019-07-16 DIAGNOSIS — M810 Age-related osteoporosis without current pathological fracture: Secondary | ICD-10-CM | POA: Diagnosis not present

## 2019-07-17 DIAGNOSIS — N2581 Secondary hyperparathyroidism of renal origin: Secondary | ICD-10-CM | POA: Diagnosis not present

## 2019-07-17 DIAGNOSIS — E785 Hyperlipidemia, unspecified: Secondary | ICD-10-CM | POA: Diagnosis not present

## 2019-07-17 DIAGNOSIS — D631 Anemia in chronic kidney disease: Secondary | ICD-10-CM | POA: Diagnosis not present

## 2019-07-17 DIAGNOSIS — D509 Iron deficiency anemia, unspecified: Secondary | ICD-10-CM | POA: Diagnosis not present

## 2019-07-17 DIAGNOSIS — N186 End stage renal disease: Secondary | ICD-10-CM | POA: Diagnosis not present

## 2019-07-19 DIAGNOSIS — E785 Hyperlipidemia, unspecified: Secondary | ICD-10-CM | POA: Diagnosis not present

## 2019-07-19 DIAGNOSIS — D631 Anemia in chronic kidney disease: Secondary | ICD-10-CM | POA: Diagnosis not present

## 2019-07-19 DIAGNOSIS — N2581 Secondary hyperparathyroidism of renal origin: Secondary | ICD-10-CM | POA: Diagnosis not present

## 2019-07-19 DIAGNOSIS — D509 Iron deficiency anemia, unspecified: Secondary | ICD-10-CM | POA: Diagnosis not present

## 2019-07-19 DIAGNOSIS — N186 End stage renal disease: Secondary | ICD-10-CM | POA: Diagnosis not present

## 2019-07-21 DIAGNOSIS — H2513 Age-related nuclear cataract, bilateral: Secondary | ICD-10-CM | POA: Diagnosis not present

## 2019-07-21 DIAGNOSIS — H353221 Exudative age-related macular degeneration, left eye, with active choroidal neovascularization: Secondary | ICD-10-CM | POA: Diagnosis not present

## 2019-07-21 DIAGNOSIS — H35713 Central serous chorioretinopathy, bilateral: Secondary | ICD-10-CM | POA: Diagnosis not present

## 2019-07-22 DIAGNOSIS — D631 Anemia in chronic kidney disease: Secondary | ICD-10-CM | POA: Diagnosis not present

## 2019-07-22 DIAGNOSIS — N2581 Secondary hyperparathyroidism of renal origin: Secondary | ICD-10-CM | POA: Diagnosis not present

## 2019-07-22 DIAGNOSIS — N186 End stage renal disease: Secondary | ICD-10-CM | POA: Diagnosis not present

## 2019-07-22 DIAGNOSIS — E785 Hyperlipidemia, unspecified: Secondary | ICD-10-CM | POA: Diagnosis not present

## 2019-07-22 DIAGNOSIS — D509 Iron deficiency anemia, unspecified: Secondary | ICD-10-CM | POA: Diagnosis not present

## 2019-07-24 DIAGNOSIS — D631 Anemia in chronic kidney disease: Secondary | ICD-10-CM | POA: Diagnosis not present

## 2019-07-24 DIAGNOSIS — N2581 Secondary hyperparathyroidism of renal origin: Secondary | ICD-10-CM | POA: Diagnosis not present

## 2019-07-24 DIAGNOSIS — D509 Iron deficiency anemia, unspecified: Secondary | ICD-10-CM | POA: Diagnosis not present

## 2019-07-24 DIAGNOSIS — N186 End stage renal disease: Secondary | ICD-10-CM | POA: Diagnosis not present

## 2019-07-24 DIAGNOSIS — E785 Hyperlipidemia, unspecified: Secondary | ICD-10-CM | POA: Diagnosis not present

## 2019-07-26 DIAGNOSIS — T82838D Hemorrhage of vascular prosthetic devices, implants and grafts, subsequent encounter: Secondary | ICD-10-CM | POA: Diagnosis not present

## 2019-07-26 DIAGNOSIS — I120 Hypertensive chronic kidney disease with stage 5 chronic kidney disease or end stage renal disease: Secondary | ICD-10-CM | POA: Diagnosis not present

## 2019-07-26 DIAGNOSIS — Z992 Dependence on renal dialysis: Secondary | ICD-10-CM | POA: Diagnosis not present

## 2019-07-26 DIAGNOSIS — Y999 Unspecified external cause status: Secondary | ICD-10-CM | POA: Diagnosis not present

## 2019-07-26 DIAGNOSIS — N186 End stage renal disease: Secondary | ICD-10-CM | POA: Diagnosis not present

## 2019-07-26 DIAGNOSIS — N2581 Secondary hyperparathyroidism of renal origin: Secondary | ICD-10-CM | POA: Diagnosis not present

## 2019-07-26 DIAGNOSIS — X58XXXA Exposure to other specified factors, initial encounter: Secondary | ICD-10-CM | POA: Diagnosis not present

## 2019-07-26 DIAGNOSIS — Y848 Other medical procedures as the cause of abnormal reaction of the patient, or of later complication, without mention of misadventure at the time of the procedure: Secondary | ICD-10-CM | POA: Diagnosis not present

## 2019-07-26 DIAGNOSIS — T82838A Hemorrhage of vascular prosthetic devices, implants and grafts, initial encounter: Secondary | ICD-10-CM | POA: Diagnosis not present

## 2019-07-26 DIAGNOSIS — D509 Iron deficiency anemia, unspecified: Secondary | ICD-10-CM | POA: Diagnosis not present

## 2019-07-26 DIAGNOSIS — D631 Anemia in chronic kidney disease: Secondary | ICD-10-CM | POA: Diagnosis not present

## 2019-07-26 DIAGNOSIS — E785 Hyperlipidemia, unspecified: Secondary | ICD-10-CM | POA: Diagnosis not present

## 2019-07-27 DIAGNOSIS — Y848 Other medical procedures as the cause of abnormal reaction of the patient, or of later complication, without mention of misadventure at the time of the procedure: Secondary | ICD-10-CM | POA: Diagnosis not present

## 2019-07-27 DIAGNOSIS — Z992 Dependence on renal dialysis: Secondary | ICD-10-CM | POA: Diagnosis not present

## 2019-07-27 DIAGNOSIS — T82838D Hemorrhage of vascular prosthetic devices, implants and grafts, subsequent encounter: Secondary | ICD-10-CM | POA: Diagnosis not present

## 2019-07-27 DIAGNOSIS — N186 End stage renal disease: Secondary | ICD-10-CM | POA: Diagnosis not present

## 2019-07-28 ENCOUNTER — Telehealth: Payer: Self-pay | Admitting: Adult Health

## 2019-07-28 DIAGNOSIS — Z8719 Personal history of other diseases of the digestive system: Secondary | ICD-10-CM | POA: Diagnosis not present

## 2019-07-28 DIAGNOSIS — K219 Gastro-esophageal reflux disease without esophagitis: Secondary | ICD-10-CM | POA: Diagnosis not present

## 2019-07-28 DIAGNOSIS — K5900 Constipation, unspecified: Secondary | ICD-10-CM | POA: Diagnosis not present

## 2019-07-28 DIAGNOSIS — Z1211 Encounter for screening for malignant neoplasm of colon: Secondary | ICD-10-CM | POA: Diagnosis not present

## 2019-07-28 NOTE — Telephone Encounter (Signed)
New Message  Pt called and stated that she just started dialysis last week and she had stopped taking her 81 mg aspirin. She said she had a bleeding problem where she couldn't stop bleeding and was wondering if it had to do anything with the aspirin and if she should continue to take it

## 2019-07-28 NOTE — Telephone Encounter (Signed)
Called pt, she states she started dialysis 2 months ago and had been taking her aspirin daily. She reports that for 2 weeks she had not had her aspirin refilled and went without it. She then started back in the last 4-5 days with taking the aspirin daily. She reports that after her dialysis treatment she had bleeding that would not stop and she spoke with her Kidney doctor who thought the bleeding was a reaction of her starting back on the aspirin again. She states she went to the ED twice on Saturday to stop the bleeding. She recently saw [name] NP March 06/11/19 and her Brilinta was dropped from 90 to 60.  She reports having no issues with taking her aspirin or her medications, just wondering if something needs to be changed. She is going for a colonoscopy prescreening today. And wont be available to talk until after 1pm. Notified I would send this message to Dr.Berry, Curt Bears, and our pharmacist for advice. Pt verbalized understanding with no other questions at this time.

## 2019-07-28 NOTE — Telephone Encounter (Signed)
She may stop the Aspirin temporarily.  Especially if she was found to have polyps or other issues with the colonoscopy.  Would follow up with nephrology for labs to include CBC when she has next dialysis.   Curt Bears

## 2019-07-28 NOTE — Telephone Encounter (Signed)
Called pt with Lindsey York's recommendations. She states that after her prescreening today she wont be able to have the colonoscopy done until Lindsey York. Pt states that when she went to the ED on Saturday they did draw a CBC then (results in care everywhere). Pt is having dialysis tomorrow but they will not draw labs until 5/5 or 5/6 she said at that time she will fax the labs to our office for review and hold her aspirin until told otherwise.  Pt had no other questions at this time and verbalized understanding with all instructions. Will route to [name] to make aware.

## 2019-07-29 DIAGNOSIS — N186 End stage renal disease: Secondary | ICD-10-CM | POA: Diagnosis not present

## 2019-07-29 DIAGNOSIS — E785 Hyperlipidemia, unspecified: Secondary | ICD-10-CM | POA: Diagnosis not present

## 2019-07-29 DIAGNOSIS — D631 Anemia in chronic kidney disease: Secondary | ICD-10-CM | POA: Diagnosis not present

## 2019-07-29 DIAGNOSIS — N2581 Secondary hyperparathyroidism of renal origin: Secondary | ICD-10-CM | POA: Diagnosis not present

## 2019-07-29 DIAGNOSIS — D509 Iron deficiency anemia, unspecified: Secondary | ICD-10-CM | POA: Diagnosis not present

## 2019-07-29 NOTE — Telephone Encounter (Signed)
Thank you :)

## 2019-07-31 DIAGNOSIS — D509 Iron deficiency anemia, unspecified: Secondary | ICD-10-CM | POA: Diagnosis not present

## 2019-07-31 DIAGNOSIS — E785 Hyperlipidemia, unspecified: Secondary | ICD-10-CM | POA: Diagnosis not present

## 2019-07-31 DIAGNOSIS — N2581 Secondary hyperparathyroidism of renal origin: Secondary | ICD-10-CM | POA: Diagnosis not present

## 2019-07-31 DIAGNOSIS — N186 End stage renal disease: Secondary | ICD-10-CM | POA: Diagnosis not present

## 2019-07-31 DIAGNOSIS — D631 Anemia in chronic kidney disease: Secondary | ICD-10-CM | POA: Diagnosis not present

## 2019-08-01 DIAGNOSIS — Z992 Dependence on renal dialysis: Secondary | ICD-10-CM | POA: Diagnosis not present

## 2019-08-01 DIAGNOSIS — N186 End stage renal disease: Secondary | ICD-10-CM | POA: Diagnosis not present

## 2019-08-02 DIAGNOSIS — Z23 Encounter for immunization: Secondary | ICD-10-CM | POA: Diagnosis not present

## 2019-08-02 DIAGNOSIS — N2581 Secondary hyperparathyroidism of renal origin: Secondary | ICD-10-CM | POA: Diagnosis not present

## 2019-08-02 DIAGNOSIS — N186 End stage renal disease: Secondary | ICD-10-CM | POA: Diagnosis not present

## 2019-08-02 DIAGNOSIS — D509 Iron deficiency anemia, unspecified: Secondary | ICD-10-CM | POA: Diagnosis not present

## 2019-08-02 DIAGNOSIS — D631 Anemia in chronic kidney disease: Secondary | ICD-10-CM | POA: Diagnosis not present

## 2019-08-05 DIAGNOSIS — D509 Iron deficiency anemia, unspecified: Secondary | ICD-10-CM | POA: Diagnosis not present

## 2019-08-05 DIAGNOSIS — Z23 Encounter for immunization: Secondary | ICD-10-CM | POA: Diagnosis not present

## 2019-08-05 DIAGNOSIS — D631 Anemia in chronic kidney disease: Secondary | ICD-10-CM | POA: Diagnosis not present

## 2019-08-05 DIAGNOSIS — N2581 Secondary hyperparathyroidism of renal origin: Secondary | ICD-10-CM | POA: Diagnosis not present

## 2019-08-05 DIAGNOSIS — N186 End stage renal disease: Secondary | ICD-10-CM | POA: Diagnosis not present

## 2019-08-07 DIAGNOSIS — N186 End stage renal disease: Secondary | ICD-10-CM | POA: Diagnosis not present

## 2019-08-07 DIAGNOSIS — D631 Anemia in chronic kidney disease: Secondary | ICD-10-CM | POA: Diagnosis not present

## 2019-08-07 DIAGNOSIS — N2581 Secondary hyperparathyroidism of renal origin: Secondary | ICD-10-CM | POA: Diagnosis not present

## 2019-08-07 DIAGNOSIS — D509 Iron deficiency anemia, unspecified: Secondary | ICD-10-CM | POA: Diagnosis not present

## 2019-08-07 DIAGNOSIS — Z23 Encounter for immunization: Secondary | ICD-10-CM | POA: Diagnosis not present

## 2019-08-07 DIAGNOSIS — M109 Gout, unspecified: Secondary | ICD-10-CM | POA: Diagnosis not present

## 2019-08-09 DIAGNOSIS — Z23 Encounter for immunization: Secondary | ICD-10-CM | POA: Diagnosis not present

## 2019-08-09 DIAGNOSIS — N2581 Secondary hyperparathyroidism of renal origin: Secondary | ICD-10-CM | POA: Diagnosis not present

## 2019-08-09 DIAGNOSIS — N186 End stage renal disease: Secondary | ICD-10-CM | POA: Diagnosis not present

## 2019-08-09 DIAGNOSIS — D631 Anemia in chronic kidney disease: Secondary | ICD-10-CM | POA: Diagnosis not present

## 2019-08-09 DIAGNOSIS — D509 Iron deficiency anemia, unspecified: Secondary | ICD-10-CM | POA: Diagnosis not present

## 2019-08-12 DIAGNOSIS — D509 Iron deficiency anemia, unspecified: Secondary | ICD-10-CM | POA: Diagnosis not present

## 2019-08-12 DIAGNOSIS — D631 Anemia in chronic kidney disease: Secondary | ICD-10-CM | POA: Diagnosis not present

## 2019-08-12 DIAGNOSIS — N2581 Secondary hyperparathyroidism of renal origin: Secondary | ICD-10-CM | POA: Diagnosis not present

## 2019-08-12 DIAGNOSIS — Z23 Encounter for immunization: Secondary | ICD-10-CM | POA: Diagnosis not present

## 2019-08-12 DIAGNOSIS — N186 End stage renal disease: Secondary | ICD-10-CM | POA: Diagnosis not present

## 2019-08-14 DIAGNOSIS — D631 Anemia in chronic kidney disease: Secondary | ICD-10-CM | POA: Diagnosis not present

## 2019-08-14 DIAGNOSIS — Z23 Encounter for immunization: Secondary | ICD-10-CM | POA: Diagnosis not present

## 2019-08-14 DIAGNOSIS — N186 End stage renal disease: Secondary | ICD-10-CM | POA: Diagnosis not present

## 2019-08-14 DIAGNOSIS — N2581 Secondary hyperparathyroidism of renal origin: Secondary | ICD-10-CM | POA: Diagnosis not present

## 2019-08-14 DIAGNOSIS — D509 Iron deficiency anemia, unspecified: Secondary | ICD-10-CM | POA: Diagnosis not present

## 2019-08-16 DIAGNOSIS — D631 Anemia in chronic kidney disease: Secondary | ICD-10-CM | POA: Diagnosis not present

## 2019-08-16 DIAGNOSIS — N2581 Secondary hyperparathyroidism of renal origin: Secondary | ICD-10-CM | POA: Diagnosis not present

## 2019-08-16 DIAGNOSIS — N186 End stage renal disease: Secondary | ICD-10-CM | POA: Diagnosis not present

## 2019-08-16 DIAGNOSIS — D509 Iron deficiency anemia, unspecified: Secondary | ICD-10-CM | POA: Diagnosis not present

## 2019-08-16 DIAGNOSIS — Z23 Encounter for immunization: Secondary | ICD-10-CM | POA: Diagnosis not present

## 2019-08-18 DIAGNOSIS — H2513 Age-related nuclear cataract, bilateral: Secondary | ICD-10-CM | POA: Diagnosis not present

## 2019-08-18 DIAGNOSIS — H353221 Exudative age-related macular degeneration, left eye, with active choroidal neovascularization: Secondary | ICD-10-CM | POA: Diagnosis not present

## 2019-08-18 DIAGNOSIS — H35713 Central serous chorioretinopathy, bilateral: Secondary | ICD-10-CM | POA: Diagnosis not present

## 2019-08-18 DIAGNOSIS — H3554 Dystrophies primarily involving the retinal pigment epithelium: Secondary | ICD-10-CM | POA: Diagnosis not present

## 2019-08-19 DIAGNOSIS — N2581 Secondary hyperparathyroidism of renal origin: Secondary | ICD-10-CM | POA: Diagnosis not present

## 2019-08-19 DIAGNOSIS — N186 End stage renal disease: Secondary | ICD-10-CM | POA: Diagnosis not present

## 2019-08-19 DIAGNOSIS — D509 Iron deficiency anemia, unspecified: Secondary | ICD-10-CM | POA: Diagnosis not present

## 2019-08-19 DIAGNOSIS — Z23 Encounter for immunization: Secondary | ICD-10-CM | POA: Diagnosis not present

## 2019-08-19 DIAGNOSIS — D631 Anemia in chronic kidney disease: Secondary | ICD-10-CM | POA: Diagnosis not present

## 2019-08-21 DIAGNOSIS — N186 End stage renal disease: Secondary | ICD-10-CM | POA: Diagnosis not present

## 2019-08-21 DIAGNOSIS — Z23 Encounter for immunization: Secondary | ICD-10-CM | POA: Diagnosis not present

## 2019-08-21 DIAGNOSIS — D509 Iron deficiency anemia, unspecified: Secondary | ICD-10-CM | POA: Diagnosis not present

## 2019-08-21 DIAGNOSIS — N2581 Secondary hyperparathyroidism of renal origin: Secondary | ICD-10-CM | POA: Diagnosis not present

## 2019-08-21 DIAGNOSIS — D631 Anemia in chronic kidney disease: Secondary | ICD-10-CM | POA: Diagnosis not present

## 2019-08-23 DIAGNOSIS — D631 Anemia in chronic kidney disease: Secondary | ICD-10-CM | POA: Diagnosis not present

## 2019-08-23 DIAGNOSIS — D509 Iron deficiency anemia, unspecified: Secondary | ICD-10-CM | POA: Diagnosis not present

## 2019-08-23 DIAGNOSIS — N2581 Secondary hyperparathyroidism of renal origin: Secondary | ICD-10-CM | POA: Diagnosis not present

## 2019-08-23 DIAGNOSIS — N186 End stage renal disease: Secondary | ICD-10-CM | POA: Diagnosis not present

## 2019-08-23 DIAGNOSIS — Z23 Encounter for immunization: Secondary | ICD-10-CM | POA: Diagnosis not present

## 2019-08-26 DIAGNOSIS — D509 Iron deficiency anemia, unspecified: Secondary | ICD-10-CM | POA: Diagnosis not present

## 2019-08-26 DIAGNOSIS — N2581 Secondary hyperparathyroidism of renal origin: Secondary | ICD-10-CM | POA: Diagnosis not present

## 2019-08-26 DIAGNOSIS — Z23 Encounter for immunization: Secondary | ICD-10-CM | POA: Diagnosis not present

## 2019-08-26 DIAGNOSIS — D631 Anemia in chronic kidney disease: Secondary | ICD-10-CM | POA: Diagnosis not present

## 2019-08-26 DIAGNOSIS — N186 End stage renal disease: Secondary | ICD-10-CM | POA: Diagnosis not present

## 2019-08-28 DIAGNOSIS — Z23 Encounter for immunization: Secondary | ICD-10-CM | POA: Diagnosis not present

## 2019-08-28 DIAGNOSIS — N2581 Secondary hyperparathyroidism of renal origin: Secondary | ICD-10-CM | POA: Diagnosis not present

## 2019-08-28 DIAGNOSIS — N186 End stage renal disease: Secondary | ICD-10-CM | POA: Diagnosis not present

## 2019-08-28 DIAGNOSIS — D631 Anemia in chronic kidney disease: Secondary | ICD-10-CM | POA: Diagnosis not present

## 2019-08-28 DIAGNOSIS — D509 Iron deficiency anemia, unspecified: Secondary | ICD-10-CM | POA: Diagnosis not present

## 2019-08-30 DIAGNOSIS — N2581 Secondary hyperparathyroidism of renal origin: Secondary | ICD-10-CM | POA: Diagnosis not present

## 2019-08-30 DIAGNOSIS — N186 End stage renal disease: Secondary | ICD-10-CM | POA: Diagnosis not present

## 2019-08-30 DIAGNOSIS — Z23 Encounter for immunization: Secondary | ICD-10-CM | POA: Diagnosis not present

## 2019-08-30 DIAGNOSIS — D631 Anemia in chronic kidney disease: Secondary | ICD-10-CM | POA: Diagnosis not present

## 2019-08-30 DIAGNOSIS — D509 Iron deficiency anemia, unspecified: Secondary | ICD-10-CM | POA: Diagnosis not present

## 2019-09-01 DIAGNOSIS — N186 End stage renal disease: Secondary | ICD-10-CM | POA: Diagnosis not present

## 2019-09-01 DIAGNOSIS — Z992 Dependence on renal dialysis: Secondary | ICD-10-CM | POA: Diagnosis not present

## 2019-09-02 DIAGNOSIS — N186 End stage renal disease: Secondary | ICD-10-CM | POA: Diagnosis not present

## 2019-09-02 DIAGNOSIS — D631 Anemia in chronic kidney disease: Secondary | ICD-10-CM | POA: Diagnosis not present

## 2019-09-04 DIAGNOSIS — D631 Anemia in chronic kidney disease: Secondary | ICD-10-CM | POA: Diagnosis not present

## 2019-09-04 DIAGNOSIS — M109 Gout, unspecified: Secondary | ICD-10-CM | POA: Diagnosis not present

## 2019-09-04 DIAGNOSIS — N186 End stage renal disease: Secondary | ICD-10-CM | POA: Diagnosis not present

## 2019-09-06 DIAGNOSIS — D631 Anemia in chronic kidney disease: Secondary | ICD-10-CM | POA: Diagnosis not present

## 2019-09-06 DIAGNOSIS — N186 End stage renal disease: Secondary | ICD-10-CM | POA: Diagnosis not present

## 2019-09-09 DIAGNOSIS — N186 End stage renal disease: Secondary | ICD-10-CM | POA: Diagnosis not present

## 2019-09-09 DIAGNOSIS — D631 Anemia in chronic kidney disease: Secondary | ICD-10-CM | POA: Diagnosis not present

## 2019-09-11 DIAGNOSIS — D631 Anemia in chronic kidney disease: Secondary | ICD-10-CM | POA: Diagnosis not present

## 2019-09-11 DIAGNOSIS — N186 End stage renal disease: Secondary | ICD-10-CM | POA: Diagnosis not present

## 2019-09-13 DIAGNOSIS — N186 End stage renal disease: Secondary | ICD-10-CM | POA: Diagnosis not present

## 2019-09-13 DIAGNOSIS — D631 Anemia in chronic kidney disease: Secondary | ICD-10-CM | POA: Diagnosis not present

## 2019-09-16 DIAGNOSIS — N186 End stage renal disease: Secondary | ICD-10-CM | POA: Diagnosis not present

## 2019-09-16 DIAGNOSIS — D631 Anemia in chronic kidney disease: Secondary | ICD-10-CM | POA: Diagnosis not present

## 2019-09-18 DIAGNOSIS — D631 Anemia in chronic kidney disease: Secondary | ICD-10-CM | POA: Diagnosis not present

## 2019-09-18 DIAGNOSIS — N186 End stage renal disease: Secondary | ICD-10-CM | POA: Diagnosis not present

## 2019-09-18 NOTE — Progress Notes (Signed)
Cardiology Office Note   Date:  1/61/0960   ID:  Lindsey York, DOB [birth year], MRN [id number]  PCP:  System, Pcp Not In  Cardiologist:  Dr. Gwenlyn Found  CC: Follow Up   History of Present Illness: Lindsey York is a 54 y.o. female who presents for ongoing assessment and management of hypertension  HL and CAD She had a non-STEMI 08/26/2017 and catheterization performed by Dr. Angelena Form via the right femoral approach revealing a subtotally occluded first marginal branch which was stented with a Synergy drug-eluting stent (2.25 mm x 20 mill meters). She did have an 80% fairly focal mid dominant RCA stenosis which was not intervened on because of her renal insufficiency. She also had a her RCA is intervened on.performed RCA PCI and drug-eluting stenting electively as an outpatient 12/31/2017 with a Synergy drug-eluting stent via the right femoral approach.  She has started on dialysis through nephrology.  She has stopped taking her Lasix. She has lost 10 pounds and is finding that she is dizzy and lightheaded when standing.  She continues to be medically compliant with her Brilinta.  She denies any excessive bleeding or bruising.  She is hoping to be put on the kidney transplant list concerning her kidney disease.  On last visit on 06/11/2019 Brilinta was decreased to 60 mg BID. She was to follow up with nephrology for labs and possible medication adjustments concerning BP control after dialysis.   She comes today for preprocedure cardiac evaluation and she is planned on having a colonoscopy in July 2021 through [facility].   She states she is feeling much better after having begun her routine of dialysis.  Her weight is down about 10 pounds.  She denies any chest pain, dyspnea on exertion, dizziness, significant fatigue.  She has not had to use any nitroglycerin sublingual.  She is due to see nephrology for discussion of kidney transplant as she is just recently been put on the list.   She has had recent labs, most recent were in June 2020 which she will be sending [place] a copy of.  She has not been taking aspirin as her platelet level dropped when she was on it.  She is concerned about whether or not she needs to still be taking it even if it is just a couple of days a week.  She denies any bleeding, but does easily bruise and has recently had a significant bruise to her right foot, on the dorsal portion, along with a large bruise on the right pretibial area.  Past Medical History:  Diagnosis Date  . Anemia   . CKD (chronic kidney disease), stage V ([place])    "followed at [facility]" (08/28/2017)  . Coronary artery disease   . Diarrhea 09/2017  . GERD (gastroesophageal reflux disease)   . High cholesterol   . History of gout    "on daily RX"" (08/28/2017)  . Hypertension   . NSTEMI (non-ST elevated myocardial infarction) ([place]) 08/26/2017  . Systemic lupus ([place])     Past Surgical History:  Procedure Laterality Date  . BREAST SURGERY Left ~ 1978   "tumors taken out"  . CORONARY ANGIOGRAPHY N/A 12/31/2017   Procedure: CORONARY ANGIOGRAPHY;  Surgeon: Lorretta Harp, MD;  Location: [place] [facility];  Service: Cardiovascular;  Laterality: N/A;  . CORONARY STENT INTERVENTION N/A 08/28/2017   Procedure: CORONARY STENT INTERVENTION;  Surgeon: Burnell Blanks, MD;  Location: [place] [facility];  Service: Cardiovascular;  Laterality: N/A;  . CORONARY  STENT INTERVENTION  12/31/2017  . CORONARY STENT INTERVENTION N/A 12/31/2017   Procedure: CORONARY STENT INTERVENTION;  Surgeon: Lorretta Harp, MD;  Location: [place] [facility];  Service: Cardiovascular;  Laterality: N/A;  . HERNIA REPAIR    . LAPAROSCOPIC CHOLECYSTECTOMY    . LEFT HEART CATH AND CORONARY ANGIOGRAPHY N/A 08/28/2017   Procedure: LEFT HEART CATH AND CORONARY ANGIOGRAPHY;  Surgeon: Burnell Blanks, MD;  Location: [place] [facility];  Service: Cardiovascular;  Laterality: N/A;  . MOUTH SURGERY     oral  cavity biopsy  . TUBAL LIGATION    . UMBILICAL HERNIA REPAIR       Current Outpatient Medications  Medication Sig Dispense Refill  . acetaminophen (TYLENOL) 325 MG tablet Take 650 mg by mouth every 6 (six) hours as needed for moderate pain.    Marland Kitchen amLODipine (NORVASC) 10 MG tablet Take 10 mg by mouth daily.    Marland Kitchen aspirin 81 MG chewable tablet Chew 1 tablet (81 mg total) by mouth daily. 30 tablet 0  . bevacizumab (AVASTIN) 400 MG/16ML SOLN Inject 1.25 mg as directed every 30 (thirty) days.    . carvedilol (COREG) 6.25 MG tablet Take 1 tablet (6.25 mg total) by mouth 2 (two) times daily with a meal. 180 tablet 3  . cloNIDine (CATAPRES) 0.1 MG tablet Take 1 tab at 8am, 4pm and midnight (Patient taking differently: Take 1 tab at 8am and midnight) 90 tablet 0  . furosemide (LASIX) 80 MG tablet Take 1 tablet (80 mg total) by mouth every other day. Take 1 tablet daily for 3 days and then go back to taking 1 tablet every other day. (Patient taking differently: Take 80 mg by mouth every other day. ) 30 tablet 0  . hydrALAZINE (APRESOLINE) 50 MG tablet Take 50 mg by mouth in the morning and at bedtime.    . predniSONE (DELTASONE) 1 MG tablet Take 1 mg by mouth daily with breakfast. Pt takes 5mg  one day and 4mg  the next day    . predniSONE (DELTASONE) 5 MG tablet Take 5 mg by mouth daily with breakfast.    . sevelamer carbonate (RENVELA) 800 MG tablet Take 800 mg by mouth 3 (three) times daily with meals.     . ticagrelor (BRILINTA) 60 MG TABS tablet Take 1 tablet (60 mg total) by mouth 2 (two) times daily. 60 tablet 6  . ezetimibe (ZETIA) 10 MG tablet Take 1 tablet (10 mg total) by mouth daily. 90 tablet 3  . nitroGLYCERIN (NITROSTAT) 0.4 MG SL tablet Place 1 tablet (0.4 mg total) under the tongue every 5 (five) minutes as needed for chest pain. 25 tablet 11  . simvastatin (ZOCOR) 20 MG tablet Take 1 tablet (20 mg total) by mouth at bedtime. 90 tablet 3   No current facility-administered medications for  this visit.    Allergies:   Hydroxychloroquine and Codeine    Social History:  The patient  reports that she has never smoked. She has never used smokeless tobacco. She reports that she does not drink alcohol and does not use drugs.   Family History:  The patient's family history is not on file.    ROS: All other systems are reviewed and negative. Unless otherwise mentioned in H&P    PHYSICAL EXAM: VS:  Ht 5\' 3"  (1.6 m)   Wt 151 lb 6.4 oz (68.7 kg)   BMI 26.82 kg/m  , BMI Body mass index is 26.82 kg/m. GEN: Well nourished, well developed, in  no acute distress HEENT: normal Neck: no JVD, carotid bruits, or masses Cardiac: RRR; soft 1/6 systolic murmurs, rubs, or gallops,no edema  Respiratory:  Clear to auscultation bilaterally, normal work of breathing GI: soft, nontender, nondistended, + BS MS: no deformity or atrophy Skin: warm and dry, no rash, bruising noted on her right foot and healing bruise on her right pretibial area Neuro:  Strength and sensation are intact Psych: euthymic mood, full affect   EKG: Not completed this office visit.  Recent Labs: 11/06/2018: ALT 9; BUN 73; Creatinine, Ser 7.80; Potassium 4.8; Sodium 144    Lipid Panel    Component Value Date/Time   CHOL 141 11/06/2018 0910   TRIG 72 11/06/2018 0910   HDL 61 11/06/2018 0910   CHOLHDL 2.3 11/06/2018 0910   CHOLHDL 3.8 08/30/2017 0227   VLDL 22 08/30/2017 0227   LDLCALC 66 11/06/2018 0910      Wt Readings from Last 3 Encounters:  09/22/19 151 lb 6.4 oz (68.7 kg)  06/11/19 152 lb 3.2 oz (69 kg)  11/08/18 162 lb (73.5 kg)      Other studies Reviewed: LHC 08/28/2017  Prox RCA lesion is 30% stenosed.  Mid RCA lesion is 80% stenosed.  Ost 1st Diag to 1st Diag lesion is 40% stenosed.  Mid LAD lesion is 30% stenosed.  Ost 2nd Mrg lesion is 99% stenosed.  A drug-eluting stent was successfully placed using a STENT SYNERGY DES 2.25X20.  Post intervention, there is a 0% residual  stenosis.  1. NSTEMI secondary to severe stenosis in the second obtuse marginal branch 2. Successful PTCA/DES x 1 second OM branch 3. Severe stenosis in the mid segment of the large dominant RCA. This is not critical but will need to be addressed with PCI later this week.  4. Mild non-obstructive disease in the LAD and Diagonal  RCA Intervention 12/31/2017   Mid RCA lesion is 80% stenosed.  Previously placed Ost 2nd Mrg to 2nd Mrg stent (unknown type) is widely patent.  A drug-eluting stent was successfully placed.  Post intervention, there is a 0% residual stenosis.    ASSESSMENT AND PLAN:  1.  Cardiology preoperative evaluation:   Chart reviewed as part of pre-operative protocol coverage. Given past medical history and time since last visit, based on ACC/AHA guidelines, Mariyana Fulop would be at acceptable risk for the planned procedure without further cardiovascular testing.   She'll need to hold Brilinta for 5 days prior to the procedure.  She would restart the evening after colonoscopy.  2.  Hypertension: Blood pressure is very well controlled, actually on the low side today.  She denies any symptoms associated with this.  She will continue amlodipine, carvedilol, hydralazine.  3.  Coronary artery disease: Continue Brilinta 60 mg twice daily with the exception of being able to hold it for 5 days prior to colonoscopy.  I would like her to be back on enteric-coated aspirin 81 mg daily.  But would like to see her most recent labs to evaluate her platelet status.  May need to consider placing her on enteric-coated aspirin three times a week.  Make these recommendations once we've had a chance to evaluate her labs which she is faxing to [place].  4.  End-stage renal disease: Continues on routine dialysis.  She is on the kidney transplant list and is working with nephrology concerning preparation for transplant.  Colonoscopy is part of her evaluation.  Labs are drawn through  nephrology  Current medicines are reviewed at length with  the patient today.  I have spent 25 minutes dedicated to the care of this patient on the date of this encounter to include pre-visit review of records, assessment, management and diagnostic testing,with shared decision making.  Labs/ tests ordered today include: None-she is to fax [place] a copy of her recent labs of June 2021.    Phill Myron. West Pugh, ANP, [facility]   09/22/2019 8:54 AM    [facility] [address] Office [phone] Fax [phone]  Notice: This dictation was prepared with Dragon dictation along with smaller phrase technology. Any transcriptional errors that result from this process are unintentional and may not be corrected upon review.

## 2019-09-20 DIAGNOSIS — D631 Anemia in chronic kidney disease: Secondary | ICD-10-CM | POA: Diagnosis not present

## 2019-09-20 DIAGNOSIS — N186 End stage renal disease: Secondary | ICD-10-CM | POA: Diagnosis not present

## 2019-09-22 ENCOUNTER — Other Ambulatory Visit: Payer: Self-pay

## 2019-09-22 ENCOUNTER — Ambulatory Visit (INDEPENDENT_AMBULATORY_CARE_PROVIDER_SITE_OTHER): Payer: Medicare Other | Admitting: Adult Health

## 2019-09-22 ENCOUNTER — Encounter: Payer: Self-pay | Admitting: Adult Health

## 2019-09-22 VITALS — BP 100/68 | HR 76 | Ht 63.0 in | Wt 151.4 lb

## 2019-09-22 DIAGNOSIS — N186 End stage renal disease: Secondary | ICD-10-CM | POA: Diagnosis not present

## 2019-09-22 DIAGNOSIS — I10 Essential (primary) hypertension: Secondary | ICD-10-CM | POA: Diagnosis not present

## 2019-09-22 DIAGNOSIS — I2510 Atherosclerotic heart disease of native coronary artery without angina pectoris: Secondary | ICD-10-CM

## 2019-09-22 DIAGNOSIS — Z01810 Encounter for preprocedural cardiovascular examination: Secondary | ICD-10-CM | POA: Diagnosis not present

## 2019-09-22 MED ORDER — NITROGLYCERIN 0.4 MG SL SUBL: 0.4000 mg | SUBLINGUAL_TABLET | SUBLINGUAL | 2 refills | Status: DC | PRN

## 2019-09-22 MED ORDER — TICAGRELOR 60 MG PO TABS: 60.0000 mg | ORAL_TABLET | Freq: Two times a day (BID) | ORAL | 3 refills | Status: DC

## 2019-09-22 MED ORDER — CARVEDILOL 6.25 MG PO TABS: 6.2500 mg | ORAL_TABLET | Freq: Two times a day (BID) | ORAL | 3 refills | Status: DC

## 2019-09-22 MED ORDER — HYDRALAZINE HCL 50 MG PO TABS: 50.0000 mg | ORAL_TABLET | Freq: Two times a day (BID) | ORAL | 3 refills | Status: DC

## 2019-09-22 MED ORDER — AMLODIPINE BESYLATE 10 MG PO TABS: 10.0000 mg | ORAL_TABLET | Freq: Every day | ORAL | 3 refills | Status: DC

## 2019-09-22 NOTE — Patient Instructions (Signed)

## 2019-09-23 DIAGNOSIS — D631 Anemia in chronic kidney disease: Secondary | ICD-10-CM | POA: Diagnosis not present

## 2019-09-23 DIAGNOSIS — H35712 Central serous chorioretinopathy, left eye: Secondary | ICD-10-CM | POA: Diagnosis not present

## 2019-09-23 DIAGNOSIS — N186 End stage renal disease: Secondary | ICD-10-CM | POA: Diagnosis not present

## 2019-09-23 DIAGNOSIS — Z992 Dependence on renal dialysis: Secondary | ICD-10-CM | POA: Diagnosis not present

## 2019-09-23 DIAGNOSIS — M3214 Glomerular disease in systemic lupus erythematosus: Secondary | ICD-10-CM | POA: Diagnosis not present

## 2019-09-23 DIAGNOSIS — Z955 Presence of coronary angioplasty implant and graft: Secondary | ICD-10-CM | POA: Diagnosis not present

## 2019-09-23 DIAGNOSIS — I2510 Atherosclerotic heart disease of native coronary artery without angina pectoris: Secondary | ICD-10-CM | POA: Diagnosis not present

## 2019-09-23 DIAGNOSIS — K219 Gastro-esophageal reflux disease without esophagitis: Secondary | ICD-10-CM | POA: Diagnosis not present

## 2019-09-25 DIAGNOSIS — D631 Anemia in chronic kidney disease: Secondary | ICD-10-CM | POA: Diagnosis not present

## 2019-09-25 DIAGNOSIS — N186 End stage renal disease: Secondary | ICD-10-CM | POA: Diagnosis not present

## 2019-09-27 DIAGNOSIS — N186 End stage renal disease: Secondary | ICD-10-CM | POA: Diagnosis not present

## 2019-09-27 DIAGNOSIS — D631 Anemia in chronic kidney disease: Secondary | ICD-10-CM | POA: Diagnosis not present

## 2019-09-30 DIAGNOSIS — N186 End stage renal disease: Secondary | ICD-10-CM | POA: Diagnosis not present

## 2019-09-30 DIAGNOSIS — D631 Anemia in chronic kidney disease: Secondary | ICD-10-CM | POA: Diagnosis not present

## 2019-10-01 DIAGNOSIS — Z992 Dependence on renal dialysis: Secondary | ICD-10-CM | POA: Diagnosis not present

## 2019-10-01 DIAGNOSIS — N186 End stage renal disease: Secondary | ICD-10-CM | POA: Diagnosis not present

## 2019-10-02 DIAGNOSIS — N186 End stage renal disease: Secondary | ICD-10-CM | POA: Diagnosis not present

## 2019-10-02 DIAGNOSIS — N2581 Secondary hyperparathyroidism of renal origin: Secondary | ICD-10-CM | POA: Diagnosis not present

## 2019-10-02 DIAGNOSIS — D631 Anemia in chronic kidney disease: Secondary | ICD-10-CM | POA: Diagnosis not present

## 2019-10-02 DIAGNOSIS — Z23 Encounter for immunization: Secondary | ICD-10-CM | POA: Diagnosis not present

## 2019-10-02 DIAGNOSIS — D509 Iron deficiency anemia, unspecified: Secondary | ICD-10-CM | POA: Diagnosis not present

## 2019-10-02 DIAGNOSIS — E785 Hyperlipidemia, unspecified: Secondary | ICD-10-CM | POA: Diagnosis not present

## 2019-10-04 DIAGNOSIS — E785 Hyperlipidemia, unspecified: Secondary | ICD-10-CM | POA: Diagnosis not present

## 2019-10-04 DIAGNOSIS — D509 Iron deficiency anemia, unspecified: Secondary | ICD-10-CM | POA: Diagnosis not present

## 2019-10-04 DIAGNOSIS — N186 End stage renal disease: Secondary | ICD-10-CM | POA: Diagnosis not present

## 2019-10-04 DIAGNOSIS — D631 Anemia in chronic kidney disease: Secondary | ICD-10-CM | POA: Diagnosis not present

## 2019-10-04 DIAGNOSIS — N2581 Secondary hyperparathyroidism of renal origin: Secondary | ICD-10-CM | POA: Diagnosis not present

## 2019-10-04 DIAGNOSIS — Z23 Encounter for immunization: Secondary | ICD-10-CM | POA: Diagnosis not present

## 2019-10-07 DIAGNOSIS — D509 Iron deficiency anemia, unspecified: Secondary | ICD-10-CM | POA: Diagnosis not present

## 2019-10-07 DIAGNOSIS — D631 Anemia in chronic kidney disease: Secondary | ICD-10-CM | POA: Diagnosis not present

## 2019-10-07 DIAGNOSIS — Z23 Encounter for immunization: Secondary | ICD-10-CM | POA: Diagnosis not present

## 2019-10-07 DIAGNOSIS — N2581 Secondary hyperparathyroidism of renal origin: Secondary | ICD-10-CM | POA: Diagnosis not present

## 2019-10-07 DIAGNOSIS — E785 Hyperlipidemia, unspecified: Secondary | ICD-10-CM | POA: Diagnosis not present

## 2019-10-07 DIAGNOSIS — N186 End stage renal disease: Secondary | ICD-10-CM | POA: Diagnosis not present

## 2019-10-09 DIAGNOSIS — E785 Hyperlipidemia, unspecified: Secondary | ICD-10-CM | POA: Diagnosis not present

## 2019-10-09 DIAGNOSIS — N2581 Secondary hyperparathyroidism of renal origin: Secondary | ICD-10-CM | POA: Diagnosis not present

## 2019-10-09 DIAGNOSIS — N186 End stage renal disease: Secondary | ICD-10-CM | POA: Diagnosis not present

## 2019-10-09 DIAGNOSIS — D631 Anemia in chronic kidney disease: Secondary | ICD-10-CM | POA: Diagnosis not present

## 2019-10-09 DIAGNOSIS — M109 Gout, unspecified: Secondary | ICD-10-CM | POA: Diagnosis not present

## 2019-10-09 DIAGNOSIS — Z23 Encounter for immunization: Secondary | ICD-10-CM | POA: Diagnosis not present

## 2019-10-09 DIAGNOSIS — D509 Iron deficiency anemia, unspecified: Secondary | ICD-10-CM | POA: Diagnosis not present

## 2019-10-11 DIAGNOSIS — N186 End stage renal disease: Secondary | ICD-10-CM | POA: Diagnosis not present

## 2019-10-11 DIAGNOSIS — E785 Hyperlipidemia, unspecified: Secondary | ICD-10-CM | POA: Diagnosis not present

## 2019-10-11 DIAGNOSIS — N2581 Secondary hyperparathyroidism of renal origin: Secondary | ICD-10-CM | POA: Diagnosis not present

## 2019-10-11 DIAGNOSIS — Z23 Encounter for immunization: Secondary | ICD-10-CM | POA: Diagnosis not present

## 2019-10-11 DIAGNOSIS — D509 Iron deficiency anemia, unspecified: Secondary | ICD-10-CM | POA: Diagnosis not present

## 2019-10-11 DIAGNOSIS — D631 Anemia in chronic kidney disease: Secondary | ICD-10-CM | POA: Diagnosis not present

## 2019-10-14 ENCOUNTER — Other Ambulatory Visit: Payer: Self-pay | Admitting: Adult Health

## 2019-10-14 ENCOUNTER — Other Ambulatory Visit: Payer: Self-pay

## 2019-10-14 DIAGNOSIS — D631 Anemia in chronic kidney disease: Secondary | ICD-10-CM | POA: Diagnosis not present

## 2019-10-14 DIAGNOSIS — E785 Hyperlipidemia, unspecified: Secondary | ICD-10-CM | POA: Diagnosis not present

## 2019-10-14 DIAGNOSIS — D509 Iron deficiency anemia, unspecified: Secondary | ICD-10-CM | POA: Diagnosis not present

## 2019-10-14 DIAGNOSIS — N2581 Secondary hyperparathyroidism of renal origin: Secondary | ICD-10-CM | POA: Diagnosis not present

## 2019-10-14 DIAGNOSIS — N186 End stage renal disease: Secondary | ICD-10-CM | POA: Diagnosis not present

## 2019-10-14 DIAGNOSIS — Z23 Encounter for immunization: Secondary | ICD-10-CM | POA: Diagnosis not present

## 2019-10-14 MED ORDER — EZETIMIBE 10 MG PO TABS: 10.0000 mg | ORAL_TABLET | Freq: Every day | ORAL | 3 refills | Status: DC

## 2019-10-15 ENCOUNTER — Other Ambulatory Visit: Payer: Self-pay | Admitting: Cardiovascular Disease

## 2019-10-15 DIAGNOSIS — N186 End stage renal disease: Secondary | ICD-10-CM | POA: Diagnosis not present

## 2019-10-15 DIAGNOSIS — I252 Old myocardial infarction: Secondary | ICD-10-CM | POA: Diagnosis not present

## 2019-10-15 DIAGNOSIS — M1991 Primary osteoarthritis, unspecified site: Secondary | ICD-10-CM | POA: Diagnosis not present

## 2019-10-15 DIAGNOSIS — M329 Systemic lupus erythematosus, unspecified: Secondary | ICD-10-CM | POA: Diagnosis not present

## 2019-10-15 DIAGNOSIS — K5750 Diverticulosis of both small and large intestine without perforation or abscess without bleeding: Secondary | ICD-10-CM | POA: Diagnosis not present

## 2019-10-15 DIAGNOSIS — I2510 Atherosclerotic heart disease of native coronary artery without angina pectoris: Secondary | ICD-10-CM | POA: Diagnosis not present

## 2019-10-15 DIAGNOSIS — K5730 Diverticulosis of large intestine without perforation or abscess without bleeding: Secondary | ICD-10-CM | POA: Diagnosis not present

## 2019-10-15 DIAGNOSIS — K635 Polyp of colon: Secondary | ICD-10-CM | POA: Diagnosis not present

## 2019-10-15 DIAGNOSIS — M109 Gout, unspecified: Secondary | ICD-10-CM | POA: Diagnosis not present

## 2019-10-15 DIAGNOSIS — D120 Benign neoplasm of cecum: Secondary | ICD-10-CM | POA: Diagnosis not present

## 2019-10-15 DIAGNOSIS — D631 Anemia in chronic kidney disease: Secondary | ICD-10-CM | POA: Diagnosis not present

## 2019-10-15 DIAGNOSIS — Z1211 Encounter for screening for malignant neoplasm of colon: Secondary | ICD-10-CM | POA: Diagnosis not present

## 2019-10-15 DIAGNOSIS — I120 Hypertensive chronic kidney disease with stage 5 chronic kidney disease or end stage renal disease: Secondary | ICD-10-CM | POA: Diagnosis not present

## 2019-10-15 DIAGNOSIS — E785 Hyperlipidemia, unspecified: Secondary | ICD-10-CM | POA: Diagnosis not present

## 2019-10-15 DIAGNOSIS — K219 Gastro-esophageal reflux disease without esophagitis: Secondary | ICD-10-CM | POA: Diagnosis not present

## 2019-10-16 ENCOUNTER — Telehealth: Payer: Self-pay | Admitting: Cardiovascular Disease

## 2019-10-16 NOTE — Telephone Encounter (Signed)
*  STAT* If patient is at the pharmacy, call can be transferred to refill team.   1. Which medications need to be refilled? (please list name of each medication and dose if known) simvastatin (ZOCOR) 20 MG tablet  2. Which pharmacy/location (including street and city if local pharmacy) is medication to be sent to? [id number] - [place], [place] - [address]  3. Do they need a 30 day or 90 day supply? 90 day

## 2019-10-18 DIAGNOSIS — N2581 Secondary hyperparathyroidism of renal origin: Secondary | ICD-10-CM | POA: Diagnosis not present

## 2019-10-18 DIAGNOSIS — N186 End stage renal disease: Secondary | ICD-10-CM | POA: Diagnosis not present

## 2019-10-18 DIAGNOSIS — D631 Anemia in chronic kidney disease: Secondary | ICD-10-CM | POA: Diagnosis not present

## 2019-10-18 DIAGNOSIS — D509 Iron deficiency anemia, unspecified: Secondary | ICD-10-CM | POA: Diagnosis not present

## 2019-10-18 DIAGNOSIS — E785 Hyperlipidemia, unspecified: Secondary | ICD-10-CM | POA: Diagnosis not present

## 2019-10-18 DIAGNOSIS — Z23 Encounter for immunization: Secondary | ICD-10-CM | POA: Diagnosis not present

## 2019-10-20 DIAGNOSIS — H353221 Exudative age-related macular degeneration, left eye, with active choroidal neovascularization: Secondary | ICD-10-CM | POA: Diagnosis not present

## 2019-10-20 DIAGNOSIS — H35713 Central serous chorioretinopathy, bilateral: Secondary | ICD-10-CM | POA: Diagnosis not present

## 2019-10-20 DIAGNOSIS — H2513 Age-related nuclear cataract, bilateral: Secondary | ICD-10-CM | POA: Diagnosis not present

## 2019-10-21 DIAGNOSIS — E785 Hyperlipidemia, unspecified: Secondary | ICD-10-CM | POA: Diagnosis not present

## 2019-10-21 DIAGNOSIS — N186 End stage renal disease: Secondary | ICD-10-CM | POA: Diagnosis not present

## 2019-10-21 DIAGNOSIS — N2581 Secondary hyperparathyroidism of renal origin: Secondary | ICD-10-CM | POA: Diagnosis not present

## 2019-10-21 DIAGNOSIS — D631 Anemia in chronic kidney disease: Secondary | ICD-10-CM | POA: Diagnosis not present

## 2019-10-21 DIAGNOSIS — D509 Iron deficiency anemia, unspecified: Secondary | ICD-10-CM | POA: Diagnosis not present

## 2019-10-21 DIAGNOSIS — Z23 Encounter for immunization: Secondary | ICD-10-CM | POA: Diagnosis not present

## 2019-10-23 DIAGNOSIS — D509 Iron deficiency anemia, unspecified: Secondary | ICD-10-CM | POA: Diagnosis not present

## 2019-10-23 DIAGNOSIS — N2581 Secondary hyperparathyroidism of renal origin: Secondary | ICD-10-CM | POA: Diagnosis not present

## 2019-10-23 DIAGNOSIS — E785 Hyperlipidemia, unspecified: Secondary | ICD-10-CM | POA: Diagnosis not present

## 2019-10-23 DIAGNOSIS — N186 End stage renal disease: Secondary | ICD-10-CM | POA: Diagnosis not present

## 2019-10-23 DIAGNOSIS — Z23 Encounter for immunization: Secondary | ICD-10-CM | POA: Diagnosis not present

## 2019-10-23 DIAGNOSIS — D631 Anemia in chronic kidney disease: Secondary | ICD-10-CM | POA: Diagnosis not present

## 2019-10-25 DIAGNOSIS — N186 End stage renal disease: Secondary | ICD-10-CM | POA: Diagnosis not present

## 2019-10-25 DIAGNOSIS — D509 Iron deficiency anemia, unspecified: Secondary | ICD-10-CM | POA: Diagnosis not present

## 2019-10-25 DIAGNOSIS — Z23 Encounter for immunization: Secondary | ICD-10-CM | POA: Diagnosis not present

## 2019-10-25 DIAGNOSIS — D631 Anemia in chronic kidney disease: Secondary | ICD-10-CM | POA: Diagnosis not present

## 2019-10-25 DIAGNOSIS — N2581 Secondary hyperparathyroidism of renal origin: Secondary | ICD-10-CM | POA: Diagnosis not present

## 2019-10-25 DIAGNOSIS — E785 Hyperlipidemia, unspecified: Secondary | ICD-10-CM | POA: Diagnosis not present

## 2019-10-28 DIAGNOSIS — D631 Anemia in chronic kidney disease: Secondary | ICD-10-CM | POA: Diagnosis not present

## 2019-10-28 DIAGNOSIS — N186 End stage renal disease: Secondary | ICD-10-CM | POA: Diagnosis not present

## 2019-10-28 DIAGNOSIS — N2581 Secondary hyperparathyroidism of renal origin: Secondary | ICD-10-CM | POA: Diagnosis not present

## 2019-10-28 DIAGNOSIS — E785 Hyperlipidemia, unspecified: Secondary | ICD-10-CM | POA: Diagnosis not present

## 2019-10-28 DIAGNOSIS — D509 Iron deficiency anemia, unspecified: Secondary | ICD-10-CM | POA: Diagnosis not present

## 2019-10-28 DIAGNOSIS — Z23 Encounter for immunization: Secondary | ICD-10-CM | POA: Diagnosis not present

## 2019-10-30 DIAGNOSIS — N2581 Secondary hyperparathyroidism of renal origin: Secondary | ICD-10-CM | POA: Diagnosis not present

## 2019-10-30 DIAGNOSIS — D631 Anemia in chronic kidney disease: Secondary | ICD-10-CM | POA: Diagnosis not present

## 2019-10-30 DIAGNOSIS — Z23 Encounter for immunization: Secondary | ICD-10-CM | POA: Diagnosis not present

## 2019-10-30 DIAGNOSIS — E785 Hyperlipidemia, unspecified: Secondary | ICD-10-CM | POA: Diagnosis not present

## 2019-10-30 DIAGNOSIS — N186 End stage renal disease: Secondary | ICD-10-CM | POA: Diagnosis not present

## 2019-10-30 DIAGNOSIS — D509 Iron deficiency anemia, unspecified: Secondary | ICD-10-CM | POA: Diagnosis not present

## 2019-11-01 DIAGNOSIS — D631 Anemia in chronic kidney disease: Secondary | ICD-10-CM | POA: Diagnosis not present

## 2019-11-01 DIAGNOSIS — Z23 Encounter for immunization: Secondary | ICD-10-CM | POA: Diagnosis not present

## 2019-11-01 DIAGNOSIS — N2581 Secondary hyperparathyroidism of renal origin: Secondary | ICD-10-CM | POA: Diagnosis not present

## 2019-11-01 DIAGNOSIS — N186 End stage renal disease: Secondary | ICD-10-CM | POA: Diagnosis not present

## 2019-11-01 DIAGNOSIS — E785 Hyperlipidemia, unspecified: Secondary | ICD-10-CM | POA: Diagnosis not present

## 2019-11-01 DIAGNOSIS — Z992 Dependence on renal dialysis: Secondary | ICD-10-CM | POA: Diagnosis not present

## 2019-11-01 DIAGNOSIS — D509 Iron deficiency anemia, unspecified: Secondary | ICD-10-CM | POA: Diagnosis not present

## 2019-11-03 DIAGNOSIS — Z124 Encounter for screening for malignant neoplasm of cervix: Secondary | ICD-10-CM | POA: Diagnosis not present

## 2019-11-03 DIAGNOSIS — N952 Postmenopausal atrophic vaginitis: Secondary | ICD-10-CM | POA: Diagnosis not present

## 2019-11-04 DIAGNOSIS — D631 Anemia in chronic kidney disease: Secondary | ICD-10-CM | POA: Diagnosis not present

## 2019-11-04 DIAGNOSIS — N186 End stage renal disease: Secondary | ICD-10-CM | POA: Diagnosis not present

## 2019-11-04 DIAGNOSIS — D509 Iron deficiency anemia, unspecified: Secondary | ICD-10-CM | POA: Diagnosis not present

## 2019-11-06 DIAGNOSIS — N186 End stage renal disease: Secondary | ICD-10-CM | POA: Diagnosis not present

## 2019-11-06 DIAGNOSIS — D509 Iron deficiency anemia, unspecified: Secondary | ICD-10-CM | POA: Diagnosis not present

## 2019-11-06 DIAGNOSIS — M109 Gout, unspecified: Secondary | ICD-10-CM | POA: Diagnosis not present

## 2019-11-06 DIAGNOSIS — D631 Anemia in chronic kidney disease: Secondary | ICD-10-CM | POA: Diagnosis not present

## 2019-11-08 DIAGNOSIS — D631 Anemia in chronic kidney disease: Secondary | ICD-10-CM | POA: Diagnosis not present

## 2019-11-08 DIAGNOSIS — N186 End stage renal disease: Secondary | ICD-10-CM | POA: Diagnosis not present

## 2019-11-08 DIAGNOSIS — D509 Iron deficiency anemia, unspecified: Secondary | ICD-10-CM | POA: Diagnosis not present

## 2019-11-11 DIAGNOSIS — N186 End stage renal disease: Secondary | ICD-10-CM | POA: Diagnosis not present

## 2019-11-11 DIAGNOSIS — D509 Iron deficiency anemia, unspecified: Secondary | ICD-10-CM | POA: Diagnosis not present

## 2019-11-11 DIAGNOSIS — D631 Anemia in chronic kidney disease: Secondary | ICD-10-CM | POA: Diagnosis not present

## 2019-11-15 DIAGNOSIS — N186 End stage renal disease: Secondary | ICD-10-CM | POA: Diagnosis not present

## 2019-11-15 DIAGNOSIS — D631 Anemia in chronic kidney disease: Secondary | ICD-10-CM | POA: Diagnosis not present

## 2019-11-15 DIAGNOSIS — D509 Iron deficiency anemia, unspecified: Secondary | ICD-10-CM | POA: Diagnosis not present

## 2019-11-18 DIAGNOSIS — D509 Iron deficiency anemia, unspecified: Secondary | ICD-10-CM | POA: Diagnosis not present

## 2019-11-18 DIAGNOSIS — D631 Anemia in chronic kidney disease: Secondary | ICD-10-CM | POA: Diagnosis not present

## 2019-11-18 DIAGNOSIS — N186 End stage renal disease: Secondary | ICD-10-CM | POA: Diagnosis not present

## 2019-11-20 DIAGNOSIS — N186 End stage renal disease: Secondary | ICD-10-CM | POA: Diagnosis not present

## 2019-11-20 DIAGNOSIS — D631 Anemia in chronic kidney disease: Secondary | ICD-10-CM | POA: Diagnosis not present

## 2019-11-20 DIAGNOSIS — D509 Iron deficiency anemia, unspecified: Secondary | ICD-10-CM | POA: Diagnosis not present

## 2019-11-22 DIAGNOSIS — D631 Anemia in chronic kidney disease: Secondary | ICD-10-CM | POA: Diagnosis not present

## 2019-11-22 DIAGNOSIS — N186 End stage renal disease: Secondary | ICD-10-CM | POA: Diagnosis not present

## 2019-11-22 DIAGNOSIS — D509 Iron deficiency anemia, unspecified: Secondary | ICD-10-CM | POA: Diagnosis not present

## 2019-11-24 DIAGNOSIS — H2513 Age-related nuclear cataract, bilateral: Secondary | ICD-10-CM | POA: Diagnosis not present

## 2019-11-24 DIAGNOSIS — H353221 Exudative age-related macular degeneration, left eye, with active choroidal neovascularization: Secondary | ICD-10-CM | POA: Diagnosis not present

## 2019-11-24 DIAGNOSIS — H35713 Central serous chorioretinopathy, bilateral: Secondary | ICD-10-CM | POA: Diagnosis not present

## 2019-11-25 DIAGNOSIS — D509 Iron deficiency anemia, unspecified: Secondary | ICD-10-CM | POA: Diagnosis not present

## 2019-11-25 DIAGNOSIS — N186 End stage renal disease: Secondary | ICD-10-CM | POA: Diagnosis not present

## 2019-11-25 DIAGNOSIS — D631 Anemia in chronic kidney disease: Secondary | ICD-10-CM | POA: Diagnosis not present

## 2019-11-26 DIAGNOSIS — Z1231 Encounter for screening mammogram for malignant neoplasm of breast: Secondary | ICD-10-CM | POA: Diagnosis not present

## 2019-11-27 DIAGNOSIS — D509 Iron deficiency anemia, unspecified: Secondary | ICD-10-CM | POA: Diagnosis not present

## 2019-11-27 DIAGNOSIS — N186 End stage renal disease: Secondary | ICD-10-CM | POA: Diagnosis not present

## 2019-11-27 DIAGNOSIS — D631 Anemia in chronic kidney disease: Secondary | ICD-10-CM | POA: Diagnosis not present

## 2019-11-28 DIAGNOSIS — B078 Other viral warts: Secondary | ICD-10-CM | POA: Diagnosis not present

## 2019-11-29 DIAGNOSIS — N186 End stage renal disease: Secondary | ICD-10-CM | POA: Diagnosis not present

## 2019-11-29 DIAGNOSIS — D509 Iron deficiency anemia, unspecified: Secondary | ICD-10-CM | POA: Diagnosis not present

## 2019-11-29 DIAGNOSIS — D631 Anemia in chronic kidney disease: Secondary | ICD-10-CM | POA: Diagnosis not present

## 2019-12-02 DIAGNOSIS — N186 End stage renal disease: Secondary | ICD-10-CM | POA: Diagnosis not present

## 2019-12-02 DIAGNOSIS — D509 Iron deficiency anemia, unspecified: Secondary | ICD-10-CM | POA: Diagnosis not present

## 2019-12-02 DIAGNOSIS — Z992 Dependence on renal dialysis: Secondary | ICD-10-CM | POA: Diagnosis not present

## 2019-12-02 DIAGNOSIS — D631 Anemia in chronic kidney disease: Secondary | ICD-10-CM | POA: Diagnosis not present

## 2019-12-04 DIAGNOSIS — N186 End stage renal disease: Secondary | ICD-10-CM | POA: Diagnosis not present

## 2019-12-04 DIAGNOSIS — M109 Gout, unspecified: Secondary | ICD-10-CM | POA: Diagnosis not present

## 2019-12-04 DIAGNOSIS — D899 Disorder involving the immune mechanism, unspecified: Secondary | ICD-10-CM | POA: Diagnosis not present

## 2019-12-04 DIAGNOSIS — Z23 Encounter for immunization: Secondary | ICD-10-CM | POA: Diagnosis not present

## 2019-12-04 DIAGNOSIS — D509 Iron deficiency anemia, unspecified: Secondary | ICD-10-CM | POA: Diagnosis not present

## 2019-12-04 DIAGNOSIS — D631 Anemia in chronic kidney disease: Secondary | ICD-10-CM | POA: Diagnosis not present

## 2019-12-05 ENCOUNTER — Encounter: Payer: Self-pay | Admitting: Cardiovascular Disease

## 2019-12-05 ENCOUNTER — Ambulatory Visit (INDEPENDENT_AMBULATORY_CARE_PROVIDER_SITE_OTHER): Payer: Medicare Other | Admitting: Cardiovascular Disease

## 2019-12-05 ENCOUNTER — Other Ambulatory Visit: Payer: Self-pay

## 2019-12-05 VITALS — BP 112/64 | HR 71 | Ht 63.0 in | Wt 154.0 lb

## 2019-12-05 DIAGNOSIS — E782 Mixed hyperlipidemia: Secondary | ICD-10-CM

## 2019-12-05 DIAGNOSIS — I10 Essential (primary) hypertension: Secondary | ICD-10-CM

## 2019-12-05 DIAGNOSIS — I214 Non-ST elevation (NSTEMI) myocardial infarction: Secondary | ICD-10-CM | POA: Diagnosis not present

## 2019-12-05 MED ORDER — ASPIRIN EC 81 MG PO TBEC: 81.0000 mg | DELAYED_RELEASE_TABLET | Freq: Every day | ORAL | 3 refills | Status: AC

## 2019-12-05 MED ORDER — CARVEDILOL 6.25 MG PO TABS: 6.2500 mg | ORAL_TABLET | Freq: Two times a day (BID) | ORAL | 3 refills | Status: DC

## 2019-12-05 NOTE — Progress Notes (Signed)
0/11/1446 [name]   [birth year]  [id number]  Primary Physician Patient, No Pcp Per Primary Cardiologist: Lorretta Harp MD Garret Reddish, [place], [place]  HPI:  Lindsey York is a 54 y.o.  moderately overweight divorced African-American female mother of 2 children who Ilast saw in the office  11/08/2018.She has a history of systemic lupus erythematosus, CKD 4 with serum creatinine is in the 5-6 range predialysis previously on the transplant list at [facility]. She also has history of treated hypertension hyperlipidemia. She currently does not work. She has never smoked. She had a non-STEMI 08/26/2017 and catheterization performed by Dr. Angelena Form via the right femoral approach revealing a subtotally occluded first marginal branch which was stented with a synergy drug-eluting stent (2.25 mm x 20 mill meters). She did have an 80% fairly focal mid dominant RCA stenosis which was not intervened on because of her renal insufficiency. She is otherwise asymptomatic but wishes to continue on the renal transplant list which cannot occur until her RCA is intervened on.  I performed RCA PCI and drug-eluting stenting electively as an outpatient 12/31/2017 with a synergy drug-eluting stent via the right femoral approach. She has done well since and remains on dual antiplatelet therapy. I did use only 25 cc of contrast and this did not affect her renal function.  Since I saw her a year ago she is done well.  She did start hemodialysis in March of this year and is doing well with that.  She had some bleeding from her fistula site and stopped her aspirin.  I think it since it is been 2 years since her intervention she can safely stop her Brilinta and go back on baby aspirin.  She denies chest pain or shortness of breath.  Current Meds  Medication Sig  . acetaminophen (TYLENOL) 325 MG tablet Take 650 mg by mouth every 6 (six) hours as needed for moderate pain.  Marland Kitchen acyclovir  ointment (ZOVIRAX) 5 % Apply twice a day to affected area as needed  . amLODipine (NORVASC) 10 MG tablet Take 1 tablet (10 mg total) by mouth daily.  . B COMPLEX-C-FOLIC ACID ER PO Take by mouth.  . bevacizumab (AVASTIN) 400 MG/16ML SOLN Inject 1.25 mg as directed every 30 (thirty) days.  . calcium carbonate (OS-CAL) 1250 (500 Ca) MG chewable tablet Chew by mouth.  . carvedilol (COREG) 6.25 MG tablet Take 1 tablet (6.25 mg total) by mouth 2 (two) times daily with a meal.  . cloNIDine (CATAPRES) 0.1 MG tablet Take 1 tab at 8am, 4pm and midnight (Patient taking differently: Take 0.1 mg by mouth 2 (two) times daily. Take 1 tab at 8am and midnight)  . conjugated estrogens (PREMARIN) vaginal cream   . ezetimibe (ZETIA) 10 MG tablet TAKE 1 TABLET(10 MG) BY MOUTH DAILY  . furosemide (LASIX) 80 MG tablet Take 1 tablet (80 mg total) by mouth every other day. Take 1 tablet daily for 3 days and then go back to taking 1 tablet every other day. (Patient taking differently: Take 80 mg by mouth every other day. )  . hydrALAZINE (APRESOLINE) 50 MG tablet Take 1 tablet (50 mg total) by mouth in the morning and at bedtime.  . nitroGLYCERIN (NITROSTAT) 0.4 MG SL tablet Place 1 tablet (0.4 mg total) under the tongue every 5 (five) minutes as needed for chest pain.  . predniSONE (DELTASONE) 1 MG tablet Take 4 mg by mouth daily with breakfast. Pt takes 4mg  the every day  .  sevelamer carbonate (RENVELA) 800 MG tablet Take 800 mg by mouth 3 (three) times daily with meals.   . simvastatin (ZOCOR) 20 MG tablet TAKE 1 TABLET(20 MG) BY MOUTH AT BEDTIME  . valACYclovir (VALTREX) 500 MG tablet Take by mouth.  . [DISCONTINUED] carvedilol (COREG) 6.25 MG tablet Take 1 tablet (6.25 mg total) by mouth 2 (two) times daily with a meal.  . [DISCONTINUED] ticagrelor (BRILINTA) 60 MG TABS tablet Take 1 tablet (60 mg total) by mouth 2 (two) times daily.     Allergies  Allergen Reactions  . Hydroxychloroquine Hives and Rash  .  Codeine     Social History   Socioeconomic History  . Marital status: Divorced    Spouse name: Not on file  . Number of children: Not on file  . Years of education: Not on file  . Highest education level: Not on file  Occupational History  . Not on file  Tobacco Use  . Smoking status: Never Smoker  . Smokeless tobacco: Never Used  Vaping Use  . Vaping Use: Never used  Substance and Sexual Activity  . Alcohol use: Never  . Drug use: Never  . Sexual activity: Yes  Other Topics Concern  . Not on file  Social History Narrative  . Not on file   Social Determinants of Health   Financial Resource Strain:   . Difficulty of Paying Living Expenses: Not on file  Food Insecurity:   . Worried About Charity fundraiser in the Last Year: Not on file  . Ran Out of Food in the Last Year: Not on file  Transportation Needs:   . Lack of Transportation (Medical): Not on file  . Lack of Transportation (Non-Medical): Not on file  Physical Activity:   . Days of Exercise per Week: Not on file  . Minutes of Exercise per Session: Not on file  Stress:   . Feeling of Stress : Not on file  Social Connections:   . Frequency of Communication with Friends and Family: Not on file  . Frequency of Social Gatherings with Friends and Family: Not on file  . Attends Religious Services: Not on file  . Active Member of Clubs or Organizations: Not on file  . Attends Archivist Meetings: Not on file  . Marital Status: Not on file  Intimate Partner Violence:   . Fear of Current or Ex-Partner: Not on file  . Emotionally Abused: Not on file  . Physically Abused: Not on file  . Sexually Abused: Not on file     Review of Systems: General: negative for chills, fever, night sweats or weight changes.  Cardiovascular: negative for chest pain, dyspnea on exertion, edema, orthopnea, palpitations, paroxysmal nocturnal dyspnea or shortness of breath Dermatological: negative for rash Respiratory:  negative for cough or wheezing Urologic: negative for hematuria Abdominal: negative for nausea, vomiting, diarrhea, bright red blood per rectum, melena, or hematemesis Neurologic: negative for visual changes, syncope, or dizziness All other systems reviewed and are otherwise negative except as noted above.    Blood pressure 112/64, pulse 71, height 5\' 3"  (1.6 m), weight 154 lb (69.9 kg), SpO2 98 %.  General appearance: alert and no distress Neck: no adenopathy, no carotid bruit, no JVD, supple, symmetrical, trachea midline and thyroid not enlarged, symmetric, no tenderness/mass/nodules Lungs: clear to auscultation bilaterally Heart: regular rate and rhythm, S1, S2 normal, no murmur, click, rub or gallop Extremities: extremities normal, atraumatic, no cyanosis or edema Pulses: 2+ and symmetric Skin: Skin  color, texture, turgor normal. No rashes or lesions Neurologic: Alert and oriented X 3, normal strength and tone. Normal symmetric reflexes. Normal coordination and gait  EKG sinus rhythm at 71 with nonspecific ST and T wave changes.  I personally reviewed this EKG.  ASSESSMENT AND PLAN:   NSTEMI (non-ST elevated myocardial infarction) ([place]) History of non-STEMI 08/26/2017 with chronic catheterization performed via the right femoral approach by Dr. Angelena Form revealing a tight subtotally occluded first marginal branch which was stented with a synergy drug-eluting stent (2.25 mm x 20 mm).  She did have a fairly focal dominant mid RCA stenosis which should underwent staged intervention by myself electively 12/31/2017.  This was performed with 25 cc of contrast because of her renal function.  She is done well since.  She has remained on Brilinta twice a day but has stopped aspirin because of bleeding from her fistula.  She denies chest pain or shortness of breath.  I told her that she can stop the Brilinta and go back on a baby aspirin.  Hypertension History of essential hypertension with blood  pressure measured today 112/64.  She is on hydralazine.  Hyperlipidemia History of hyperlipidemia on statin therapy with lipid profile performed 11/06/2018 revealed a total cholesterol of 141, LDL 66 and HDL 61.      Lorretta Harp MD FACP,FACC,FAHA, [facility] 12/05/2019 9:07 AM

## 2019-12-05 NOTE — Patient Instructions (Signed)
Medication Instructions:  Stop Brilinta  Start ASA 81 mg daily   *If you need a refill on your cardiac medications before your next appointment, please call your pharmacy*   Follow-Up: At [facility], you and your health needs are our priority.  As part of our continuing mission to provide you with exceptional heart care, we have created designated Provider Care Teams.  These Care Teams include your primary Cardiologist (physician) and Advanced Practice Providers (APPs -  Physician Assistants and Nurse Practitioners) who all work together to provide you with the care you need, when you need it.  We recommend signing up for the patient portal called "MyChart".  Sign up information is provided on this After Visit Summary.  MyChart is used to connect with patients for Virtual Visits (Telemedicine).  Patients are able to view lab/test results, encounter notes, upcoming appointments, etc.  Non-urgent messages can be sent to your provider as well.   To learn more about what you can do with MyChart, go to [email or web address].    Your next appointment:   [name], NP in 6 months Dr.Berry in 12 months

## 2019-12-05 NOTE — Assessment & Plan Note (Signed)
History of essential hypertension with blood pressure measured today 112/64.  She is on hydralazine.

## 2019-12-05 NOTE — Assessment & Plan Note (Signed)
History of hyperlipidemia on statin therapy with lipid profile performed 11/06/2018 revealed a total cholesterol of 141, LDL 66 and HDL 61.

## 2019-12-05 NOTE — Assessment & Plan Note (Signed)
History of non-STEMI 08/26/2017 with chronic catheterization performed via the right femoral approach by Dr. Angelena Form revealing a tight subtotally occluded first marginal branch which was stented with a synergy drug-eluting stent (2.25 mm x 20 mm).  She did have a fairly focal dominant mid RCA stenosis which should underwent staged intervention by myself electively 12/31/2017.  This was performed with 25 cc of contrast because of her renal function.  She is done well since.  She has remained on Brilinta twice a day but has stopped aspirin because of bleeding from her fistula.  She denies chest pain or shortness of breath.  I told her that she can stop the Brilinta and go back on a baby aspirin.

## 2019-12-06 DIAGNOSIS — D631 Anemia in chronic kidney disease: Secondary | ICD-10-CM | POA: Diagnosis not present

## 2019-12-06 DIAGNOSIS — Z23 Encounter for immunization: Secondary | ICD-10-CM | POA: Diagnosis not present

## 2019-12-06 DIAGNOSIS — D899 Disorder involving the immune mechanism, unspecified: Secondary | ICD-10-CM | POA: Diagnosis not present

## 2019-12-06 DIAGNOSIS — N186 End stage renal disease: Secondary | ICD-10-CM | POA: Diagnosis not present

## 2019-12-06 DIAGNOSIS — D509 Iron deficiency anemia, unspecified: Secondary | ICD-10-CM | POA: Diagnosis not present

## 2019-12-09 DIAGNOSIS — D899 Disorder involving the immune mechanism, unspecified: Secondary | ICD-10-CM | POA: Diagnosis not present

## 2019-12-09 DIAGNOSIS — Z23 Encounter for immunization: Secondary | ICD-10-CM | POA: Diagnosis not present

## 2019-12-09 DIAGNOSIS — N186 End stage renal disease: Secondary | ICD-10-CM | POA: Diagnosis not present

## 2019-12-09 DIAGNOSIS — D509 Iron deficiency anemia, unspecified: Secondary | ICD-10-CM | POA: Diagnosis not present

## 2019-12-09 DIAGNOSIS — D631 Anemia in chronic kidney disease: Secondary | ICD-10-CM | POA: Diagnosis not present

## 2019-12-11 DIAGNOSIS — D509 Iron deficiency anemia, unspecified: Secondary | ICD-10-CM | POA: Diagnosis not present

## 2019-12-11 DIAGNOSIS — D631 Anemia in chronic kidney disease: Secondary | ICD-10-CM | POA: Diagnosis not present

## 2019-12-11 DIAGNOSIS — D899 Disorder involving the immune mechanism, unspecified: Secondary | ICD-10-CM | POA: Diagnosis not present

## 2019-12-11 DIAGNOSIS — N186 End stage renal disease: Secondary | ICD-10-CM | POA: Diagnosis not present

## 2019-12-11 DIAGNOSIS — Z23 Encounter for immunization: Secondary | ICD-10-CM | POA: Diagnosis not present

## 2019-12-13 DIAGNOSIS — N186 End stage renal disease: Secondary | ICD-10-CM | POA: Diagnosis not present

## 2019-12-13 DIAGNOSIS — D631 Anemia in chronic kidney disease: Secondary | ICD-10-CM | POA: Diagnosis not present

## 2019-12-13 DIAGNOSIS — D509 Iron deficiency anemia, unspecified: Secondary | ICD-10-CM | POA: Diagnosis not present

## 2019-12-13 DIAGNOSIS — D899 Disorder involving the immune mechanism, unspecified: Secondary | ICD-10-CM | POA: Diagnosis not present

## 2019-12-13 DIAGNOSIS — Z23 Encounter for immunization: Secondary | ICD-10-CM | POA: Diagnosis not present

## 2019-12-16 DIAGNOSIS — Z23 Encounter for immunization: Secondary | ICD-10-CM | POA: Diagnosis not present

## 2019-12-16 DIAGNOSIS — Z043 Encounter for examination and observation following other accident: Secondary | ICD-10-CM | POA: Diagnosis not present

## 2019-12-16 DIAGNOSIS — D509 Iron deficiency anemia, unspecified: Secondary | ICD-10-CM | POA: Diagnosis not present

## 2019-12-16 DIAGNOSIS — D631 Anemia in chronic kidney disease: Secondary | ICD-10-CM | POA: Diagnosis not present

## 2019-12-16 DIAGNOSIS — D899 Disorder involving the immune mechanism, unspecified: Secondary | ICD-10-CM | POA: Diagnosis not present

## 2019-12-16 DIAGNOSIS — N186 End stage renal disease: Secondary | ICD-10-CM | POA: Diagnosis not present

## 2019-12-16 DIAGNOSIS — R0781 Pleurodynia: Secondary | ICD-10-CM | POA: Diagnosis not present

## 2019-12-18 DIAGNOSIS — N186 End stage renal disease: Secondary | ICD-10-CM | POA: Diagnosis not present

## 2019-12-18 DIAGNOSIS — Z23 Encounter for immunization: Secondary | ICD-10-CM | POA: Diagnosis not present

## 2019-12-18 DIAGNOSIS — D631 Anemia in chronic kidney disease: Secondary | ICD-10-CM | POA: Diagnosis not present

## 2019-12-18 DIAGNOSIS — D899 Disorder involving the immune mechanism, unspecified: Secondary | ICD-10-CM | POA: Diagnosis not present

## 2019-12-18 DIAGNOSIS — D509 Iron deficiency anemia, unspecified: Secondary | ICD-10-CM | POA: Diagnosis not present

## 2019-12-20 DIAGNOSIS — Z23 Encounter for immunization: Secondary | ICD-10-CM | POA: Diagnosis not present

## 2019-12-20 DIAGNOSIS — D631 Anemia in chronic kidney disease: Secondary | ICD-10-CM | POA: Diagnosis not present

## 2019-12-20 DIAGNOSIS — D509 Iron deficiency anemia, unspecified: Secondary | ICD-10-CM | POA: Diagnosis not present

## 2019-12-20 DIAGNOSIS — N186 End stage renal disease: Secondary | ICD-10-CM | POA: Diagnosis not present

## 2019-12-20 DIAGNOSIS — D899 Disorder involving the immune mechanism, unspecified: Secondary | ICD-10-CM | POA: Diagnosis not present

## 2019-12-23 DIAGNOSIS — D899 Disorder involving the immune mechanism, unspecified: Secondary | ICD-10-CM | POA: Diagnosis not present

## 2019-12-23 DIAGNOSIS — D509 Iron deficiency anemia, unspecified: Secondary | ICD-10-CM | POA: Diagnosis not present

## 2019-12-23 DIAGNOSIS — Z23 Encounter for immunization: Secondary | ICD-10-CM | POA: Diagnosis not present

## 2019-12-23 DIAGNOSIS — N186 End stage renal disease: Secondary | ICD-10-CM | POA: Diagnosis not present

## 2019-12-23 DIAGNOSIS — D631 Anemia in chronic kidney disease: Secondary | ICD-10-CM | POA: Diagnosis not present

## 2019-12-24 ENCOUNTER — Ambulatory Visit: Payer: Medicare Other | Admitting: Cardiovascular Disease

## 2019-12-25 ENCOUNTER — Telehealth: Payer: Self-pay | Admitting: Cardiovascular Disease

## 2019-12-25 DIAGNOSIS — D899 Disorder involving the immune mechanism, unspecified: Secondary | ICD-10-CM | POA: Diagnosis not present

## 2019-12-25 DIAGNOSIS — D631 Anemia in chronic kidney disease: Secondary | ICD-10-CM | POA: Diagnosis not present

## 2019-12-25 DIAGNOSIS — D509 Iron deficiency anemia, unspecified: Secondary | ICD-10-CM | POA: Diagnosis not present

## 2019-12-25 DIAGNOSIS — Z23 Encounter for immunization: Secondary | ICD-10-CM | POA: Diagnosis not present

## 2019-12-25 DIAGNOSIS — N186 End stage renal disease: Secondary | ICD-10-CM | POA: Diagnosis not present

## 2019-12-25 MED ORDER — HYDRALAZINE HCL 25 MG PO TABS
25.0000 mg | ORAL_TABLET | Freq: Two times a day (BID) | ORAL | 11 refills | Status: DC
Start: 2019-12-25 — End: 2020-01-21

## 2019-12-25 NOTE — Telephone Encounter (Signed)
Pt aware and new script sent via epic to [facility]

## 2019-12-25 NOTE — Telephone Encounter (Signed)
Let us cut her hydralazine back from 50 mg p.o. twice daily to 25 mg p.o. twice daily and have her monitor her blood pressure.

## 2019-12-25 NOTE — Telephone Encounter (Signed)
Spoke with pt and this am B/P was 82/49 and felt lightheaded Per pt B/P now is 121/74 and HR 72 Pt will continue to monitor and if worsens will call back Will forward to Dr Gwenlyn Found for review .Adonis Housekeeper

## 2019-12-25 NOTE — Telephone Encounter (Signed)
Pt c/o BP issue: STAT if pt c/o blurred vision, one-sided weakness or slurred speech  1. What are your last 5 BP readings? 105/65 but did get down to 80/NA  2. Are you having any other symptoms (ex. Dizziness, headache, blurred vision, passed out)? Dizziness  3. What is your BP issue? BP keeps dropping. Patient wants to go over her medication list to possibly rule out medications that could be causing this. Please advise.

## 2019-12-27 DIAGNOSIS — D631 Anemia in chronic kidney disease: Secondary | ICD-10-CM | POA: Diagnosis not present

## 2019-12-27 DIAGNOSIS — D509 Iron deficiency anemia, unspecified: Secondary | ICD-10-CM | POA: Diagnosis not present

## 2019-12-27 DIAGNOSIS — Z23 Encounter for immunization: Secondary | ICD-10-CM | POA: Diagnosis not present

## 2019-12-27 DIAGNOSIS — N186 End stage renal disease: Secondary | ICD-10-CM | POA: Diagnosis not present

## 2019-12-27 DIAGNOSIS — D899 Disorder involving the immune mechanism, unspecified: Secondary | ICD-10-CM | POA: Diagnosis not present

## 2019-12-30 DIAGNOSIS — D509 Iron deficiency anemia, unspecified: Secondary | ICD-10-CM | POA: Diagnosis not present

## 2019-12-30 DIAGNOSIS — D631 Anemia in chronic kidney disease: Secondary | ICD-10-CM | POA: Diagnosis not present

## 2019-12-30 DIAGNOSIS — Z23 Encounter for immunization: Secondary | ICD-10-CM | POA: Diagnosis not present

## 2019-12-30 DIAGNOSIS — N186 End stage renal disease: Secondary | ICD-10-CM | POA: Diagnosis not present

## 2019-12-30 DIAGNOSIS — D899 Disorder involving the immune mechanism, unspecified: Secondary | ICD-10-CM | POA: Diagnosis not present

## 2019-12-31 DIAGNOSIS — Z7952 Long term (current) use of systemic steroids: Secondary | ICD-10-CM | POA: Diagnosis not present

## 2019-12-31 DIAGNOSIS — M329 Systemic lupus erythematosus, unspecified: Secondary | ICD-10-CM | POA: Diagnosis not present

## 2019-12-31 DIAGNOSIS — Z888 Allergy status to other drugs, medicaments and biological substances status: Secondary | ICD-10-CM | POA: Diagnosis not present

## 2019-12-31 DIAGNOSIS — Z79899 Other long term (current) drug therapy: Secondary | ICD-10-CM | POA: Diagnosis not present

## 2019-12-31 DIAGNOSIS — M1A9XX0 Chronic gout, unspecified, without tophus (tophi): Secondary | ICD-10-CM | POA: Diagnosis not present

## 2019-12-31 DIAGNOSIS — M109 Gout, unspecified: Secondary | ICD-10-CM | POA: Diagnosis not present

## 2019-12-31 DIAGNOSIS — Z885 Allergy status to narcotic agent status: Secondary | ICD-10-CM | POA: Diagnosis not present

## 2019-12-31 DIAGNOSIS — N186 End stage renal disease: Secondary | ICD-10-CM | POA: Diagnosis not present

## 2019-12-31 DIAGNOSIS — M3214 Glomerular disease in systemic lupus erythematosus: Secondary | ICD-10-CM | POA: Diagnosis not present

## 2019-12-31 DIAGNOSIS — M810 Age-related osteoporosis without current pathological fracture: Secondary | ICD-10-CM | POA: Diagnosis not present

## 2019-12-31 DIAGNOSIS — I120 Hypertensive chronic kidney disease with stage 5 chronic kidney disease or end stage renal disease: Secondary | ICD-10-CM | POA: Diagnosis not present

## 2019-12-31 DIAGNOSIS — I959 Hypotension, unspecified: Secondary | ICD-10-CM | POA: Diagnosis not present

## 2020-01-01 DIAGNOSIS — N186 End stage renal disease: Secondary | ICD-10-CM | POA: Diagnosis not present

## 2020-01-01 DIAGNOSIS — Z992 Dependence on renal dialysis: Secondary | ICD-10-CM | POA: Diagnosis not present

## 2020-01-01 DIAGNOSIS — D899 Disorder involving the immune mechanism, unspecified: Secondary | ICD-10-CM | POA: Diagnosis not present

## 2020-01-01 DIAGNOSIS — D631 Anemia in chronic kidney disease: Secondary | ICD-10-CM | POA: Diagnosis not present

## 2020-01-01 DIAGNOSIS — D509 Iron deficiency anemia, unspecified: Secondary | ICD-10-CM | POA: Diagnosis not present

## 2020-01-01 DIAGNOSIS — Z23 Encounter for immunization: Secondary | ICD-10-CM | POA: Diagnosis not present

## 2020-01-03 DIAGNOSIS — D631 Anemia in chronic kidney disease: Secondary | ICD-10-CM | POA: Diagnosis not present

## 2020-01-03 DIAGNOSIS — E785 Hyperlipidemia, unspecified: Secondary | ICD-10-CM | POA: Diagnosis not present

## 2020-01-03 DIAGNOSIS — Z23 Encounter for immunization: Secondary | ICD-10-CM | POA: Diagnosis not present

## 2020-01-03 DIAGNOSIS — N186 End stage renal disease: Secondary | ICD-10-CM | POA: Diagnosis not present

## 2020-01-03 DIAGNOSIS — N2581 Secondary hyperparathyroidism of renal origin: Secondary | ICD-10-CM | POA: Diagnosis not present

## 2020-01-06 DIAGNOSIS — Z23 Encounter for immunization: Secondary | ICD-10-CM | POA: Diagnosis not present

## 2020-01-06 DIAGNOSIS — D631 Anemia in chronic kidney disease: Secondary | ICD-10-CM | POA: Diagnosis not present

## 2020-01-06 DIAGNOSIS — N186 End stage renal disease: Secondary | ICD-10-CM | POA: Diagnosis not present

## 2020-01-06 DIAGNOSIS — N2581 Secondary hyperparathyroidism of renal origin: Secondary | ICD-10-CM | POA: Diagnosis not present

## 2020-01-06 DIAGNOSIS — E785 Hyperlipidemia, unspecified: Secondary | ICD-10-CM | POA: Diagnosis not present

## 2020-01-08 DIAGNOSIS — N2581 Secondary hyperparathyroidism of renal origin: Secondary | ICD-10-CM | POA: Diagnosis not present

## 2020-01-08 DIAGNOSIS — D631 Anemia in chronic kidney disease: Secondary | ICD-10-CM | POA: Diagnosis not present

## 2020-01-08 DIAGNOSIS — N186 End stage renal disease: Secondary | ICD-10-CM | POA: Diagnosis not present

## 2020-01-08 DIAGNOSIS — Z23 Encounter for immunization: Secondary | ICD-10-CM | POA: Diagnosis not present

## 2020-01-08 DIAGNOSIS — E785 Hyperlipidemia, unspecified: Secondary | ICD-10-CM | POA: Diagnosis not present

## 2020-01-08 DIAGNOSIS — M109 Gout, unspecified: Secondary | ICD-10-CM | POA: Diagnosis not present

## 2020-01-10 DIAGNOSIS — E785 Hyperlipidemia, unspecified: Secondary | ICD-10-CM | POA: Diagnosis not present

## 2020-01-10 DIAGNOSIS — Z23 Encounter for immunization: Secondary | ICD-10-CM | POA: Diagnosis not present

## 2020-01-10 DIAGNOSIS — N186 End stage renal disease: Secondary | ICD-10-CM | POA: Diagnosis not present

## 2020-01-10 DIAGNOSIS — M329 Systemic lupus erythematosus, unspecified: Secondary | ICD-10-CM | POA: Diagnosis not present

## 2020-01-10 DIAGNOSIS — D631 Anemia in chronic kidney disease: Secondary | ICD-10-CM | POA: Diagnosis not present

## 2020-01-10 DIAGNOSIS — N2581 Secondary hyperparathyroidism of renal origin: Secondary | ICD-10-CM | POA: Diagnosis not present

## 2020-01-12 DIAGNOSIS — H2513 Age-related nuclear cataract, bilateral: Secondary | ICD-10-CM | POA: Diagnosis not present

## 2020-01-12 DIAGNOSIS — H353221 Exudative age-related macular degeneration, left eye, with active choroidal neovascularization: Secondary | ICD-10-CM | POA: Diagnosis not present

## 2020-01-12 DIAGNOSIS — H35713 Central serous chorioretinopathy, bilateral: Secondary | ICD-10-CM | POA: Diagnosis not present

## 2020-01-13 DIAGNOSIS — N2581 Secondary hyperparathyroidism of renal origin: Secondary | ICD-10-CM | POA: Diagnosis not present

## 2020-01-13 DIAGNOSIS — D631 Anemia in chronic kidney disease: Secondary | ICD-10-CM | POA: Diagnosis not present

## 2020-01-13 DIAGNOSIS — E785 Hyperlipidemia, unspecified: Secondary | ICD-10-CM | POA: Diagnosis not present

## 2020-01-13 DIAGNOSIS — N186 End stage renal disease: Secondary | ICD-10-CM | POA: Diagnosis not present

## 2020-01-13 DIAGNOSIS — Z23 Encounter for immunization: Secondary | ICD-10-CM | POA: Diagnosis not present

## 2020-01-15 DIAGNOSIS — Z23 Encounter for immunization: Secondary | ICD-10-CM | POA: Diagnosis not present

## 2020-01-15 DIAGNOSIS — N2581 Secondary hyperparathyroidism of renal origin: Secondary | ICD-10-CM | POA: Diagnosis not present

## 2020-01-15 DIAGNOSIS — N186 End stage renal disease: Secondary | ICD-10-CM | POA: Diagnosis not present

## 2020-01-15 DIAGNOSIS — E785 Hyperlipidemia, unspecified: Secondary | ICD-10-CM | POA: Diagnosis not present

## 2020-01-15 DIAGNOSIS — D631 Anemia in chronic kidney disease: Secondary | ICD-10-CM | POA: Diagnosis not present

## 2020-01-17 DIAGNOSIS — N2581 Secondary hyperparathyroidism of renal origin: Secondary | ICD-10-CM | POA: Diagnosis not present

## 2020-01-17 DIAGNOSIS — Z23 Encounter for immunization: Secondary | ICD-10-CM | POA: Diagnosis not present

## 2020-01-17 DIAGNOSIS — D631 Anemia in chronic kidney disease: Secondary | ICD-10-CM | POA: Diagnosis not present

## 2020-01-17 DIAGNOSIS — N186 End stage renal disease: Secondary | ICD-10-CM | POA: Diagnosis not present

## 2020-01-17 DIAGNOSIS — E785 Hyperlipidemia, unspecified: Secondary | ICD-10-CM | POA: Diagnosis not present

## 2020-01-20 DIAGNOSIS — E785 Hyperlipidemia, unspecified: Secondary | ICD-10-CM | POA: Diagnosis not present

## 2020-01-20 DIAGNOSIS — Z23 Encounter for immunization: Secondary | ICD-10-CM | POA: Diagnosis not present

## 2020-01-20 DIAGNOSIS — N2581 Secondary hyperparathyroidism of renal origin: Secondary | ICD-10-CM | POA: Diagnosis not present

## 2020-01-20 DIAGNOSIS — N186 End stage renal disease: Secondary | ICD-10-CM | POA: Diagnosis not present

## 2020-01-20 DIAGNOSIS — D631 Anemia in chronic kidney disease: Secondary | ICD-10-CM | POA: Diagnosis not present

## 2020-01-21 ENCOUNTER — Telehealth: Payer: Self-pay | Admitting: Cardiovascular Disease

## 2020-01-21 NOTE — Telephone Encounter (Signed)
Pt called to report that her BP has been running low and her her Nephrologist Dr. Gardner Candle took her off of her Amlodipine and Hydralazine about a week ago but she says she is still running low... this morning was 107/11 and HR 73 but she does not have a record of all of her other readings... she will  start keeping a record for [place] over the next few days.. she feels well but gets a little light headed but she attributes that to not eating well on her dialysis days..  She is clinically unable to consume much water and she is still on Lasix 80 mg qod.   She also on Coreg and clonidine but not sure if she should hold her doses when she checks her BP and it is low like today 107/77.   I will forward to Dr. Gwenlyn Found.. the dialysis center asked her to call us since her BP has been starting out low on her dialysis days and it is making her weak... she will call them to see if she can have a list of her readings when she has been there.. she goes three times a week.

## 2020-01-21 NOTE — Telephone Encounter (Signed)
Pt called and said that she take dialysis and is having a hard time controlling her BP therefore its hard for her to get dialysis. Please call to discuss

## 2020-01-22 DIAGNOSIS — E785 Hyperlipidemia, unspecified: Secondary | ICD-10-CM | POA: Diagnosis not present

## 2020-01-22 DIAGNOSIS — N186 End stage renal disease: Secondary | ICD-10-CM | POA: Diagnosis not present

## 2020-01-22 DIAGNOSIS — N2581 Secondary hyperparathyroidism of renal origin: Secondary | ICD-10-CM | POA: Diagnosis not present

## 2020-01-22 DIAGNOSIS — Z23 Encounter for immunization: Secondary | ICD-10-CM | POA: Diagnosis not present

## 2020-01-22 DIAGNOSIS — D631 Anemia in chronic kidney disease: Secondary | ICD-10-CM | POA: Diagnosis not present

## 2020-01-22 NOTE — Telephone Encounter (Signed)
Attempted to call the pt but unable to LM her mailbox is full. Will try again at a later time.

## 2020-01-22 NOTE — Telephone Encounter (Signed)
Please have her keep a 30 day BP log and see a Pharm D in 4 weeks to review and make approp changes

## 2020-01-24 DIAGNOSIS — N2581 Secondary hyperparathyroidism of renal origin: Secondary | ICD-10-CM | POA: Diagnosis not present

## 2020-01-24 DIAGNOSIS — E785 Hyperlipidemia, unspecified: Secondary | ICD-10-CM | POA: Diagnosis not present

## 2020-01-24 DIAGNOSIS — D631 Anemia in chronic kidney disease: Secondary | ICD-10-CM | POA: Diagnosis not present

## 2020-01-24 DIAGNOSIS — N186 End stage renal disease: Secondary | ICD-10-CM | POA: Diagnosis not present

## 2020-01-24 DIAGNOSIS — Z23 Encounter for immunization: Secondary | ICD-10-CM | POA: Diagnosis not present

## 2020-01-26 NOTE — Telephone Encounter (Signed)
Left message for patient to track bp and bring record to appointment 01/29/20 with jesse cleaver np.

## 2020-01-26 NOTE — Telephone Encounter (Signed)
   Pt returning call, she said she will have appointment today and to call her back tomorrow morning.

## 2020-01-27 DIAGNOSIS — E785 Hyperlipidemia, unspecified: Secondary | ICD-10-CM | POA: Diagnosis not present

## 2020-01-27 DIAGNOSIS — D631 Anemia in chronic kidney disease: Secondary | ICD-10-CM | POA: Diagnosis not present

## 2020-01-27 DIAGNOSIS — N2581 Secondary hyperparathyroidism of renal origin: Secondary | ICD-10-CM | POA: Diagnosis not present

## 2020-01-27 DIAGNOSIS — Z23 Encounter for immunization: Secondary | ICD-10-CM | POA: Diagnosis not present

## 2020-01-27 DIAGNOSIS — N186 End stage renal disease: Secondary | ICD-10-CM | POA: Diagnosis not present

## 2020-01-27 NOTE — Telephone Encounter (Signed)
Spoke with the patient regarding her issues with BP. Informed the patient of Dr. Rachel Bo recommendation to keep a record of her BP and bring to upcoming 10/28 appointment with JC, NP. Patient states she has been keeping a BP log and will have it at her upcoming appointment. Her biggest concern is finding some consistency with her Coreg and making sure she has some sort of schedule to follow in the future.

## 2020-01-27 NOTE — Telephone Encounter (Signed)
Patient returning [facility] phone call, will await a call back.

## 2020-01-28 ENCOUNTER — Ambulatory Visit: Payer: Medicare Other | Admitting: General Practice

## 2020-01-28 NOTE — Progress Notes (Signed)
Virtual Visit via Telephone Note   This visit type was conducted due to national recommendations for restrictions regarding the COVID-19 Pandemic (e.g. social distancing) in an effort to limit this patient's exposure and mitigate transmission in our community.  Due to her co-morbid illnesses, this patient is at least at moderate risk for complications without adequate follow up.  This format is felt to be most appropriate for this patient at this time.  The patient did not have access to video technology/had technical difficulties with video requiring transitioning to audio format only (telephone).  All issues noted in this document were discussed and addressed.  No physical exam could be performed with this format.  Please refer to the patient's chart for her  consent to telehealth for [facility].  Evaluation Performed:  Follow-up visit  This visit type was conducted due to national recommendations for restrictions regarding the COVID-19 Pandemic (e.g. social distancing).  This format is felt to be most appropriate for this patient at this time.  All issues noted in this document were discussed and addressed.  No physical exam was performed (except for noted visual exam findings with Video Visits).  Please refer to the patient's chart (MyChart message for video visits and phone note for telephone visits) for the patient's consent to telehealth for [place]  Date:  88/50/2774   ID:  Lindsey York, DOB [birth year], MRN [id number]  Patient Location:  [place] [address]   Provider location:     [address] Office [phone] Fax [phone]   PCP:  Patient, No Pcp Per  Cardiologist:  Quay Burow, MD  Electrophysiologist:  None   Chief Complaint: Follow-up for hypertension/CAD  History of Present Illness:    Lindsey York is a 54 y.o. female who presents via audio/video  conferencing for a telehealth visit today.  Patient verified DOB and address.  She has a PMH of lupus, HTN, HLD, gout, GERD, CKD stage V, anemia NSTEMI 08/26/2017.  Underwent cardiac catheterization by Dr. Angelena Form via right femoral approach which showed a subtotal occluded first marginal branch which received DES x1.  She also was noted to have 80% mid RCA stenosis which was not intervened on due to her renal insufficiency.  She underwent subsequent PCI and received DES x1 to her RCA 01/01/2028.  She is on the renal [facility] list at [facility].  She was last seen by Dr. Gwenlyn Found on 12/05/2019.  During this time she had no cardiac complaints.  She was started on hemodialysis 3/21 and was doing well.  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  Due to the time since her PCI her Brilinta was also safely stopped and she was placed back on 81 mg aspirin.  She denied chest pain and shortness of breath.  She contacted nurse triage line on 01/21/2020 and indicated that she was having periods of low blood pressure.  This was making her hemodialysis challenging.  She is seen virtually today in follow-up and states she feels well.  She has been having trouble with her blood pressure being too low prior to hemodialysis.  The nephrologist has discontinued her amlodipine and hydralazine.  She has tried to continue to take her clonidine and her carvedilol however her blood pressures have still remained fairly low and they are not able to pull the amount of fluid that is needed during her HD  treatments.  She reports compliance with her aspirin and has not had further bleeding issues from her fistula.  I will discontinue her clonidine and change her carvedilol to 3.125 twice daily.  She continues to monitor her diet closely and stay physically active.  She remains on the [facility] renal transplant list.  I will give her the salty 6 diet sheet, have her increase her  physical activity as tolerated, keep a blood pressure log, and have her follow-up in 1 month.  Today she denies chest pain, shortness of breath, lower extremity edema, fatigue, palpitations, melena, hematuria, hemoptysis, diaphoresis, weakness, presyncope, syncope, orthopnea, and PND.   The patient does not symptoms concerning for COVID-19 infection (fever, chills, cough, or new SHORTNESS OF BREATH).    Prior CV studies:   The following studies were reviewed today:  Echocardiogram 08/29/2017  Study Conclusions   - Left ventricle: The cavity size was normal. Wall thickness was  increased in a pattern of moderate LVH. Systolic function was  normal. The estimated ejection fraction was in the range of 60%  to 65%. Wall motion was normal; there were no regional wall  motion abnormalities. Doppler parameters are consistent with  abnormal left ventricular relaxation (grade 1 diastolic  dysfunction). The E/e&' ratio is between 8-15, suggesting  indeterminate LV filling pressure.  - Aortic valve: Trileaflet. There was no stenosis. There was  trivial regurgitation.  - Mitral valve: Mildly thickened leaflets . There was trivial  regurgitation.  - Left atrium: The atrium was normal in size.  - Tricuspid valve: There was trivial regurgitation.  - Pulmonary arteries: PA peak pressure: 21 mm Hg (S).  - Inferior vena cava: The vessel was normal in size. The  respirophasic diameter changes were in the normal range (>= 50%),  consistent with normal central venous pressure.   Cardiac catheterization 12/31/2017  Mid RCA lesion is 80% stenosed.  Previously placed Ost 2nd Mrg to 2nd Mrg stent (unknown type) is widely patent.  A drug-eluting stent was successfully placed.  Post intervention, there is a 0% residual stenosis.   Lindsey York is a 54 y.o. 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[place])    Past Surgical History:  Procedure Laterality Date   BREAST SURGERY Left ~ 1978   "tumors taken out"   CORONARY ANGIOGRAPHY N/A 12/31/2017   Procedure: CORONARY ANGIOGRAPHY;  Surgeon: Lorretta Harp, MD;  Location: [place] [facility];  Service: Cardiovascular;  Laterality: N/A;   CORONARY STENT INTERVENTION N/A 08/28/2017   Procedure: CORONARY STENT INTERVENTION;  Surgeon: Burnell Blanks, MD;  Location: [place] [facility];  Service: Cardiovascular;  Laterality: N/A;   CORONARY STENT INTERVENTION  12/31/2017   CORONARY STENT INTERVENTION N/A 12/31/2017   Procedure: CORONARY STENT INTERVENTION;  Surgeon: Lorretta Harp, MD;  Location: [place] [facility];  Service: Cardiovascular;  Laterality: N/A;   HERNIA REPAIR     LAPAROSCOPIC CHOLECYSTECTOMY     LEFT HEART CATH AND CORONARY ANGIOGRAPHY N/A 08/28/2017   Procedure: LEFT HEART  CATH AND CORONARY ANGIOGRAPHY;  Surgeon: Burnell Blanks, MD;  Location: [place] [facility];  Service: Cardiovascular;  Laterality: N/A;   MOUTH SURGERY     oral cavity biopsy   TUBAL LIGATION     UMBILICAL HERNIA REPAIR       No outpatient medications have been marked as taking for the 01/29/20 encounter (Appointment) with Deberah Pelton, NP.     Allergies:   Hydroxychloroquine and Codeine   Social History   Tobacco Use   Smoking status: Never Smoker   Smokeless tobacco: Never Used  Vaping Use   Vaping Use: Never used  Substance Use Topics   Alcohol use: Never   Drug use: Never     Family Hx: The patient's family history is not on file.  ROS:   Please see the  history of present illness.     All other systems reviewed and are negative.   Labs/Other Tests and Data Reviewed:    Recent Labs: No results found for requested labs within last 8760 hours.   Recent Lipid Panel Lab Results  Component Value Date/Time   CHOL 141 11/06/2018 09:10 AM   TRIG 72 11/06/2018 09:10 AM   HDL 61 11/06/2018 09:10 AM   CHOLHDL 2.3 11/06/2018 09:10 AM   CHOLHDL 3.8 08/30/2017 02:27 AM   LDLCALC 66 11/06/2018 09:10 AM    Wt Readings from Last 3 Encounters:  12/05/19 154 lb (69.9 kg)  09/22/19 151 lb 6.4 oz (68.7 kg)  06/11/19 152 lb 3.2 oz (69 kg)     Exam:    Vital Signs:  There were no vitals taken for this visit.   Well nourished, well developed female in no  acute distress.   ASSESSMENT & PLAN:    1.  Coronary artery disease-no chest pain today.  Underwent cardiac catheterization on 08/26/2017 and received PCI with DES x1 to her first marginal branch.  Due to her renal function her RCA was not intervened upon at that time.  She underwent cardiac catheterization 12/31/2017 and received PCI to her RCA.  Given the 2 years since stent placement her Brilinta was stopped due to bleeding from her fistula.  ASA restarted Continue aspirin, carvedilol, clonidine, furosemide, Zetia, simvastatin Heart healthy low-sodium diet-salty 6 given Increase physical activity as tolerated  Essential hypertension-BP today 124/83.  Well-controlled at home. Continue carvedilol, furosemide, clonidine, Heart healthy low-sodium diet-salty 6 given Increase physical activity as tolerated  HLD-LDL 66 on 11/06/2018 Continue Zetia, simvastatin Heart healthy low-sodium high-fiber diet Increase physical activity as tolerated  Disposition: Follow-up with Dr. Gwenlyn Found or me in 1 month.  COVID-19 Education: The signs and symptoms of COVID-19 were discussed with the patient and how to seek care for testing (follow up with PCP or arrange E-visit).  The importance of social distancing  was discussed today.  Patient Risk:   After full review of this patients clinical status, I feel that they are at least moderate risk at this time.  Time:   Today, I have spent 16 minutes with the patient with telehealth technology discussing hemodialysis, medications, diet, body fluid.  I spent greater than 20 minutes reviewing the patient's past medical history, prior medications, cardiac tests and previous cardiac notes.   Medication Adjustments/Labs and Tests Ordered: Current medicines are reviewed at length with the patient today.  Concerns regarding medicines are outlined above.   Tests Ordered: No orders of the defined types were placed in this encounter.  Medication Changes: No orders of the defined  types were placed in this encounter.   Disposition:  in 1 month(s)  Signed, Jossie Ng. Brookelin Felber NP-C    11/05/2018 11:58 AM    [address] Office [phone] Fax [phone]

## 2020-01-29 ENCOUNTER — Telehealth (INDEPENDENT_AMBULATORY_CARE_PROVIDER_SITE_OTHER): Payer: Medicare Other | Admitting: General Practice

## 2020-01-29 ENCOUNTER — Encounter: Payer: Self-pay | Admitting: General Practice

## 2020-01-29 VITALS — BP 124/83 | Ht 63.0 in | Wt 154.0 lb

## 2020-01-29 DIAGNOSIS — I10 Essential (primary) hypertension: Secondary | ICD-10-CM | POA: Diagnosis not present

## 2020-01-29 DIAGNOSIS — D631 Anemia in chronic kidney disease: Secondary | ICD-10-CM | POA: Diagnosis not present

## 2020-01-29 DIAGNOSIS — I2510 Atherosclerotic heart disease of native coronary artery without angina pectoris: Secondary | ICD-10-CM | POA: Diagnosis not present

## 2020-01-29 DIAGNOSIS — E782 Mixed hyperlipidemia: Secondary | ICD-10-CM | POA: Diagnosis not present

## 2020-01-29 DIAGNOSIS — Z23 Encounter for immunization: Secondary | ICD-10-CM | POA: Diagnosis not present

## 2020-01-29 DIAGNOSIS — E785 Hyperlipidemia, unspecified: Secondary | ICD-10-CM | POA: Diagnosis not present

## 2020-01-29 DIAGNOSIS — N2581 Secondary hyperparathyroidism of renal origin: Secondary | ICD-10-CM | POA: Diagnosis not present

## 2020-01-29 DIAGNOSIS — N186 End stage renal disease: Secondary | ICD-10-CM | POA: Diagnosis not present

## 2020-01-29 MED ORDER — CARVEDILOL 6.25 MG PO TABS: 3.1250 mg | ORAL_TABLET | Freq: Two times a day (BID) | ORAL | 3 refills | Status: DC

## 2020-01-29 NOTE — Patient Instructions (Addendum)
Medication Instructions:  STOP CLONIDINE  DECREASE CARVEDILOL 3.125MG  (1/2 TAB) TWICE DAILY *If you need a refill on your cardiac medications before your next appointment, please call your pharmacy  Lab Work:   Testing/Procedures:  NONE    NONE  Special Instructions TAKE AND LOG YOUR BLOOD PRESSURE DAILY, MAKE SURE TO TAKE 1 HOUR AFTER TAKING YOUR MEDICATION  PLEASE READ AND FOLLOW SALTY 6-ATTACHED  Follow-Up: Your next appointment:  1 month(s) Virtual Visit  with Coletta Memos, FNP   At [facility], you and your health needs are our priority.  As part of our continuing mission to provide you with exceptional heart care, we have created designated Provider Care Teams.  These Care Teams include your primary Cardiologist (physician) and Advanced Practice Providers (APPs -  Physician Assistants and Nurse Practitioners) who all work together to provide you with the care you need, when you need it.

## 2020-01-31 DIAGNOSIS — N2581 Secondary hyperparathyroidism of renal origin: Secondary | ICD-10-CM | POA: Diagnosis not present

## 2020-01-31 DIAGNOSIS — D631 Anemia in chronic kidney disease: Secondary | ICD-10-CM | POA: Diagnosis not present

## 2020-01-31 DIAGNOSIS — E785 Hyperlipidemia, unspecified: Secondary | ICD-10-CM | POA: Diagnosis not present

## 2020-01-31 DIAGNOSIS — N186 End stage renal disease: Secondary | ICD-10-CM | POA: Diagnosis not present

## 2020-01-31 DIAGNOSIS — Z23 Encounter for immunization: Secondary | ICD-10-CM | POA: Diagnosis not present

## 2020-02-01 DIAGNOSIS — Z992 Dependence on renal dialysis: Secondary | ICD-10-CM | POA: Diagnosis not present

## 2020-02-01 DIAGNOSIS — N186 End stage renal disease: Secondary | ICD-10-CM | POA: Diagnosis not present

## 2020-02-02 ENCOUNTER — Telehealth: Payer: Self-pay | Admitting: General Practice

## 2020-02-02 MED ORDER — CARVEDILOL 3.125 MG PO TABS
3.1250 mg | ORAL_TABLET | Freq: Two times a day (BID) | ORAL | 11 refills | Status: DC
Start: 2020-02-02 — End: 2021-07-01

## 2020-02-02 NOTE — Telephone Encounter (Signed)
   Pt c/o medication issue:  1. Name of Medication: carvedilol (COREG) 6.25 MG tablet  2. How are you currently taking this medication (dosage and times per day)? 3.125 mg 2x daily  3. Are you having a reaction (difficulty breathing--STAT)?   4. What is your medication issue? Pt said the 6.25 mg tablets do not split well. The pharmacy has the 3.125 mg tablets in stock but will just need a new rx so that the pharmacy can dispense the smaller pills    *STAT* If patient is at the pharmacy, call can be transferred to refill team.   1. Which medications need to be refilled? (please list name of each medication and dose if known) carvedilol (COREG) 3.125 mg   2. Which pharmacy/location (including street and city if local pharmacy) is medication to be sent to? [id number] - [place], [place] - [address]  3. Do they need a 30 day or 90 day supply? [place]

## 2020-02-03 DIAGNOSIS — D631 Anemia in chronic kidney disease: Secondary | ICD-10-CM | POA: Diagnosis not present

## 2020-02-03 DIAGNOSIS — N186 End stage renal disease: Secondary | ICD-10-CM | POA: Diagnosis not present

## 2020-02-05 DIAGNOSIS — D631 Anemia in chronic kidney disease: Secondary | ICD-10-CM | POA: Diagnosis not present

## 2020-02-05 DIAGNOSIS — M109 Gout, unspecified: Secondary | ICD-10-CM | POA: Diagnosis not present

## 2020-02-05 DIAGNOSIS — N186 End stage renal disease: Secondary | ICD-10-CM | POA: Diagnosis not present

## 2020-02-07 DIAGNOSIS — D631 Anemia in chronic kidney disease: Secondary | ICD-10-CM | POA: Diagnosis not present

## 2020-02-07 DIAGNOSIS — N186 End stage renal disease: Secondary | ICD-10-CM | POA: Diagnosis not present

## 2020-02-10 DIAGNOSIS — N186 End stage renal disease: Secondary | ICD-10-CM | POA: Diagnosis not present

## 2020-02-10 DIAGNOSIS — D631 Anemia in chronic kidney disease: Secondary | ICD-10-CM | POA: Diagnosis not present

## 2020-02-12 DIAGNOSIS — D631 Anemia in chronic kidney disease: Secondary | ICD-10-CM | POA: Diagnosis not present

## 2020-02-12 DIAGNOSIS — N186 End stage renal disease: Secondary | ICD-10-CM | POA: Diagnosis not present

## 2020-02-14 DIAGNOSIS — D631 Anemia in chronic kidney disease: Secondary | ICD-10-CM | POA: Diagnosis not present

## 2020-02-14 DIAGNOSIS — N186 End stage renal disease: Secondary | ICD-10-CM | POA: Diagnosis not present

## 2020-02-17 DIAGNOSIS — D631 Anemia in chronic kidney disease: Secondary | ICD-10-CM | POA: Diagnosis not present

## 2020-02-17 DIAGNOSIS — N186 End stage renal disease: Secondary | ICD-10-CM | POA: Diagnosis not present

## 2020-02-19 DIAGNOSIS — N186 End stage renal disease: Secondary | ICD-10-CM | POA: Diagnosis not present

## 2020-02-19 DIAGNOSIS — D631 Anemia in chronic kidney disease: Secondary | ICD-10-CM | POA: Diagnosis not present

## 2020-02-19 NOTE — Progress Notes (Signed)
Virtual Visit via Telephone Note   This visit type was conducted due to national recommendations for restrictions regarding the COVID-19 Pandemic (e.g. social distancing) in an effort to limit this patient's exposure and mitigate transmission in our community.  Due to her co-morbid illnesses, this patient is at least at moderate risk for complications without adequate follow up.  This format is felt to be most appropriate for this patient at this time.  The patient did not have access to video technology/had technical difficulties with video requiring transitioning to audio format only (telephone).  All issues noted in this document were discussed and addressed.  No physical exam could be performed with this format.  Please refer to the patient's chart for her  consent to telehealth for [facility].  Evaluation Performed:  Follow-up visit  This visit type was conducted due to national recommendations for restrictions regarding the COVID-19 Pandemic (e.g. social distancing).  This format is felt to be most appropriate for this patient at this time.  All issues noted in this document were discussed and addressed.  No physical exam was performed (except for noted visual exam findings with Video Visits).  Please refer to the patient's chart (MyChart message for video visits and phone note for telephone visits) for the patient's consent to telehealth for [place]  Date:  72/12/4707   ID:  Kristian Hazzard, DOB [birth year], MRN [id number]  Patient Location:  [place] [address]   Provider location:     [address] Office [phone] Fax [phone]   PCP:  Patient, No Pcp Per  Cardiologist:  Quay Burow, MD  Electrophysiologist:  None   Chief Complaint:  Follow-up for hypertension  History of Present Illness:    Lindsey York is a 54 y.o. female who presents via audio/video  conferencing for a telehealth visit today.  Patient verified DOB and address.  She has a PMH of lupus, HTN, HLD, gout, GERD, CKD stage V, anemia NSTEMI 08/26/2017.  Underwent cardiac catheterization by Dr. Angelena Form via right femoral approach which showed a subtotal occluded first marginal branch which received DES x1.  She also was noted to have 80% mid RCA stenosis which was not intervened on due to her renal insufficiency.  She underwent subsequent PCI and received DES x1 to her RCA 01/01/2028.  She is on the renal transplant list at [facility].  She was last seen by Dr. Gwenlyn Found on 12/05/2019.  During this time she had no cardiac complaints.  She was started on hemodialysis 3/21 and was doing well.  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  Due to the time since her PCI her Brilinta was also safely stopped and she was placed back on 81 mg aspirin.  She denied chest pain and shortness of breath.  She contacted nurse triage line on 01/21/2020 and indicated that she was having periods of low blood pressure.  This was making her hemodialysis challenging.  She was seen virtually 01/2820 in follow-up and stated she felt well.  She had been having trouble with her blood pressure being too low prior to hemodialysis.  The nephrologist  discontinued her amlodipine and hydralazine.  She had tried to continue to take her clonidine and her carvedilol however her blood pressures  remained fairly low and they were not able to pull the amount of fluid that was needed during her HD  treatments.  She reported compliance with her aspirin and had not had further bleeding issues from her fistula.  I  discontinued her clonidine and changed her carvedilol to 3.125 twice daily.  She continued to monitor her diet closely and stayed physically active.  She remained on the [facility] renal transplant list.  I  gave her the salty 6 diet sheet, had her increase her physical  activity as tolerated, keep a blood pressure log, and planned her follow-up for 1 month.  She is seen virtually today in follow-up and states she is currently receiving HD session.  She states that she has needed to adjust her carvedilol due to lower blood pressures.  Occasionally she is taking it only once daily.  She also states that her furosemide is only producing small amounts of urine at this time.  She is limited in her physical activity due to right foot issues/orthopedic issues.  She is planning on having a steroid injection which she has had good success with in the past.  She has received her COVID-19 booster and continues to socially distance, wearing her mask in public.  She is still hopeful for renal transplant.  She has been maintaining a strict diet, and monitoring her weight.  We will discontinue her furosemide today, repeat a lipid panel, and have her follow-up in 6 months.  Today she denies chest pain, shortness of breath, lower extremity edema, fatigue, palpitations, melena, hematuria, hemoptysis, diaphoresis, weakness, presyncope, syncope, orthopnea, and PND.   The patient does not symptoms concerning for COVID-19 infection (fever, chills, cough, or new SHORTNESS OF BREATH).    Prior CV studies:   The following studies were reviewed today:  Echocardiogram 08/29/2017  Study Conclusions   - Left ventricle: The cavity size was normal. Wall thickness was  increased in a pattern of moderate LVH. Systolic function was  normal. The estimated ejection fraction was in the range of 60%  to 65%. Wall motion was normal; there were no regional wall  motion abnormalities. Doppler parameters are consistent with  abnormal left ventricular relaxation (grade 1 diastolic  dysfunction). The E/e&' ratio is between 8-15, suggesting  indeterminate LV filling pressure.  - Aortic valve: Trileaflet. There was no stenosis. There was  trivial regurgitation.  - Mitral valve: Mildly  thickened leaflets . There was trivial  regurgitation.  - Left atrium: The atrium was normal in size.  - Tricuspid valve: There was trivial regurgitation.  - Pulmonary arteries: PA peak pressure: 21 mm Hg (S).  - Inferior vena cava: The vessel was normal in size. The  respirophasic diameter changes were in the normal range (>= 50%),  consistent with normal central venous pressure.   Cardiac catheterization 12/31/2017  Mid RCA lesion is 80% stenosed.  Previously placed Ost 2nd Mrg to 2nd Mrg stent (unknown type) is widely patent.  A drug-eluting stent was successfully placed.  Post intervention, there is a 0% residual stenosis.  Michiah Hairstonis a 55 y.o.female Diagnostic Dominance: Right  Intervention     Past Medical History:  Diagnosis Date  . Anemia   . CKD (chronic kidney disease), stage V ([place])    "followed at [facility]" (08/28/2017)  . Coronary artery disease   . Diarrhea 09/2017  . GERD (gastroesophageal reflux disease)   . High cholesterol   . History of gout    "on daily RX"" (08/28/2017)  . Hypertension   . NSTEMI (non-ST elevated myocardial infarction) ([place]) 08/26/2017  . Systemic lupus (HCC)    Past  Surgical History:  Procedure Laterality Date  . BREAST SURGERY Left ~ 1978   "tumors taken out"  . CORONARY ANGIOGRAPHY N/A 12/31/2017   Procedure: CORONARY ANGIOGRAPHY;  Surgeon: Lorretta Harp, MD;  Location: [place] CV [facility];  Service: Cardiovascular;  Laterality: N/A;  . CORONARY STENT INTERVENTION N/A 08/28/2017   Procedure: CORONARY STENT INTERVENTION;  Surgeon: Burnell Blanks, MD;  Location: [place] CV [facility];  Service: Cardiovascular;  Laterality: N/A;  . CORONARY STENT INTERVENTION  12/31/2017  . CORONARY STENT INTERVENTION N/A 12/31/2017   Procedure: CORONARY STENT INTERVENTION;  Surgeon: Lorretta Harp, MD;  Location: [place] CV LAB;  Service: Cardiovascular;  Laterality: N/A;  . HERNIA REPAIR    . LAPAROSCOPIC  CHOLECYSTECTOMY    . LEFT HEART CATH AND CORONARY ANGIOGRAPHY N/A 08/28/2017   Procedure: LEFT HEART CATH AND CORONARY ANGIOGRAPHY;  Surgeon: Burnell Blanks, MD;  Location: [place] CV [facility];  Service: Cardiovascular;  Laterality: N/A;  . MOUTH SURGERY     oral cavity biopsy  . TUBAL LIGATION    . UMBILICAL HERNIA REPAIR       Current Meds  Medication Sig  . acetaminophen (TYLENOL) 325 MG tablet Take 650 mg by mouth every 6 (six) hours as needed for moderate pain.  Marland Kitchen acyclovir ointment (ZOVIRAX) 5 % Apply twice a day to affected area as needed  . aspirin EC 81 MG tablet Take 1 tablet (81 mg total) by mouth daily. Swallow whole.  . B COMPLEX-C-FOLIC ACID ER PO Take by mouth.  . calcium carbonate (OS-CAL) 1250 (500 Ca) MG chewable tablet Chew by mouth.  . carvedilol (COREG) 3.125 MG tablet Take 1 tablet (3.125 mg total) by mouth 2 (two) times daily with a meal.  . ezetimibe (ZETIA) 10 MG tablet TAKE 1 TABLET(10 MG) BY MOUTH DAILY  . predniSONE (DELTASONE) 1 MG tablet Take 4 mg by mouth daily with breakfast. Pt takes 4mg  the every day  . sevelamer carbonate (RENVELA) 800 MG tablet Take 800 mg by mouth 3 (three) times daily with meals.   . simvastatin (ZOCOR) 20 MG tablet TAKE 1 TABLET(20 MG) BY MOUTH AT BEDTIME  . valACYclovir (VALTREX) 500 MG tablet Take by mouth. Pt takes as needed.     Allergies:   Hydroxychloroquine and Codeine   Social History   Tobacco Use  . Smoking status: Never Smoker  . Smokeless tobacco: Never Used  Vaping Use  . Vaping Use: Never used  Substance Use Topics  . Alcohol use: Never  . Drug use: Never     Family Hx: The patient's family history is not on file.  ROS:   Please see the history of present illness.     All other systems reviewed and are negative.   Labs/Other Tests and Data Reviewed:    Recent Labs: No results found for requested labs within last 8760 hours.   Recent Lipid Panel Lab Results  Component Value Date/Time    CHOL 141 11/06/2018 09:10 AM   TRIG 72 11/06/2018 09:10 AM   HDL 61 11/06/2018 09:10 AM   CHOLHDL 2.3 11/06/2018 09:10 AM   CHOLHDL 3.8 08/30/2017 02:27 AM   LDLCALC 66 11/06/2018 09:10 AM    Wt Readings from Last 3 Encounters:  02/23/20 157 lb (71.2 kg)  01/29/20 154 lb (69.9 kg)  12/05/19 154 lb (69.9 kg)     Exam:    Vital Signs:  BP 114/82   Pulse 79   Wt 157 lb (71.2 kg)  BMI 27.81 kg/m    Well nourished, well developed female in no  acute distress.   ASSESSMENT & PLAN:    1.  Essential hypertension-BP today 114/82.  Well-controlled at home.  HD blood pressures have been better.  She has been completing treatments. Continue carvedilol Stop furosemide Heart healthy low-sodium diet-salty 6 given Increase physical activity as tolerated  Coronary artery disease-continues with no chest pain.  Underwent cardiac catheterization on 08/26/2017 and received PCI with DES x1 to her first marginal branch.  Due to her renal function her RCA was not intervened upon at that time.  She underwent cardiac catheterization 12/31/2017 and received PCI to her RCA.  Given the 2 years since stent placement her Brilinta was stopped due to bleeding from her fistula.  ASA restarted Continue aspirin, carvedilol, clonidine, furosemide, Zetia, simvastatin Heart healthy low-sodium diet-salty 6 given Increase physical activity as tolerated  HLD-LDL 66 on 11/06/2018 Continue Zetia, simvastatin Heart healthy low-sodium high-fiber diet Increase physical activity as tolerated Repeat fasting lipid panel  Disposition: Follow-up with Dr. Gwenlyn Found or me in 6 months.  COVID-19 Education: The signs and symptoms of COVID-19 were discussed with the patient and how to seek care for testing (follow up with PCP or arrange E-visit).  The importance of social distancing was discussed today.  Patient Risk:   After full review of this patients clinical status, I feel that they are at least moderate risk at this  time.  Time:   Today, I have spent 11 minutes with the patient with telehealth technology discussing diet, exercise, medications, HD.  I spent greater than 20 minutes reviewing patient's previous cardiac tests, cardiac notes, and cardiac medications.   Medication Adjustments/Labs and Tests Ordered: Current medicines are reviewed at length with the patient today.  Concerns regarding medicines are outlined above.   Tests Ordered: No orders of the defined types were placed in this encounter.  Medication Changes: No orders of the defined types were placed in this encounter.   Disposition:  in 6 month(s)  Signed, Jossie Ng. Kaeleb Emond NP-C    11/05/2018 11:58 AM    [address] Office [phone] Fax [phone]

## 2020-02-21 DIAGNOSIS — D631 Anemia in chronic kidney disease: Secondary | ICD-10-CM | POA: Diagnosis not present

## 2020-02-21 DIAGNOSIS — N186 End stage renal disease: Secondary | ICD-10-CM | POA: Diagnosis not present

## 2020-02-23 ENCOUNTER — Encounter: Payer: Self-pay | Admitting: General Practice

## 2020-02-23 ENCOUNTER — Telehealth (INDEPENDENT_AMBULATORY_CARE_PROVIDER_SITE_OTHER): Payer: Medicare Other | Admitting: General Practice

## 2020-02-23 VITALS — BP 114/82 | HR 79 | Wt 157.0 lb

## 2020-02-23 DIAGNOSIS — E782 Mixed hyperlipidemia: Secondary | ICD-10-CM

## 2020-02-23 DIAGNOSIS — I10 Essential (primary) hypertension: Secondary | ICD-10-CM

## 2020-02-23 DIAGNOSIS — I2510 Atherosclerotic heart disease of native coronary artery without angina pectoris: Secondary | ICD-10-CM

## 2020-02-23 DIAGNOSIS — Z79899 Other long term (current) drug therapy: Secondary | ICD-10-CM

## 2020-02-23 DIAGNOSIS — D631 Anemia in chronic kidney disease: Secondary | ICD-10-CM | POA: Diagnosis not present

## 2020-02-23 DIAGNOSIS — N186 End stage renal disease: Secondary | ICD-10-CM | POA: Diagnosis not present

## 2020-02-23 NOTE — Patient Instructions (Signed)
Medication Instructions:  DISCONTINUE FUROSEMIDE *If you need a refill on your cardiac medications before your next appointment, please call your pharmacy*  Lab Work: FASTING LIPID THIS WEEK, IF POSSIBLE If you have labs (blood work) drawn today and your tests are completely normal, you will receive your results only by:  [place] (if you have MyChart) OR A paper copy in the mail.  If you have any lab test that is abnormal or we need to change your treatment, we will call you to review the results. You may go to any [facility] that is convenient for you however, we do have a lab in our office that is able to assist you. You DO NOT need an appointment for our lab. The lab is open 8:00am and closes at 4:00pm. Lunch 12:45 - 1:45pm.  Special Instructions TAKE AND LOG YOUR BLOOD PRESSURE CALL IF ABNORMAL  PLEASE READ AND FOLLOW SALTY 6-ATTACHED-1,800 mg daily  Follow-Up: Your next appointment:  6 month(s) In Person with Quay Burow, MD OR IF UNAVAILABLE [place], FNP-C  Please call our office 2 months in advance to schedule this appointment   At [facility], you and your health needs are our priority.  As part of our continuing mission to provide you with exceptional heart care, we have created designated Provider Care Teams.  These Care Teams include your primary Cardiologist (physician) and Advanced Practice Providers (APPs -  Physician Assistants and Nurse Practitioners) who all work together to provide you with the care you need, when you need it.

## 2020-02-25 DIAGNOSIS — N186 End stage renal disease: Secondary | ICD-10-CM | POA: Diagnosis not present

## 2020-02-25 DIAGNOSIS — D631 Anemia in chronic kidney disease: Secondary | ICD-10-CM | POA: Diagnosis not present

## 2020-02-28 DIAGNOSIS — N186 End stage renal disease: Secondary | ICD-10-CM | POA: Diagnosis not present

## 2020-02-28 DIAGNOSIS — D631 Anemia in chronic kidney disease: Secondary | ICD-10-CM | POA: Diagnosis not present

## 2020-03-01 DIAGNOSIS — H35713 Central serous chorioretinopathy, bilateral: Secondary | ICD-10-CM | POA: Diagnosis not present

## 2020-03-01 DIAGNOSIS — H2513 Age-related nuclear cataract, bilateral: Secondary | ICD-10-CM | POA: Diagnosis not present

## 2020-03-01 DIAGNOSIS — H3554 Dystrophies primarily involving the retinal pigment epithelium: Secondary | ICD-10-CM | POA: Diagnosis not present

## 2020-03-01 DIAGNOSIS — H353221 Exudative age-related macular degeneration, left eye, with active choroidal neovascularization: Secondary | ICD-10-CM | POA: Diagnosis not present

## 2020-03-02 DIAGNOSIS — Z01818 Encounter for other preprocedural examination: Secondary | ICD-10-CM | POA: Diagnosis not present

## 2020-03-02 DIAGNOSIS — Z79899 Other long term (current) drug therapy: Secondary | ICD-10-CM | POA: Diagnosis not present

## 2020-03-02 DIAGNOSIS — I120 Hypertensive chronic kidney disease with stage 5 chronic kidney disease or end stage renal disease: Secondary | ICD-10-CM | POA: Diagnosis not present

## 2020-03-02 DIAGNOSIS — D631 Anemia in chronic kidney disease: Secondary | ICD-10-CM | POA: Diagnosis not present

## 2020-03-02 DIAGNOSIS — N186 End stage renal disease: Secondary | ICD-10-CM | POA: Diagnosis not present

## 2020-03-02 DIAGNOSIS — Z992 Dependence on renal dialysis: Secondary | ICD-10-CM | POA: Diagnosis not present

## 2020-03-04 DIAGNOSIS — N186 End stage renal disease: Secondary | ICD-10-CM | POA: Diagnosis not present

## 2020-03-04 DIAGNOSIS — Z23 Encounter for immunization: Secondary | ICD-10-CM | POA: Diagnosis not present

## 2020-03-04 DIAGNOSIS — M109 Gout, unspecified: Secondary | ICD-10-CM | POA: Diagnosis not present

## 2020-03-04 DIAGNOSIS — D631 Anemia in chronic kidney disease: Secondary | ICD-10-CM | POA: Diagnosis not present

## 2020-03-05 DIAGNOSIS — D485 Neoplasm of uncertain behavior of skin: Secondary | ICD-10-CM | POA: Diagnosis not present

## 2020-03-05 DIAGNOSIS — B351 Tinea unguium: Secondary | ICD-10-CM | POA: Diagnosis not present

## 2020-03-06 DIAGNOSIS — D631 Anemia in chronic kidney disease: Secondary | ICD-10-CM | POA: Diagnosis not present

## 2020-03-06 DIAGNOSIS — N186 End stage renal disease: Secondary | ICD-10-CM | POA: Diagnosis not present

## 2020-03-06 DIAGNOSIS — Z23 Encounter for immunization: Secondary | ICD-10-CM | POA: Diagnosis not present

## 2020-03-09 DIAGNOSIS — D631 Anemia in chronic kidney disease: Secondary | ICD-10-CM | POA: Diagnosis not present

## 2020-03-09 DIAGNOSIS — N186 End stage renal disease: Secondary | ICD-10-CM | POA: Diagnosis not present

## 2020-03-09 DIAGNOSIS — Z23 Encounter for immunization: Secondary | ICD-10-CM | POA: Diagnosis not present

## 2020-03-10 IMAGING — CR DG CHEST 2V
2 series · 2 of 2 positions shown · non-contrast
Comparison: July 30, 2016

CLINICAL DATA: Chest pain

EXAM:
CHEST - 2 VIEW

[w chest pa]
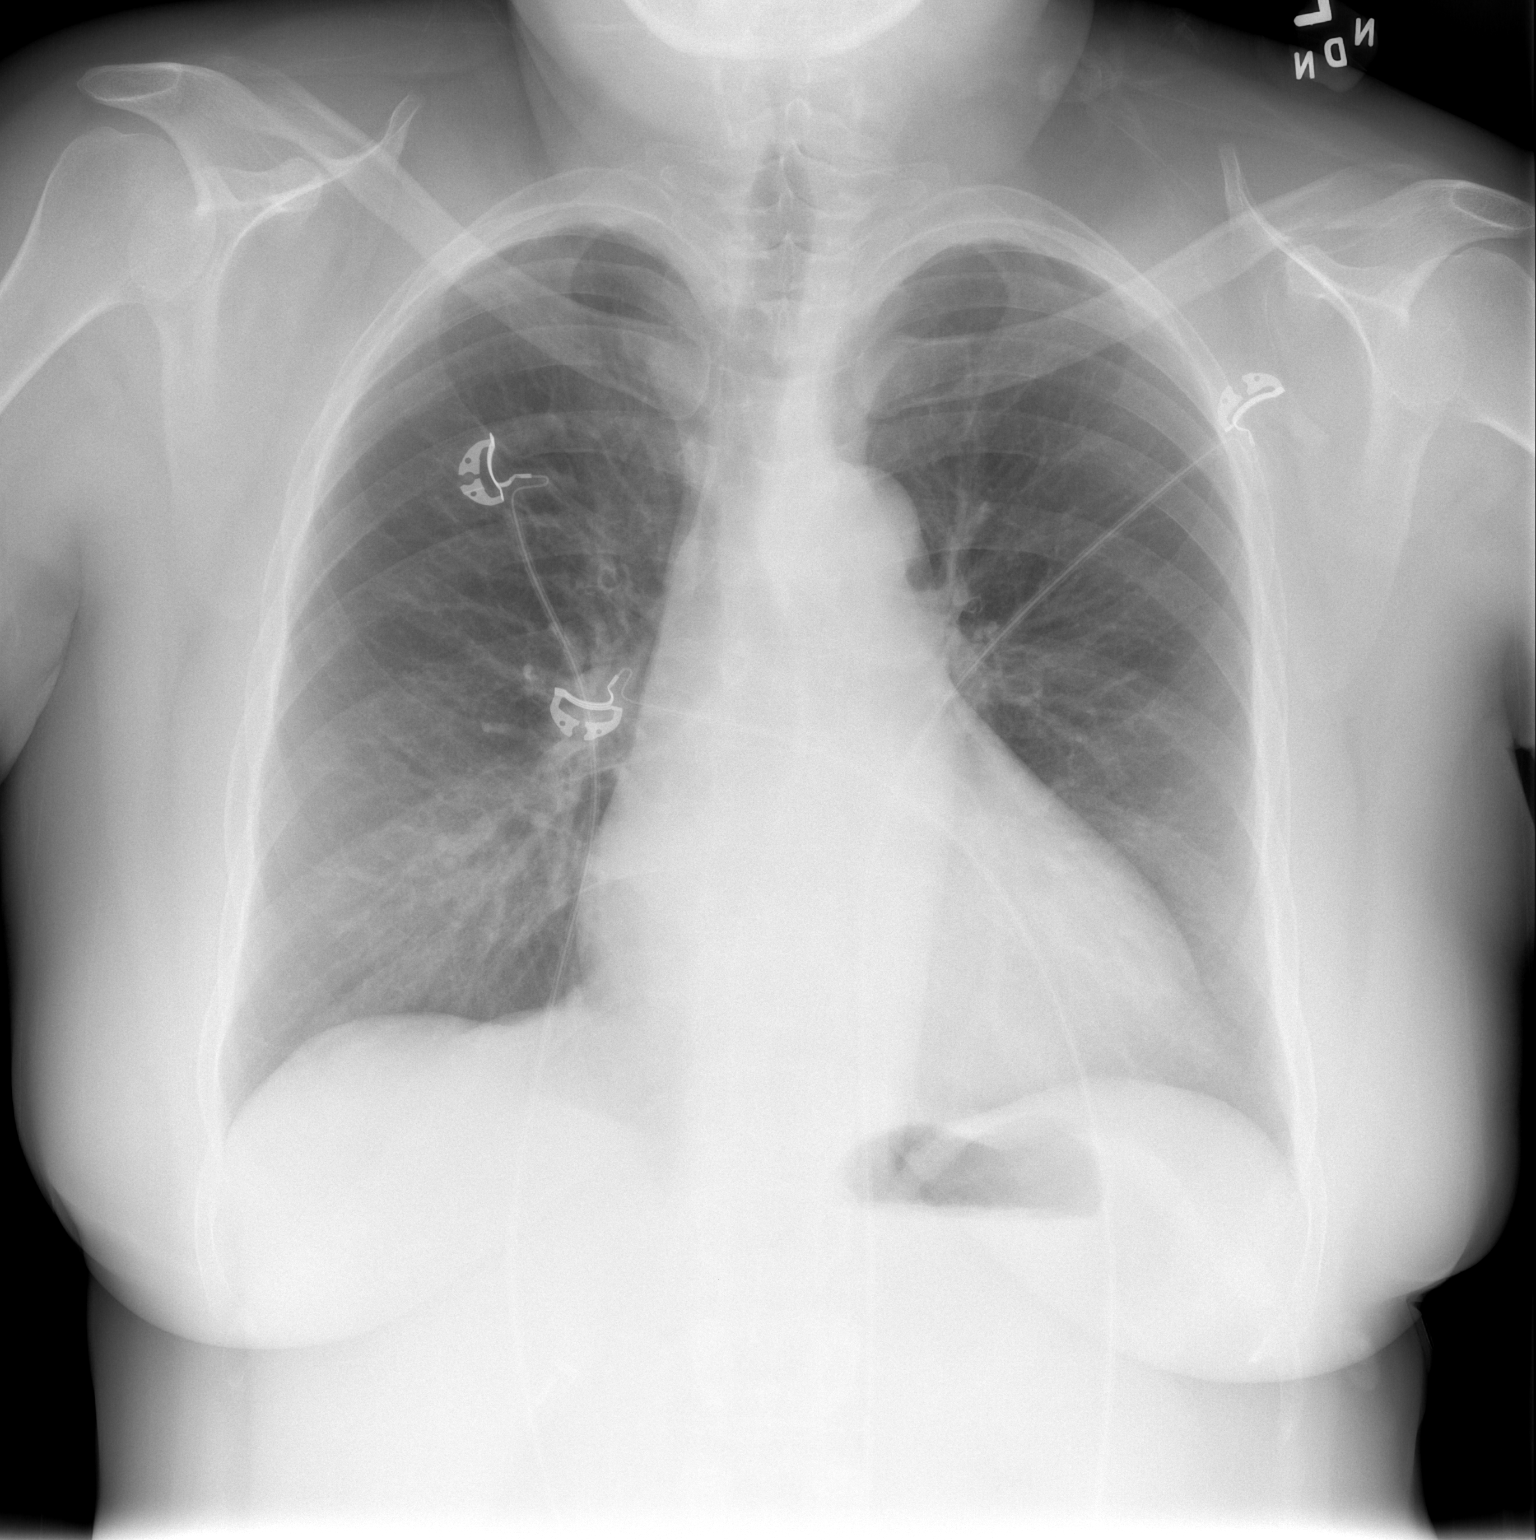

[w chest lat *]
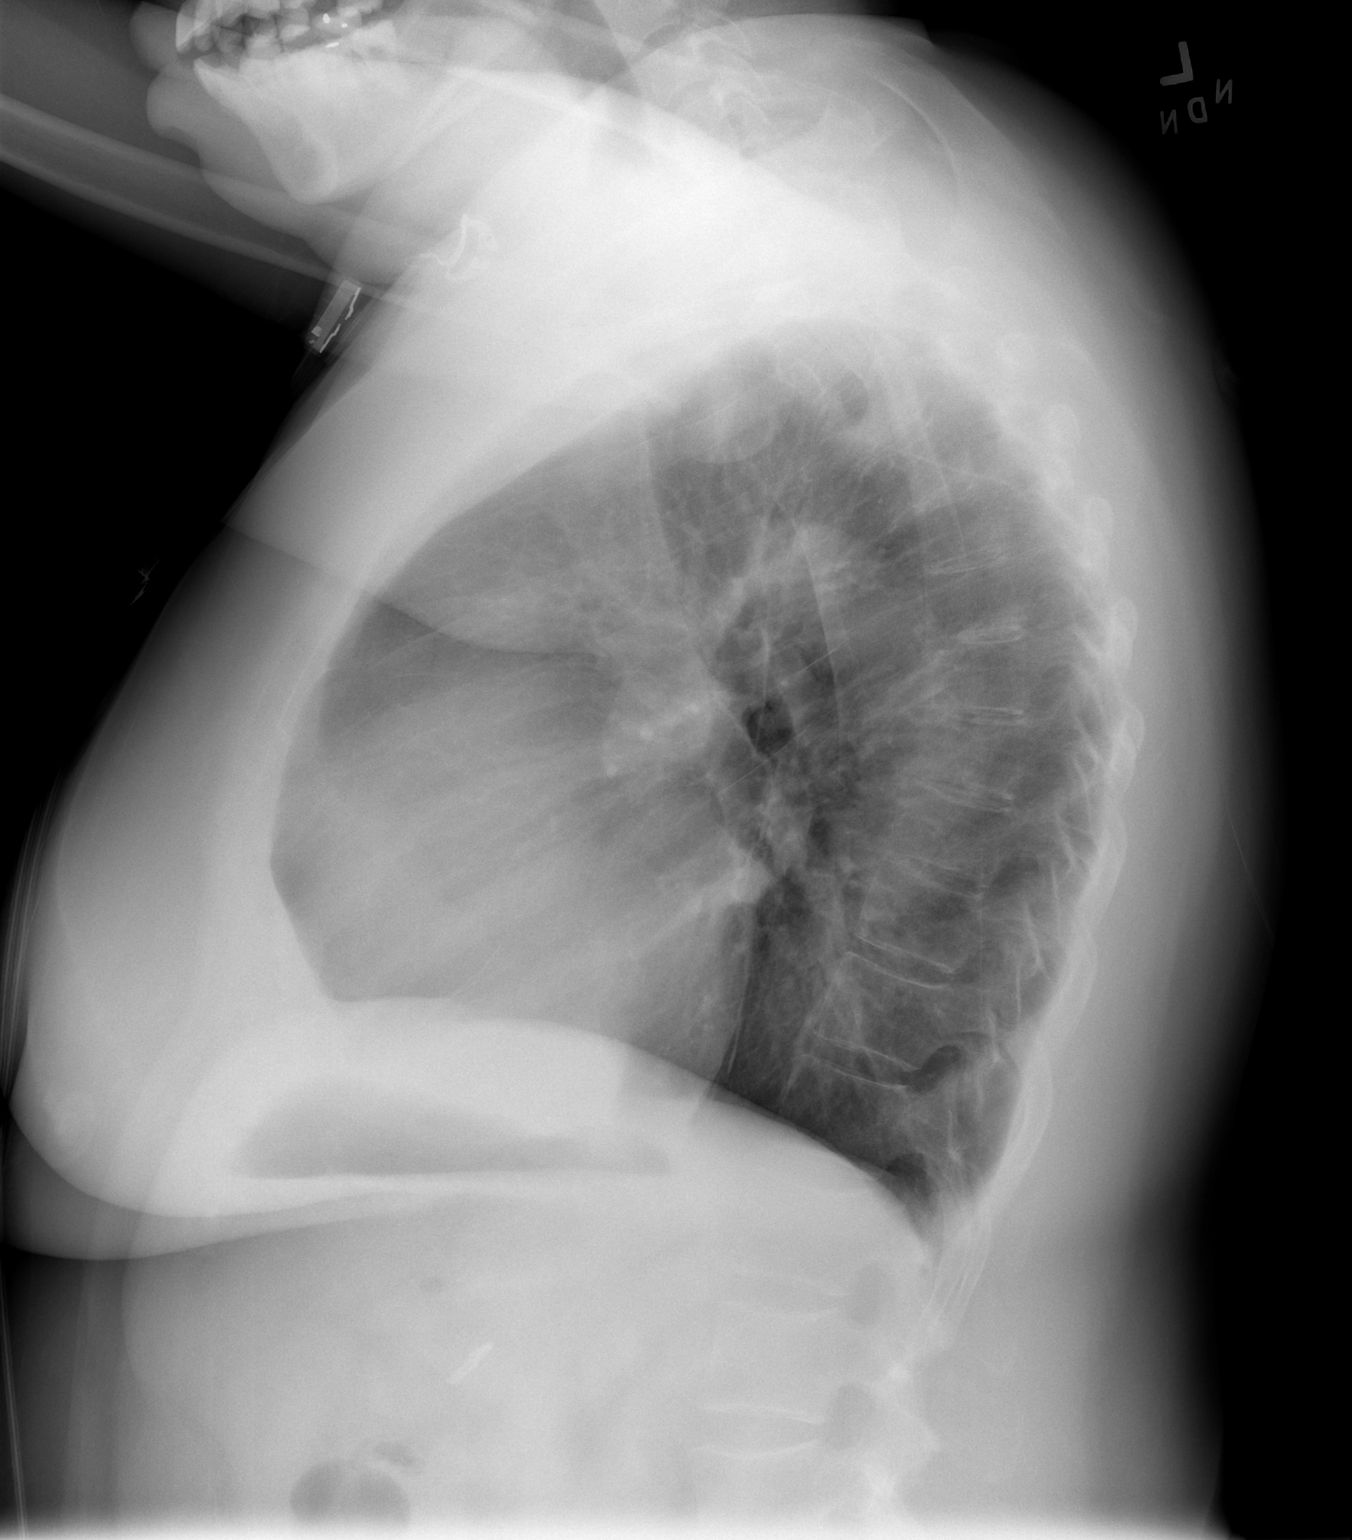

[2 of 2 positions shown; findings below may reference images not displayed]

FINDINGS: There is no edema or consolidation. Heart is borderline enlarged
with pulmonary vascularity normal. No adenopathy. No bone lesions.
IMPRESSION: Heart borderline enlarged.  No edema or consolidation.

## 2020-03-11 DIAGNOSIS — Z23 Encounter for immunization: Secondary | ICD-10-CM | POA: Diagnosis not present

## 2020-03-11 DIAGNOSIS — N186 End stage renal disease: Secondary | ICD-10-CM | POA: Diagnosis not present

## 2020-03-11 DIAGNOSIS — D631 Anemia in chronic kidney disease: Secondary | ICD-10-CM | POA: Diagnosis not present

## 2020-03-12 DIAGNOSIS — N186 End stage renal disease: Secondary | ICD-10-CM | POA: Diagnosis not present

## 2020-03-12 DIAGNOSIS — D631 Anemia in chronic kidney disease: Secondary | ICD-10-CM | POA: Diagnosis not present

## 2020-03-12 DIAGNOSIS — Z23 Encounter for immunization: Secondary | ICD-10-CM | POA: Diagnosis not present

## 2020-03-15 DIAGNOSIS — Z23 Encounter for immunization: Secondary | ICD-10-CM | POA: Diagnosis not present

## 2020-03-15 DIAGNOSIS — N186 End stage renal disease: Secondary | ICD-10-CM | POA: Diagnosis not present

## 2020-03-15 DIAGNOSIS — D631 Anemia in chronic kidney disease: Secondary | ICD-10-CM | POA: Diagnosis not present

## 2020-03-16 DIAGNOSIS — M79671 Pain in right foot: Secondary | ICD-10-CM | POA: Diagnosis not present

## 2020-03-16 DIAGNOSIS — M722 Plantar fascial fibromatosis: Secondary | ICD-10-CM | POA: Diagnosis not present

## 2020-03-16 DIAGNOSIS — M79672 Pain in left foot: Secondary | ICD-10-CM | POA: Diagnosis not present

## 2020-03-17 DIAGNOSIS — N186 End stage renal disease: Secondary | ICD-10-CM | POA: Diagnosis not present

## 2020-03-17 DIAGNOSIS — D631 Anemia in chronic kidney disease: Secondary | ICD-10-CM | POA: Diagnosis not present

## 2020-03-17 DIAGNOSIS — Z23 Encounter for immunization: Secondary | ICD-10-CM | POA: Diagnosis not present

## 2020-03-19 DIAGNOSIS — N186 End stage renal disease: Secondary | ICD-10-CM | POA: Diagnosis not present

## 2020-03-19 DIAGNOSIS — D631 Anemia in chronic kidney disease: Secondary | ICD-10-CM | POA: Diagnosis not present

## 2020-03-19 DIAGNOSIS — Z23 Encounter for immunization: Secondary | ICD-10-CM | POA: Diagnosis not present

## 2020-03-22 DIAGNOSIS — D631 Anemia in chronic kidney disease: Secondary | ICD-10-CM | POA: Diagnosis not present

## 2020-03-22 DIAGNOSIS — Z23 Encounter for immunization: Secondary | ICD-10-CM | POA: Diagnosis not present

## 2020-03-22 DIAGNOSIS — N186 End stage renal disease: Secondary | ICD-10-CM | POA: Diagnosis not present

## 2020-03-24 DIAGNOSIS — D631 Anemia in chronic kidney disease: Secondary | ICD-10-CM | POA: Diagnosis not present

## 2020-03-24 DIAGNOSIS — Z23 Encounter for immunization: Secondary | ICD-10-CM | POA: Diagnosis not present

## 2020-03-24 DIAGNOSIS — N186 End stage renal disease: Secondary | ICD-10-CM | POA: Diagnosis not present

## 2020-03-26 DIAGNOSIS — D631 Anemia in chronic kidney disease: Secondary | ICD-10-CM | POA: Diagnosis not present

## 2020-03-26 DIAGNOSIS — N186 End stage renal disease: Secondary | ICD-10-CM | POA: Diagnosis not present

## 2020-03-26 DIAGNOSIS — Z23 Encounter for immunization: Secondary | ICD-10-CM | POA: Diagnosis not present

## 2020-03-29 DIAGNOSIS — Z23 Encounter for immunization: Secondary | ICD-10-CM | POA: Diagnosis not present

## 2020-03-29 DIAGNOSIS — D631 Anemia in chronic kidney disease: Secondary | ICD-10-CM | POA: Diagnosis not present

## 2020-03-29 DIAGNOSIS — N186 End stage renal disease: Secondary | ICD-10-CM | POA: Diagnosis not present

## 2020-03-31 DIAGNOSIS — N186 End stage renal disease: Secondary | ICD-10-CM | POA: Diagnosis not present

## 2020-03-31 DIAGNOSIS — D631 Anemia in chronic kidney disease: Secondary | ICD-10-CM | POA: Diagnosis not present

## 2020-03-31 DIAGNOSIS — Z23 Encounter for immunization: Secondary | ICD-10-CM | POA: Diagnosis not present

## 2020-04-02 DIAGNOSIS — D631 Anemia in chronic kidney disease: Secondary | ICD-10-CM | POA: Diagnosis not present

## 2020-04-02 DIAGNOSIS — Z23 Encounter for immunization: Secondary | ICD-10-CM | POA: Diagnosis not present

## 2020-04-02 DIAGNOSIS — Z992 Dependence on renal dialysis: Secondary | ICD-10-CM | POA: Diagnosis not present

## 2020-04-02 DIAGNOSIS — N186 End stage renal disease: Secondary | ICD-10-CM | POA: Diagnosis not present

## 2020-04-05 DIAGNOSIS — Z23 Encounter for immunization: Secondary | ICD-10-CM | POA: Diagnosis not present

## 2020-04-05 DIAGNOSIS — N186 End stage renal disease: Secondary | ICD-10-CM | POA: Diagnosis not present

## 2020-04-05 DIAGNOSIS — D631 Anemia in chronic kidney disease: Secondary | ICD-10-CM | POA: Diagnosis not present

## 2020-04-05 DIAGNOSIS — E785 Hyperlipidemia, unspecified: Secondary | ICD-10-CM | POA: Diagnosis not present

## 2020-04-05 DIAGNOSIS — N2581 Secondary hyperparathyroidism of renal origin: Secondary | ICD-10-CM | POA: Diagnosis not present

## 2020-04-07 DIAGNOSIS — N2581 Secondary hyperparathyroidism of renal origin: Secondary | ICD-10-CM | POA: Diagnosis not present

## 2020-04-07 DIAGNOSIS — E785 Hyperlipidemia, unspecified: Secondary | ICD-10-CM | POA: Diagnosis not present

## 2020-04-07 DIAGNOSIS — D631 Anemia in chronic kidney disease: Secondary | ICD-10-CM | POA: Diagnosis not present

## 2020-04-07 DIAGNOSIS — M109 Gout, unspecified: Secondary | ICD-10-CM | POA: Diagnosis not present

## 2020-04-07 DIAGNOSIS — Z23 Encounter for immunization: Secondary | ICD-10-CM | POA: Diagnosis not present

## 2020-04-07 DIAGNOSIS — N186 End stage renal disease: Secondary | ICD-10-CM | POA: Diagnosis not present

## 2020-04-07 IMAGING — CT CT ABD-PELV W/O CM
2 of 4 series · 16 of 46 positions shown, 18 images · non-contrast
Comparison: 03/24/2014

CLINICAL DATA: Diarrhea and nausea for 1 week. History of prior
bowel obstruction.

EXAM:
CT ABDOMEN AND PELVIS WITHOUT CONTRAST
TECHNIQUE: Multidetector CT imaging of the abdomen and pelvis was performed
following the standard protocol without IV contrast.

[Series 3: ap without · axial · non-contrast · 0.68mm/px · z∈[-855,-470]mm · 13 of 87 slices shown, 15 images]
[im 5/87  soft-tissue]
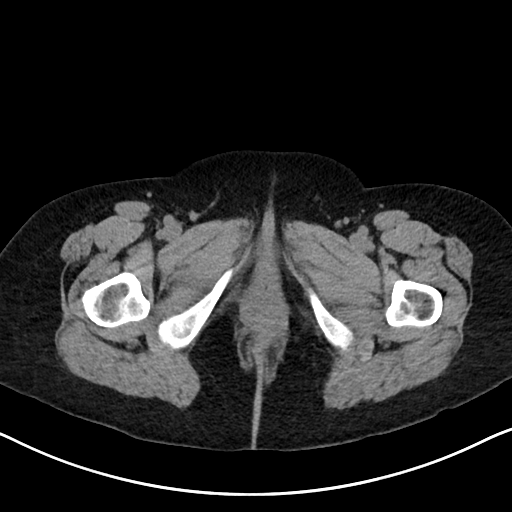
[im 5/87  bone]
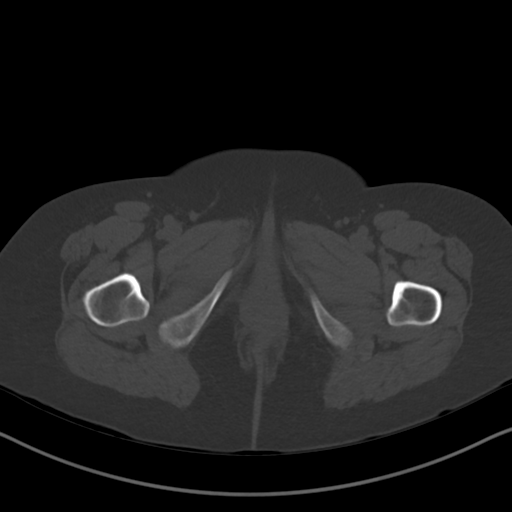
[im 14/87  soft-tissue]
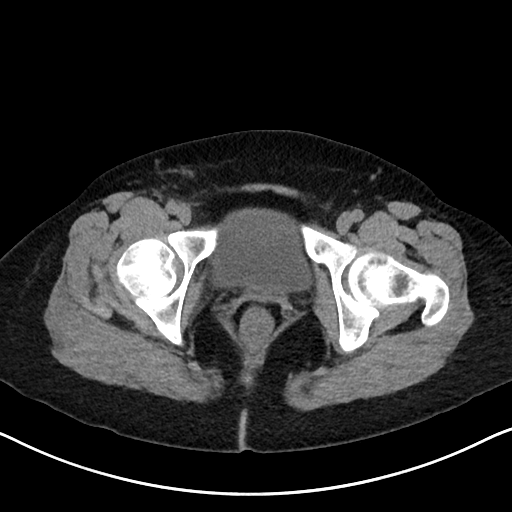
[im 19/87  soft-tissue]
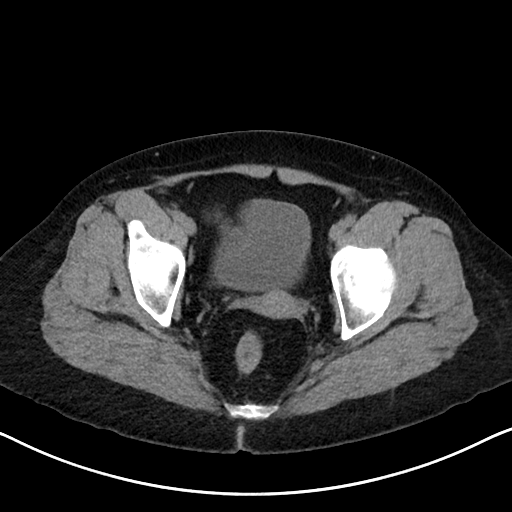
[im 23/87  soft-tissue]
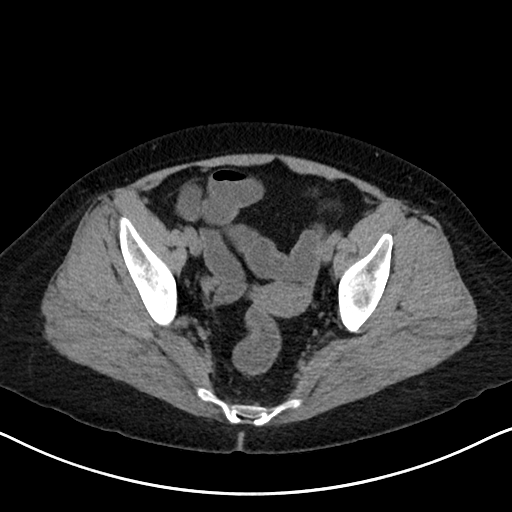
[im 32/87  soft-tissue]
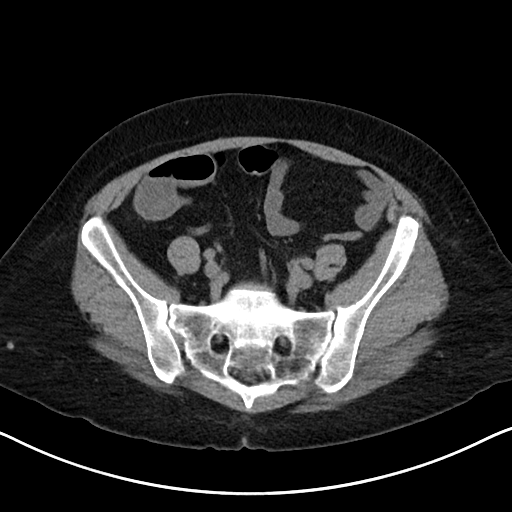
[im 37/87  soft-tissue]
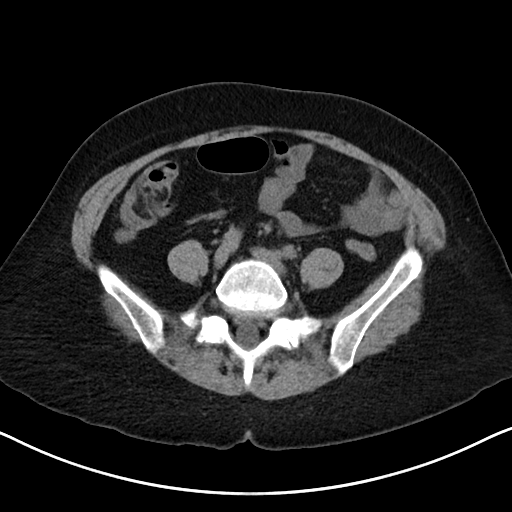
[im 46/87  soft-tissue]
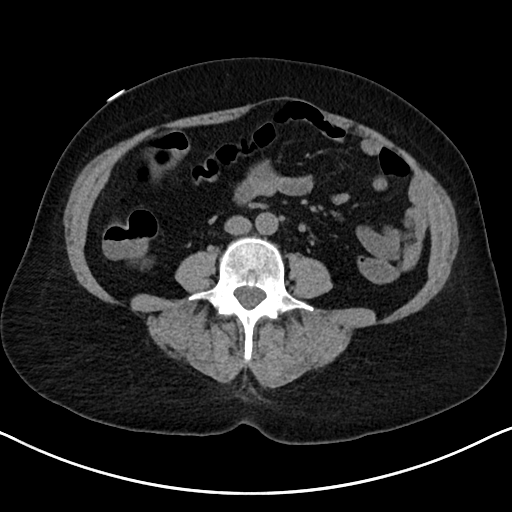
[im 50/87  soft-tissue]
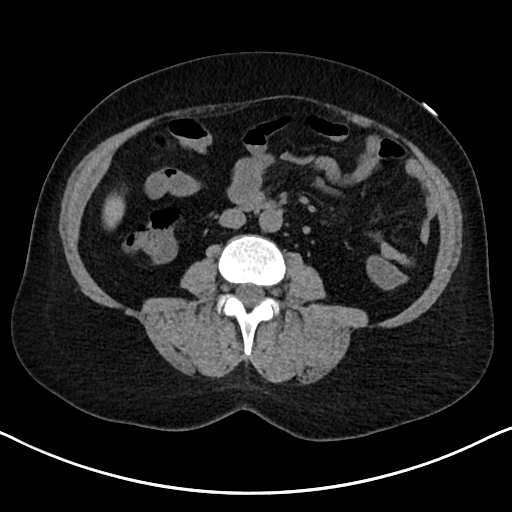
[im 55/87  soft-tissue]
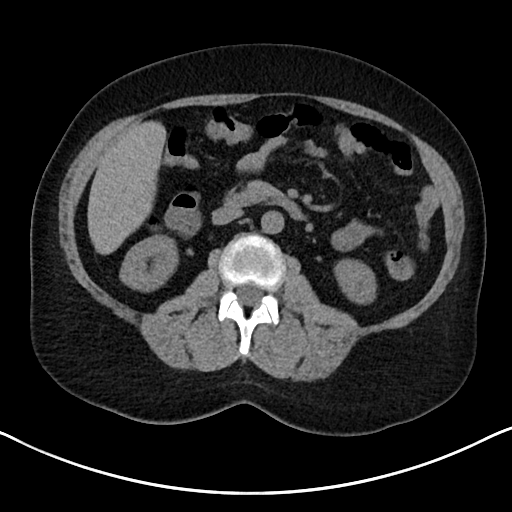
[im 55/87  bone]
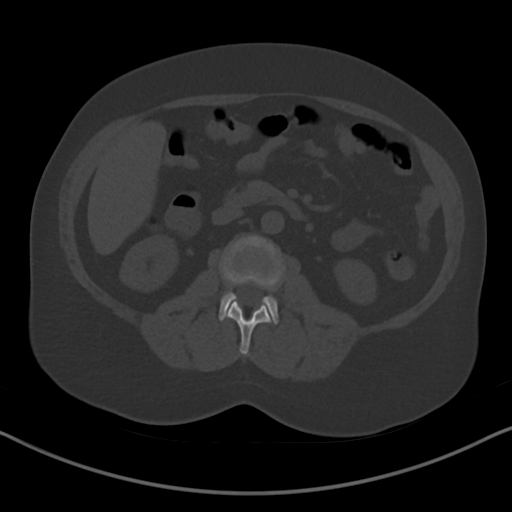
[im 64/87  soft-tissue]
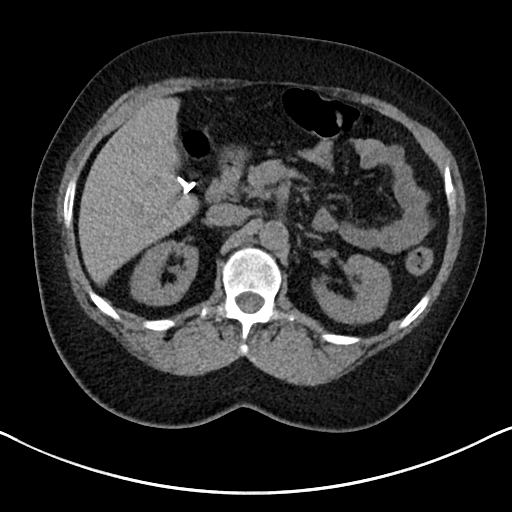
[im 68/87  soft-tissue]
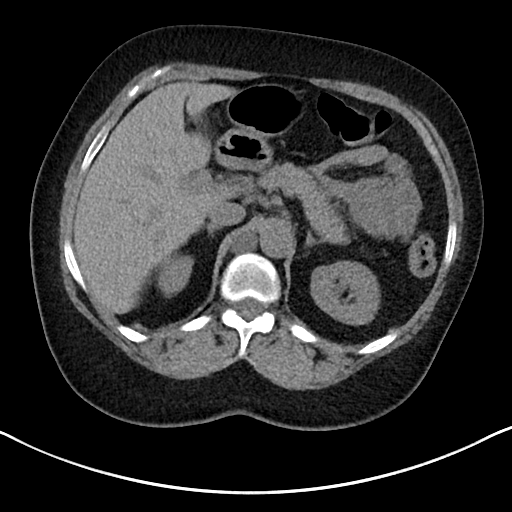
[im 73/87  soft-tissue]
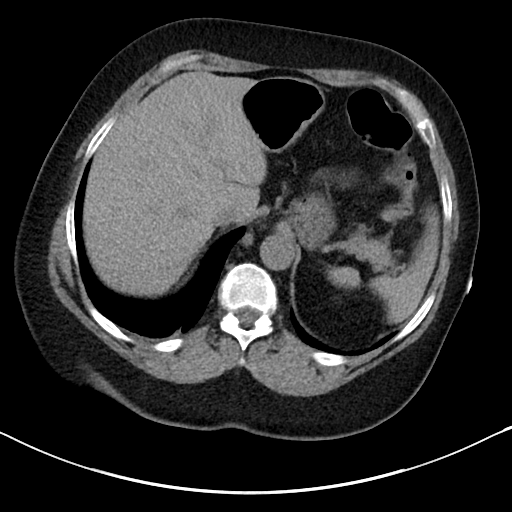
[im 82/87  soft-tissue]
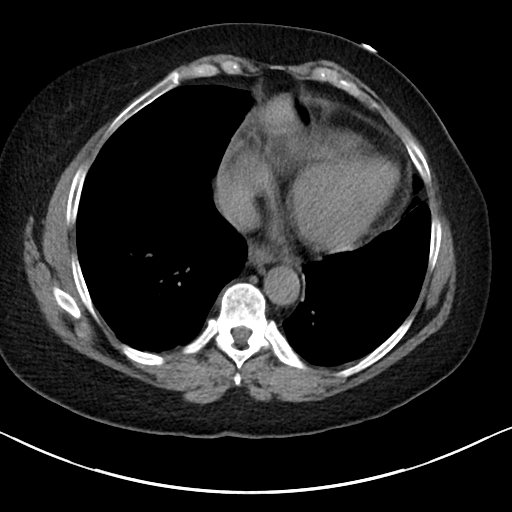

[Series 6: cor · coronal · 0.70mm/px · 3 of 78 slices shown]
[im 26/78  soft-tissue]
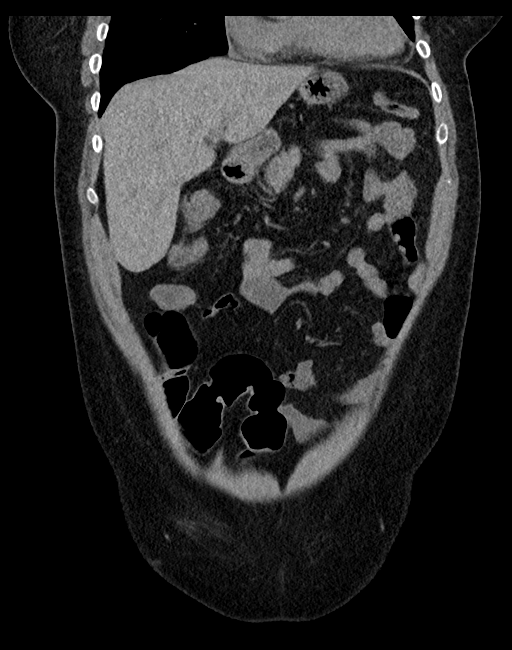
[im 35/78  soft-tissue]
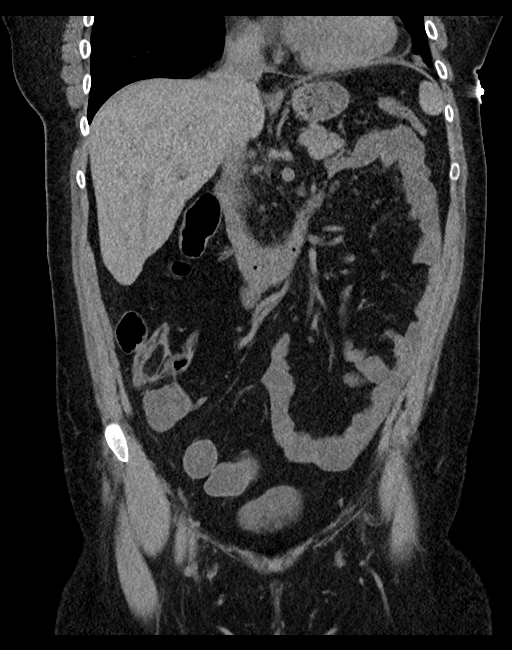
[im 43/78  soft-tissue]
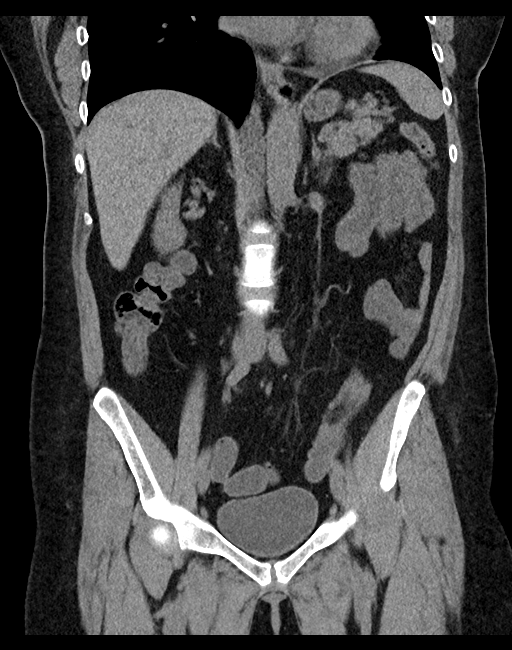

[16 of 46 positions shown; findings below may reference images not displayed]

FINDINGS: Lower chest: Lung bases are normal. Calcified plaque over the
lateral circumflex coronary artery. Tiny amount of pericardial
fluid.

Hepatobiliary: Previous cholecystectomy. Liver and biliary tree are
otherwise unremarkable.

Pancreas: Normal.

Spleen: Several small calcified granulomas, otherwise normal.

Adrenals/Urinary Tract: Adrenal glands are normal and symmetric.
Kidneys are normal in size without hydronephrosis or
nephrolithiasis. Ureters are normal caliber without stones. Bladder
is normal.

Stomach/Bowel: Stomach and small bowel are normal. Appendix is
normal. Colon is normal

Vascular/Lymphatic: Vascular structures are within normal. Stable
1.8 cm low-density nodule in the right retrocrural space likely a
varix.

Reproductive: Normal.

Other: No free fluid or focal inflammatory change.

Musculoskeletal: Normal.
IMPRESSION: No acute findings in the abdomen/pelvis.

## 2020-04-09 DIAGNOSIS — Z23 Encounter for immunization: Secondary | ICD-10-CM | POA: Diagnosis not present

## 2020-04-09 DIAGNOSIS — N2581 Secondary hyperparathyroidism of renal origin: Secondary | ICD-10-CM | POA: Diagnosis not present

## 2020-04-09 DIAGNOSIS — E785 Hyperlipidemia, unspecified: Secondary | ICD-10-CM | POA: Diagnosis not present

## 2020-04-09 DIAGNOSIS — N186 End stage renal disease: Secondary | ICD-10-CM | POA: Diagnosis not present

## 2020-04-09 DIAGNOSIS — D631 Anemia in chronic kidney disease: Secondary | ICD-10-CM | POA: Diagnosis not present

## 2020-04-12 DIAGNOSIS — D631 Anemia in chronic kidney disease: Secondary | ICD-10-CM | POA: Diagnosis not present

## 2020-04-12 DIAGNOSIS — N186 End stage renal disease: Secondary | ICD-10-CM | POA: Diagnosis not present

## 2020-04-12 DIAGNOSIS — N2581 Secondary hyperparathyroidism of renal origin: Secondary | ICD-10-CM | POA: Diagnosis not present

## 2020-04-12 DIAGNOSIS — Z23 Encounter for immunization: Secondary | ICD-10-CM | POA: Diagnosis not present

## 2020-04-12 DIAGNOSIS — E785 Hyperlipidemia, unspecified: Secondary | ICD-10-CM | POA: Diagnosis not present

## 2020-04-14 DIAGNOSIS — E785 Hyperlipidemia, unspecified: Secondary | ICD-10-CM | POA: Diagnosis not present

## 2020-04-14 DIAGNOSIS — D631 Anemia in chronic kidney disease: Secondary | ICD-10-CM | POA: Diagnosis not present

## 2020-04-14 DIAGNOSIS — Z23 Encounter for immunization: Secondary | ICD-10-CM | POA: Diagnosis not present

## 2020-04-14 DIAGNOSIS — N2581 Secondary hyperparathyroidism of renal origin: Secondary | ICD-10-CM | POA: Diagnosis not present

## 2020-04-14 DIAGNOSIS — N186 End stage renal disease: Secondary | ICD-10-CM | POA: Diagnosis not present

## 2020-04-16 DIAGNOSIS — E785 Hyperlipidemia, unspecified: Secondary | ICD-10-CM | POA: Diagnosis not present

## 2020-04-16 DIAGNOSIS — Z23 Encounter for immunization: Secondary | ICD-10-CM | POA: Diagnosis not present

## 2020-04-16 DIAGNOSIS — D631 Anemia in chronic kidney disease: Secondary | ICD-10-CM | POA: Diagnosis not present

## 2020-04-16 DIAGNOSIS — N186 End stage renal disease: Secondary | ICD-10-CM | POA: Diagnosis not present

## 2020-04-16 DIAGNOSIS — N2581 Secondary hyperparathyroidism of renal origin: Secondary | ICD-10-CM | POA: Diagnosis not present

## 2020-04-19 DIAGNOSIS — E785 Hyperlipidemia, unspecified: Secondary | ICD-10-CM | POA: Diagnosis not present

## 2020-04-19 DIAGNOSIS — N2581 Secondary hyperparathyroidism of renal origin: Secondary | ICD-10-CM | POA: Diagnosis not present

## 2020-04-19 DIAGNOSIS — N186 End stage renal disease: Secondary | ICD-10-CM | POA: Diagnosis not present

## 2020-04-19 DIAGNOSIS — D631 Anemia in chronic kidney disease: Secondary | ICD-10-CM | POA: Diagnosis not present

## 2020-04-19 DIAGNOSIS — Z23 Encounter for immunization: Secondary | ICD-10-CM | POA: Diagnosis not present

## 2020-04-21 DIAGNOSIS — E785 Hyperlipidemia, unspecified: Secondary | ICD-10-CM | POA: Diagnosis not present

## 2020-04-21 DIAGNOSIS — N2581 Secondary hyperparathyroidism of renal origin: Secondary | ICD-10-CM | POA: Diagnosis not present

## 2020-04-21 DIAGNOSIS — Z23 Encounter for immunization: Secondary | ICD-10-CM | POA: Diagnosis not present

## 2020-04-21 DIAGNOSIS — D631 Anemia in chronic kidney disease: Secondary | ICD-10-CM | POA: Diagnosis not present

## 2020-04-21 DIAGNOSIS — N186 End stage renal disease: Secondary | ICD-10-CM | POA: Diagnosis not present

## 2020-04-22 DIAGNOSIS — M722 Plantar fascial fibromatosis: Secondary | ICD-10-CM | POA: Diagnosis not present

## 2020-04-23 DIAGNOSIS — N186 End stage renal disease: Secondary | ICD-10-CM | POA: Diagnosis not present

## 2020-04-23 DIAGNOSIS — N2581 Secondary hyperparathyroidism of renal origin: Secondary | ICD-10-CM | POA: Diagnosis not present

## 2020-04-23 DIAGNOSIS — Z23 Encounter for immunization: Secondary | ICD-10-CM | POA: Diagnosis not present

## 2020-04-23 DIAGNOSIS — E785 Hyperlipidemia, unspecified: Secondary | ICD-10-CM | POA: Diagnosis not present

## 2020-04-23 DIAGNOSIS — D631 Anemia in chronic kidney disease: Secondary | ICD-10-CM | POA: Diagnosis not present

## 2020-04-26 DIAGNOSIS — D631 Anemia in chronic kidney disease: Secondary | ICD-10-CM | POA: Diagnosis not present

## 2020-04-26 DIAGNOSIS — N186 End stage renal disease: Secondary | ICD-10-CM | POA: Diagnosis not present

## 2020-04-26 DIAGNOSIS — N2581 Secondary hyperparathyroidism of renal origin: Secondary | ICD-10-CM | POA: Diagnosis not present

## 2020-04-26 DIAGNOSIS — E785 Hyperlipidemia, unspecified: Secondary | ICD-10-CM | POA: Diagnosis not present

## 2020-04-26 DIAGNOSIS — Z23 Encounter for immunization: Secondary | ICD-10-CM | POA: Diagnosis not present

## 2020-04-28 DIAGNOSIS — Z1159 Encounter for screening for other viral diseases: Secondary | ICD-10-CM | POA: Diagnosis not present

## 2020-04-28 DIAGNOSIS — Z23 Encounter for immunization: Secondary | ICD-10-CM | POA: Diagnosis not present

## 2020-04-28 DIAGNOSIS — D631 Anemia in chronic kidney disease: Secondary | ICD-10-CM | POA: Diagnosis not present

## 2020-04-28 DIAGNOSIS — N186 End stage renal disease: Secondary | ICD-10-CM | POA: Diagnosis not present

## 2020-04-28 DIAGNOSIS — E785 Hyperlipidemia, unspecified: Secondary | ICD-10-CM | POA: Diagnosis not present

## 2020-04-28 DIAGNOSIS — N2581 Secondary hyperparathyroidism of renal origin: Secondary | ICD-10-CM | POA: Diagnosis not present

## 2020-04-30 DIAGNOSIS — Z23 Encounter for immunization: Secondary | ICD-10-CM | POA: Diagnosis not present

## 2020-04-30 DIAGNOSIS — E785 Hyperlipidemia, unspecified: Secondary | ICD-10-CM | POA: Diagnosis not present

## 2020-04-30 DIAGNOSIS — D631 Anemia in chronic kidney disease: Secondary | ICD-10-CM | POA: Diagnosis not present

## 2020-04-30 DIAGNOSIS — N186 End stage renal disease: Secondary | ICD-10-CM | POA: Diagnosis not present

## 2020-04-30 DIAGNOSIS — N2581 Secondary hyperparathyroidism of renal origin: Secondary | ICD-10-CM | POA: Diagnosis not present

## 2020-05-03 DIAGNOSIS — Z992 Dependence on renal dialysis: Secondary | ICD-10-CM | POA: Diagnosis not present

## 2020-05-03 DIAGNOSIS — D631 Anemia in chronic kidney disease: Secondary | ICD-10-CM | POA: Diagnosis not present

## 2020-05-03 DIAGNOSIS — H2513 Age-related nuclear cataract, bilateral: Secondary | ICD-10-CM | POA: Diagnosis not present

## 2020-05-03 DIAGNOSIS — E785 Hyperlipidemia, unspecified: Secondary | ICD-10-CM | POA: Diagnosis not present

## 2020-05-03 DIAGNOSIS — H353221 Exudative age-related macular degeneration, left eye, with active choroidal neovascularization: Secondary | ICD-10-CM | POA: Diagnosis not present

## 2020-05-03 DIAGNOSIS — H35713 Central serous chorioretinopathy, bilateral: Secondary | ICD-10-CM | POA: Diagnosis not present

## 2020-05-03 DIAGNOSIS — N186 End stage renal disease: Secondary | ICD-10-CM | POA: Diagnosis not present

## 2020-05-03 DIAGNOSIS — Z23 Encounter for immunization: Secondary | ICD-10-CM | POA: Diagnosis not present

## 2020-05-03 DIAGNOSIS — N2581 Secondary hyperparathyroidism of renal origin: Secondary | ICD-10-CM | POA: Diagnosis not present

## 2020-05-05 DIAGNOSIS — N186 End stage renal disease: Secondary | ICD-10-CM | POA: Diagnosis not present

## 2020-05-05 DIAGNOSIS — D631 Anemia in chronic kidney disease: Secondary | ICD-10-CM | POA: Diagnosis not present

## 2020-05-05 DIAGNOSIS — Z23 Encounter for immunization: Secondary | ICD-10-CM | POA: Diagnosis not present

## 2020-05-05 DIAGNOSIS — N2581 Secondary hyperparathyroidism of renal origin: Secondary | ICD-10-CM | POA: Diagnosis not present

## 2020-05-07 DIAGNOSIS — N2581 Secondary hyperparathyroidism of renal origin: Secondary | ICD-10-CM | POA: Diagnosis not present

## 2020-05-07 DIAGNOSIS — D631 Anemia in chronic kidney disease: Secondary | ICD-10-CM | POA: Diagnosis not present

## 2020-05-07 DIAGNOSIS — Z23 Encounter for immunization: Secondary | ICD-10-CM | POA: Diagnosis not present

## 2020-05-07 DIAGNOSIS — N186 End stage renal disease: Secondary | ICD-10-CM | POA: Diagnosis not present

## 2020-05-10 DIAGNOSIS — N186 End stage renal disease: Secondary | ICD-10-CM | POA: Diagnosis not present

## 2020-05-10 DIAGNOSIS — D631 Anemia in chronic kidney disease: Secondary | ICD-10-CM | POA: Diagnosis not present

## 2020-05-10 DIAGNOSIS — Z23 Encounter for immunization: Secondary | ICD-10-CM | POA: Diagnosis not present

## 2020-05-10 DIAGNOSIS — N2581 Secondary hyperparathyroidism of renal origin: Secondary | ICD-10-CM | POA: Diagnosis not present

## 2020-05-12 DIAGNOSIS — N2581 Secondary hyperparathyroidism of renal origin: Secondary | ICD-10-CM | POA: Diagnosis not present

## 2020-05-12 DIAGNOSIS — N186 End stage renal disease: Secondary | ICD-10-CM | POA: Diagnosis not present

## 2020-05-12 DIAGNOSIS — Z23 Encounter for immunization: Secondary | ICD-10-CM | POA: Diagnosis not present

## 2020-05-12 DIAGNOSIS — D631 Anemia in chronic kidney disease: Secondary | ICD-10-CM | POA: Diagnosis not present

## 2020-05-14 DIAGNOSIS — N2581 Secondary hyperparathyroidism of renal origin: Secondary | ICD-10-CM | POA: Diagnosis not present

## 2020-05-14 DIAGNOSIS — N186 End stage renal disease: Secondary | ICD-10-CM | POA: Diagnosis not present

## 2020-05-14 DIAGNOSIS — Z23 Encounter for immunization: Secondary | ICD-10-CM | POA: Diagnosis not present

## 2020-05-14 DIAGNOSIS — D631 Anemia in chronic kidney disease: Secondary | ICD-10-CM | POA: Diagnosis not present

## 2020-05-17 DIAGNOSIS — D631 Anemia in chronic kidney disease: Secondary | ICD-10-CM | POA: Diagnosis not present

## 2020-05-17 DIAGNOSIS — N186 End stage renal disease: Secondary | ICD-10-CM | POA: Diagnosis not present

## 2020-05-17 DIAGNOSIS — Z23 Encounter for immunization: Secondary | ICD-10-CM | POA: Diagnosis not present

## 2020-05-17 DIAGNOSIS — N2581 Secondary hyperparathyroidism of renal origin: Secondary | ICD-10-CM | POA: Diagnosis not present

## 2020-05-18 DIAGNOSIS — L280 Lichen simplex chronicus: Secondary | ICD-10-CM | POA: Diagnosis not present

## 2020-05-18 DIAGNOSIS — L299 Pruritus, unspecified: Secondary | ICD-10-CM | POA: Diagnosis not present

## 2020-05-19 DIAGNOSIS — N186 End stage renal disease: Secondary | ICD-10-CM | POA: Diagnosis not present

## 2020-05-19 DIAGNOSIS — Z1159 Encounter for screening for other viral diseases: Secondary | ICD-10-CM | POA: Diagnosis not present

## 2020-05-19 DIAGNOSIS — D631 Anemia in chronic kidney disease: Secondary | ICD-10-CM | POA: Diagnosis not present

## 2020-05-19 DIAGNOSIS — N2581 Secondary hyperparathyroidism of renal origin: Secondary | ICD-10-CM | POA: Diagnosis not present

## 2020-05-19 DIAGNOSIS — Z23 Encounter for immunization: Secondary | ICD-10-CM | POA: Diagnosis not present

## 2020-05-21 DIAGNOSIS — D631 Anemia in chronic kidney disease: Secondary | ICD-10-CM | POA: Diagnosis not present

## 2020-05-21 DIAGNOSIS — N2581 Secondary hyperparathyroidism of renal origin: Secondary | ICD-10-CM | POA: Diagnosis not present

## 2020-05-21 DIAGNOSIS — Z23 Encounter for immunization: Secondary | ICD-10-CM | POA: Diagnosis not present

## 2020-05-21 DIAGNOSIS — N186 End stage renal disease: Secondary | ICD-10-CM | POA: Diagnosis not present

## 2020-05-24 DIAGNOSIS — H35713 Central serous chorioretinopathy, bilateral: Secondary | ICD-10-CM | POA: Diagnosis not present

## 2020-05-24 DIAGNOSIS — D631 Anemia in chronic kidney disease: Secondary | ICD-10-CM | POA: Diagnosis not present

## 2020-05-24 DIAGNOSIS — H353221 Exudative age-related macular degeneration, left eye, with active choroidal neovascularization: Secondary | ICD-10-CM | POA: Diagnosis not present

## 2020-05-24 DIAGNOSIS — N2581 Secondary hyperparathyroidism of renal origin: Secondary | ICD-10-CM | POA: Diagnosis not present

## 2020-05-24 DIAGNOSIS — H2513 Age-related nuclear cataract, bilateral: Secondary | ICD-10-CM | POA: Diagnosis not present

## 2020-05-24 DIAGNOSIS — N186 End stage renal disease: Secondary | ICD-10-CM | POA: Diagnosis not present

## 2020-05-24 DIAGNOSIS — Z23 Encounter for immunization: Secondary | ICD-10-CM | POA: Diagnosis not present

## 2020-05-26 DIAGNOSIS — Z23 Encounter for immunization: Secondary | ICD-10-CM | POA: Diagnosis not present

## 2020-05-26 DIAGNOSIS — N186 End stage renal disease: Secondary | ICD-10-CM | POA: Diagnosis not present

## 2020-05-26 DIAGNOSIS — D631 Anemia in chronic kidney disease: Secondary | ICD-10-CM | POA: Diagnosis not present

## 2020-05-26 DIAGNOSIS — N2581 Secondary hyperparathyroidism of renal origin: Secondary | ICD-10-CM | POA: Diagnosis not present

## 2020-05-28 DIAGNOSIS — Z23 Encounter for immunization: Secondary | ICD-10-CM | POA: Diagnosis not present

## 2020-05-28 DIAGNOSIS — D631 Anemia in chronic kidney disease: Secondary | ICD-10-CM | POA: Diagnosis not present

## 2020-05-28 DIAGNOSIS — N186 End stage renal disease: Secondary | ICD-10-CM | POA: Diagnosis not present

## 2020-05-28 DIAGNOSIS — N2581 Secondary hyperparathyroidism of renal origin: Secondary | ICD-10-CM | POA: Diagnosis not present

## 2020-05-31 DIAGNOSIS — N2581 Secondary hyperparathyroidism of renal origin: Secondary | ICD-10-CM | POA: Diagnosis not present

## 2020-05-31 DIAGNOSIS — N186 End stage renal disease: Secondary | ICD-10-CM | POA: Diagnosis not present

## 2020-05-31 DIAGNOSIS — Z992 Dependence on renal dialysis: Secondary | ICD-10-CM | POA: Diagnosis not present

## 2020-05-31 DIAGNOSIS — Z23 Encounter for immunization: Secondary | ICD-10-CM | POA: Diagnosis not present

## 2020-05-31 DIAGNOSIS — D631 Anemia in chronic kidney disease: Secondary | ICD-10-CM | POA: Diagnosis not present

## 2020-06-02 DIAGNOSIS — N186 End stage renal disease: Secondary | ICD-10-CM | POA: Diagnosis not present

## 2020-06-02 DIAGNOSIS — D631 Anemia in chronic kidney disease: Secondary | ICD-10-CM | POA: Diagnosis not present

## 2020-06-02 DIAGNOSIS — D509 Iron deficiency anemia, unspecified: Secondary | ICD-10-CM | POA: Diagnosis not present

## 2020-06-02 DIAGNOSIS — Z23 Encounter for immunization: Secondary | ICD-10-CM | POA: Diagnosis not present

## 2020-06-02 DIAGNOSIS — N2581 Secondary hyperparathyroidism of renal origin: Secondary | ICD-10-CM | POA: Diagnosis not present

## 2020-06-04 DIAGNOSIS — D509 Iron deficiency anemia, unspecified: Secondary | ICD-10-CM | POA: Diagnosis not present

## 2020-06-04 DIAGNOSIS — N186 End stage renal disease: Secondary | ICD-10-CM | POA: Diagnosis not present

## 2020-06-04 DIAGNOSIS — N2581 Secondary hyperparathyroidism of renal origin: Secondary | ICD-10-CM | POA: Diagnosis not present

## 2020-06-04 DIAGNOSIS — D631 Anemia in chronic kidney disease: Secondary | ICD-10-CM | POA: Diagnosis not present

## 2020-06-04 DIAGNOSIS — Z23 Encounter for immunization: Secondary | ICD-10-CM | POA: Diagnosis not present

## 2020-06-07 DIAGNOSIS — D509 Iron deficiency anemia, unspecified: Secondary | ICD-10-CM | POA: Diagnosis not present

## 2020-06-07 DIAGNOSIS — D631 Anemia in chronic kidney disease: Secondary | ICD-10-CM | POA: Diagnosis not present

## 2020-06-07 DIAGNOSIS — Z23 Encounter for immunization: Secondary | ICD-10-CM | POA: Diagnosis not present

## 2020-06-07 DIAGNOSIS — N186 End stage renal disease: Secondary | ICD-10-CM | POA: Diagnosis not present

## 2020-06-07 DIAGNOSIS — N2581 Secondary hyperparathyroidism of renal origin: Secondary | ICD-10-CM | POA: Diagnosis not present

## 2020-06-09 DIAGNOSIS — D509 Iron deficiency anemia, unspecified: Secondary | ICD-10-CM | POA: Diagnosis not present

## 2020-06-09 DIAGNOSIS — N186 End stage renal disease: Secondary | ICD-10-CM | POA: Diagnosis not present

## 2020-06-09 DIAGNOSIS — Z23 Encounter for immunization: Secondary | ICD-10-CM | POA: Diagnosis not present

## 2020-06-09 DIAGNOSIS — N2581 Secondary hyperparathyroidism of renal origin: Secondary | ICD-10-CM | POA: Diagnosis not present

## 2020-06-09 DIAGNOSIS — D631 Anemia in chronic kidney disease: Secondary | ICD-10-CM | POA: Diagnosis not present

## 2020-06-11 DIAGNOSIS — N2581 Secondary hyperparathyroidism of renal origin: Secondary | ICD-10-CM | POA: Diagnosis not present

## 2020-06-11 DIAGNOSIS — N186 End stage renal disease: Secondary | ICD-10-CM | POA: Diagnosis not present

## 2020-06-11 DIAGNOSIS — D509 Iron deficiency anemia, unspecified: Secondary | ICD-10-CM | POA: Diagnosis not present

## 2020-06-11 DIAGNOSIS — Z23 Encounter for immunization: Secondary | ICD-10-CM | POA: Diagnosis not present

## 2020-06-11 DIAGNOSIS — D631 Anemia in chronic kidney disease: Secondary | ICD-10-CM | POA: Diagnosis not present

## 2020-06-14 DIAGNOSIS — Z23 Encounter for immunization: Secondary | ICD-10-CM | POA: Diagnosis not present

## 2020-06-14 DIAGNOSIS — N2581 Secondary hyperparathyroidism of renal origin: Secondary | ICD-10-CM | POA: Diagnosis not present

## 2020-06-14 DIAGNOSIS — D631 Anemia in chronic kidney disease: Secondary | ICD-10-CM | POA: Diagnosis not present

## 2020-06-14 DIAGNOSIS — D509 Iron deficiency anemia, unspecified: Secondary | ICD-10-CM | POA: Diagnosis not present

## 2020-06-14 DIAGNOSIS — N186 End stage renal disease: Secondary | ICD-10-CM | POA: Diagnosis not present

## 2020-06-16 DIAGNOSIS — D631 Anemia in chronic kidney disease: Secondary | ICD-10-CM | POA: Diagnosis not present

## 2020-06-16 DIAGNOSIS — N2581 Secondary hyperparathyroidism of renal origin: Secondary | ICD-10-CM | POA: Diagnosis not present

## 2020-06-16 DIAGNOSIS — Z1159 Encounter for screening for other viral diseases: Secondary | ICD-10-CM | POA: Diagnosis not present

## 2020-06-16 DIAGNOSIS — Z23 Encounter for immunization: Secondary | ICD-10-CM | POA: Diagnosis not present

## 2020-06-16 DIAGNOSIS — D509 Iron deficiency anemia, unspecified: Secondary | ICD-10-CM | POA: Diagnosis not present

## 2020-06-16 DIAGNOSIS — H3322 Serous retinal detachment, left eye: Secondary | ICD-10-CM | POA: Diagnosis not present

## 2020-06-16 DIAGNOSIS — H2513 Age-related nuclear cataract, bilateral: Secondary | ICD-10-CM | POA: Diagnosis not present

## 2020-06-16 DIAGNOSIS — N186 End stage renal disease: Secondary | ICD-10-CM | POA: Diagnosis not present

## 2020-06-16 DIAGNOSIS — H3581 Retinal edema: Secondary | ICD-10-CM | POA: Diagnosis not present

## 2020-06-16 DIAGNOSIS — M329 Systemic lupus erythematosus, unspecified: Secondary | ICD-10-CM | POA: Diagnosis not present

## 2020-06-16 DIAGNOSIS — H353221 Exudative age-related macular degeneration, left eye, with active choroidal neovascularization: Secondary | ICD-10-CM | POA: Diagnosis not present

## 2020-06-18 DIAGNOSIS — Z23 Encounter for immunization: Secondary | ICD-10-CM | POA: Diagnosis not present

## 2020-06-18 DIAGNOSIS — D509 Iron deficiency anemia, unspecified: Secondary | ICD-10-CM | POA: Diagnosis not present

## 2020-06-18 DIAGNOSIS — N186 End stage renal disease: Secondary | ICD-10-CM | POA: Diagnosis not present

## 2020-06-18 DIAGNOSIS — D631 Anemia in chronic kidney disease: Secondary | ICD-10-CM | POA: Diagnosis not present

## 2020-06-18 DIAGNOSIS — N2581 Secondary hyperparathyroidism of renal origin: Secondary | ICD-10-CM | POA: Diagnosis not present

## 2020-06-21 DIAGNOSIS — N2581 Secondary hyperparathyroidism of renal origin: Secondary | ICD-10-CM | POA: Diagnosis not present

## 2020-06-21 DIAGNOSIS — D631 Anemia in chronic kidney disease: Secondary | ICD-10-CM | POA: Diagnosis not present

## 2020-06-21 DIAGNOSIS — Z23 Encounter for immunization: Secondary | ICD-10-CM | POA: Diagnosis not present

## 2020-06-21 DIAGNOSIS — D509 Iron deficiency anemia, unspecified: Secondary | ICD-10-CM | POA: Diagnosis not present

## 2020-06-21 DIAGNOSIS — N186 End stage renal disease: Secondary | ICD-10-CM | POA: Diagnosis not present

## 2020-06-23 DIAGNOSIS — D509 Iron deficiency anemia, unspecified: Secondary | ICD-10-CM | POA: Diagnosis not present

## 2020-06-23 DIAGNOSIS — Z23 Encounter for immunization: Secondary | ICD-10-CM | POA: Diagnosis not present

## 2020-06-23 DIAGNOSIS — N186 End stage renal disease: Secondary | ICD-10-CM | POA: Diagnosis not present

## 2020-06-23 DIAGNOSIS — N2581 Secondary hyperparathyroidism of renal origin: Secondary | ICD-10-CM | POA: Diagnosis not present

## 2020-06-23 DIAGNOSIS — D631 Anemia in chronic kidney disease: Secondary | ICD-10-CM | POA: Diagnosis not present

## 2020-06-24 DIAGNOSIS — H938X1 Other specified disorders of right ear: Secondary | ICD-10-CM | POA: Diagnosis not present

## 2020-06-24 DIAGNOSIS — H6121 Impacted cerumen, right ear: Secondary | ICD-10-CM | POA: Diagnosis not present

## 2020-06-25 DIAGNOSIS — D509 Iron deficiency anemia, unspecified: Secondary | ICD-10-CM | POA: Diagnosis not present

## 2020-06-25 DIAGNOSIS — N186 End stage renal disease: Secondary | ICD-10-CM | POA: Diagnosis not present

## 2020-06-25 DIAGNOSIS — D631 Anemia in chronic kidney disease: Secondary | ICD-10-CM | POA: Diagnosis not present

## 2020-06-25 DIAGNOSIS — N2581 Secondary hyperparathyroidism of renal origin: Secondary | ICD-10-CM | POA: Diagnosis not present

## 2020-06-25 DIAGNOSIS — Z23 Encounter for immunization: Secondary | ICD-10-CM | POA: Diagnosis not present

## 2020-06-28 DIAGNOSIS — D631 Anemia in chronic kidney disease: Secondary | ICD-10-CM | POA: Diagnosis not present

## 2020-06-28 DIAGNOSIS — Z23 Encounter for immunization: Secondary | ICD-10-CM | POA: Diagnosis not present

## 2020-06-28 DIAGNOSIS — N186 End stage renal disease: Secondary | ICD-10-CM | POA: Diagnosis not present

## 2020-06-28 DIAGNOSIS — N2581 Secondary hyperparathyroidism of renal origin: Secondary | ICD-10-CM | POA: Diagnosis not present

## 2020-06-28 DIAGNOSIS — D509 Iron deficiency anemia, unspecified: Secondary | ICD-10-CM | POA: Diagnosis not present

## 2020-06-30 DIAGNOSIS — N2581 Secondary hyperparathyroidism of renal origin: Secondary | ICD-10-CM | POA: Diagnosis not present

## 2020-06-30 DIAGNOSIS — Z23 Encounter for immunization: Secondary | ICD-10-CM | POA: Diagnosis not present

## 2020-06-30 DIAGNOSIS — D509 Iron deficiency anemia, unspecified: Secondary | ICD-10-CM | POA: Diagnosis not present

## 2020-06-30 DIAGNOSIS — N186 End stage renal disease: Secondary | ICD-10-CM | POA: Diagnosis not present

## 2020-06-30 DIAGNOSIS — D631 Anemia in chronic kidney disease: Secondary | ICD-10-CM | POA: Diagnosis not present

## 2020-07-01 DIAGNOSIS — N186 End stage renal disease: Secondary | ICD-10-CM | POA: Diagnosis not present

## 2020-07-01 DIAGNOSIS — Z992 Dependence on renal dialysis: Secondary | ICD-10-CM | POA: Diagnosis not present

## 2020-07-02 DIAGNOSIS — E785 Hyperlipidemia, unspecified: Secondary | ICD-10-CM | POA: Diagnosis not present

## 2020-07-02 DIAGNOSIS — D631 Anemia in chronic kidney disease: Secondary | ICD-10-CM | POA: Diagnosis not present

## 2020-07-02 DIAGNOSIS — N186 End stage renal disease: Secondary | ICD-10-CM | POA: Diagnosis not present

## 2020-07-02 DIAGNOSIS — D509 Iron deficiency anemia, unspecified: Secondary | ICD-10-CM | POA: Diagnosis not present

## 2020-07-02 DIAGNOSIS — N2581 Secondary hyperparathyroidism of renal origin: Secondary | ICD-10-CM | POA: Diagnosis not present

## 2020-07-05 DIAGNOSIS — D631 Anemia in chronic kidney disease: Secondary | ICD-10-CM | POA: Diagnosis not present

## 2020-07-05 DIAGNOSIS — E785 Hyperlipidemia, unspecified: Secondary | ICD-10-CM | POA: Diagnosis not present

## 2020-07-05 DIAGNOSIS — N186 End stage renal disease: Secondary | ICD-10-CM | POA: Diagnosis not present

## 2020-07-05 DIAGNOSIS — D509 Iron deficiency anemia, unspecified: Secondary | ICD-10-CM | POA: Diagnosis not present

## 2020-07-05 DIAGNOSIS — N2581 Secondary hyperparathyroidism of renal origin: Secondary | ICD-10-CM | POA: Diagnosis not present

## 2020-07-07 DIAGNOSIS — E785 Hyperlipidemia, unspecified: Secondary | ICD-10-CM | POA: Diagnosis not present

## 2020-07-07 DIAGNOSIS — D631 Anemia in chronic kidney disease: Secondary | ICD-10-CM | POA: Diagnosis not present

## 2020-07-07 DIAGNOSIS — N2581 Secondary hyperparathyroidism of renal origin: Secondary | ICD-10-CM | POA: Diagnosis not present

## 2020-07-07 DIAGNOSIS — D509 Iron deficiency anemia, unspecified: Secondary | ICD-10-CM | POA: Diagnosis not present

## 2020-07-07 DIAGNOSIS — N186 End stage renal disease: Secondary | ICD-10-CM | POA: Diagnosis not present

## 2020-07-09 ENCOUNTER — Telehealth: Payer: Self-pay | Admitting: Cardiovascular Disease

## 2020-07-09 DIAGNOSIS — N186 End stage renal disease: Secondary | ICD-10-CM | POA: Diagnosis not present

## 2020-07-09 DIAGNOSIS — D631 Anemia in chronic kidney disease: Secondary | ICD-10-CM | POA: Diagnosis not present

## 2020-07-09 DIAGNOSIS — N2581 Secondary hyperparathyroidism of renal origin: Secondary | ICD-10-CM | POA: Diagnosis not present

## 2020-07-09 DIAGNOSIS — E785 Hyperlipidemia, unspecified: Secondary | ICD-10-CM | POA: Diagnosis not present

## 2020-07-09 DIAGNOSIS — D509 Iron deficiency anemia, unspecified: Secondary | ICD-10-CM | POA: Diagnosis not present

## 2020-07-09 NOTE — Telephone Encounter (Addendum)
Pt called to repot that she has been struggling with her BP.   She says she spoke with Coletta Memos NP about it at her last OV in 02/2020.   She has dialysis Mon, Wed, Fri and 5 am.  She does not like to take her BO meds until later in the day those days because her systolic BP tends to drop and she gets very symptomatic.   She takes her meds on those days later in the evening but she is concerned that even though her top number gets low lately her bottom number has been staying around 90.   She says on her off of dialysis days  Her BP has been elevated but she cannot take her meds too late in the day because then it "preempts" her to a lower BP on the dialysis days. So she takes her meds at noon only.   She is getting frustrated with her BP bad the "lack" of schedule she is on and feels she needs to talk with someone... she is requesting Denyse Amass to help her get better med management.   She is seeing Dr. Gwenlyn Found 08/25/20 but she is struggling waiting that long she is in the process of being worked up for a transplant.   She plans to keep an accurate diary of her BP readings the next several days. She would like a virtual visit with Denyse Amass when she has those hoping for mid next week.   I am unable to find her an appt spot.   Will forward to his nurse for review.   (07/15/20 at 9:15 am?)   HER NUMBER [phone]

## 2020-07-09 NOTE — Telephone Encounter (Signed)
Pt c/o BP issue: STAT if pt c/o blurred vision, one-sided weakness or slurred speech  1. What are your last 5 BP readings?  135/94 138/100   2. Are you having any other symptoms (ex. Dizziness, headache, blurred vision, passed out)?  No  3. What is your BP issue?  Patient states her diastolic has been elevated today. She would like a call back to discuss what this means.

## 2020-07-12 DIAGNOSIS — D631 Anemia in chronic kidney disease: Secondary | ICD-10-CM | POA: Diagnosis not present

## 2020-07-12 DIAGNOSIS — N2581 Secondary hyperparathyroidism of renal origin: Secondary | ICD-10-CM | POA: Diagnosis not present

## 2020-07-12 DIAGNOSIS — N186 End stage renal disease: Secondary | ICD-10-CM | POA: Diagnosis not present

## 2020-07-12 DIAGNOSIS — E785 Hyperlipidemia, unspecified: Secondary | ICD-10-CM | POA: Diagnosis not present

## 2020-07-12 DIAGNOSIS — D509 Iron deficiency anemia, unspecified: Secondary | ICD-10-CM | POA: Diagnosis not present

## 2020-07-13 NOTE — Telephone Encounter (Signed)
Tried to call pt but "mailbox could not be found" to [facility]. Will try again later.

## 2020-07-14 DIAGNOSIS — N186 End stage renal disease: Secondary | ICD-10-CM | POA: Diagnosis not present

## 2020-07-14 DIAGNOSIS — E785 Hyperlipidemia, unspecified: Secondary | ICD-10-CM | POA: Diagnosis not present

## 2020-07-14 DIAGNOSIS — D631 Anemia in chronic kidney disease: Secondary | ICD-10-CM | POA: Diagnosis not present

## 2020-07-14 DIAGNOSIS — D509 Iron deficiency anemia, unspecified: Secondary | ICD-10-CM | POA: Diagnosis not present

## 2020-07-14 DIAGNOSIS — N2581 Secondary hyperparathyroidism of renal origin: Secondary | ICD-10-CM | POA: Diagnosis not present

## 2020-07-16 DIAGNOSIS — N2581 Secondary hyperparathyroidism of renal origin: Secondary | ICD-10-CM | POA: Diagnosis not present

## 2020-07-16 DIAGNOSIS — N186 End stage renal disease: Secondary | ICD-10-CM | POA: Diagnosis not present

## 2020-07-16 DIAGNOSIS — D509 Iron deficiency anemia, unspecified: Secondary | ICD-10-CM | POA: Diagnosis not present

## 2020-07-16 DIAGNOSIS — D631 Anemia in chronic kidney disease: Secondary | ICD-10-CM | POA: Diagnosis not present

## 2020-07-16 DIAGNOSIS — E785 Hyperlipidemia, unspecified: Secondary | ICD-10-CM | POA: Diagnosis not present

## 2020-07-19 DIAGNOSIS — D509 Iron deficiency anemia, unspecified: Secondary | ICD-10-CM | POA: Diagnosis not present

## 2020-07-19 DIAGNOSIS — N186 End stage renal disease: Secondary | ICD-10-CM | POA: Diagnosis not present

## 2020-07-19 DIAGNOSIS — N2581 Secondary hyperparathyroidism of renal origin: Secondary | ICD-10-CM | POA: Diagnosis not present

## 2020-07-19 DIAGNOSIS — E785 Hyperlipidemia, unspecified: Secondary | ICD-10-CM | POA: Diagnosis not present

## 2020-07-19 DIAGNOSIS — D631 Anemia in chronic kidney disease: Secondary | ICD-10-CM | POA: Diagnosis not present

## 2020-07-21 DIAGNOSIS — D509 Iron deficiency anemia, unspecified: Secondary | ICD-10-CM | POA: Diagnosis not present

## 2020-07-21 DIAGNOSIS — N2581 Secondary hyperparathyroidism of renal origin: Secondary | ICD-10-CM | POA: Diagnosis not present

## 2020-07-21 DIAGNOSIS — N186 End stage renal disease: Secondary | ICD-10-CM | POA: Diagnosis not present

## 2020-07-21 DIAGNOSIS — E785 Hyperlipidemia, unspecified: Secondary | ICD-10-CM | POA: Diagnosis not present

## 2020-07-21 DIAGNOSIS — D631 Anemia in chronic kidney disease: Secondary | ICD-10-CM | POA: Diagnosis not present

## 2020-07-23 DIAGNOSIS — E785 Hyperlipidemia, unspecified: Secondary | ICD-10-CM | POA: Diagnosis not present

## 2020-07-23 DIAGNOSIS — D631 Anemia in chronic kidney disease: Secondary | ICD-10-CM | POA: Diagnosis not present

## 2020-07-23 DIAGNOSIS — N2581 Secondary hyperparathyroidism of renal origin: Secondary | ICD-10-CM | POA: Diagnosis not present

## 2020-07-23 DIAGNOSIS — D509 Iron deficiency anemia, unspecified: Secondary | ICD-10-CM | POA: Diagnosis not present

## 2020-07-23 DIAGNOSIS — N186 End stage renal disease: Secondary | ICD-10-CM | POA: Diagnosis not present

## 2020-07-26 DIAGNOSIS — E785 Hyperlipidemia, unspecified: Secondary | ICD-10-CM | POA: Diagnosis not present

## 2020-07-26 DIAGNOSIS — N2581 Secondary hyperparathyroidism of renal origin: Secondary | ICD-10-CM | POA: Diagnosis not present

## 2020-07-26 DIAGNOSIS — D509 Iron deficiency anemia, unspecified: Secondary | ICD-10-CM | POA: Diagnosis not present

## 2020-07-26 DIAGNOSIS — N186 End stage renal disease: Secondary | ICD-10-CM | POA: Diagnosis not present

## 2020-07-26 DIAGNOSIS — D631 Anemia in chronic kidney disease: Secondary | ICD-10-CM | POA: Diagnosis not present

## 2020-07-27 NOTE — Telephone Encounter (Signed)
D/w Lindsey York pt should get BP med directions from Nephrology   Pt notified of directions. She states that she is following her BP and taking her BP and her medication. She is not taking BP medication on Dialysis days and the night before, her BP is 154/97 before dialysis, 146/95 during and 132/87 and 124/89 after. Pt is asking what is the time that the carvedilol stays in your system? Per Lindsey Amass, NP 7-10hours. Pt states that she is going to try to start taking her medication at 4am and 4pm and see how it goes. She will continue to take and log her BP and let us know how it goes and the directions from nephrology. [place] notified

## 2020-07-28 DIAGNOSIS — D631 Anemia in chronic kidney disease: Secondary | ICD-10-CM | POA: Diagnosis not present

## 2020-07-28 DIAGNOSIS — N2581 Secondary hyperparathyroidism of renal origin: Secondary | ICD-10-CM | POA: Diagnosis not present

## 2020-07-28 DIAGNOSIS — D509 Iron deficiency anemia, unspecified: Secondary | ICD-10-CM | POA: Diagnosis not present

## 2020-07-28 DIAGNOSIS — E785 Hyperlipidemia, unspecified: Secondary | ICD-10-CM | POA: Diagnosis not present

## 2020-07-28 DIAGNOSIS — N186 End stage renal disease: Secondary | ICD-10-CM | POA: Diagnosis not present

## 2020-07-30 DIAGNOSIS — N2581 Secondary hyperparathyroidism of renal origin: Secondary | ICD-10-CM | POA: Diagnosis not present

## 2020-07-30 DIAGNOSIS — D631 Anemia in chronic kidney disease: Secondary | ICD-10-CM | POA: Diagnosis not present

## 2020-07-30 DIAGNOSIS — D509 Iron deficiency anemia, unspecified: Secondary | ICD-10-CM | POA: Diagnosis not present

## 2020-07-30 DIAGNOSIS — E785 Hyperlipidemia, unspecified: Secondary | ICD-10-CM | POA: Diagnosis not present

## 2020-07-30 DIAGNOSIS — N186 End stage renal disease: Secondary | ICD-10-CM | POA: Diagnosis not present

## 2020-07-31 DIAGNOSIS — Z992 Dependence on renal dialysis: Secondary | ICD-10-CM | POA: Diagnosis not present

## 2020-07-31 DIAGNOSIS — N186 End stage renal disease: Secondary | ICD-10-CM | POA: Diagnosis not present

## 2020-08-02 DIAGNOSIS — N2581 Secondary hyperparathyroidism of renal origin: Secondary | ICD-10-CM | POA: Diagnosis not present

## 2020-08-02 DIAGNOSIS — D631 Anemia in chronic kidney disease: Secondary | ICD-10-CM | POA: Diagnosis not present

## 2020-08-02 DIAGNOSIS — Z23 Encounter for immunization: Secondary | ICD-10-CM | POA: Diagnosis not present

## 2020-08-02 DIAGNOSIS — D509 Iron deficiency anemia, unspecified: Secondary | ICD-10-CM | POA: Diagnosis not present

## 2020-08-02 DIAGNOSIS — N186 End stage renal disease: Secondary | ICD-10-CM | POA: Diagnosis not present

## 2020-08-04 DIAGNOSIS — N186 End stage renal disease: Secondary | ICD-10-CM | POA: Diagnosis not present

## 2020-08-04 DIAGNOSIS — Z23 Encounter for immunization: Secondary | ICD-10-CM | POA: Diagnosis not present

## 2020-08-04 DIAGNOSIS — D631 Anemia in chronic kidney disease: Secondary | ICD-10-CM | POA: Diagnosis not present

## 2020-08-04 DIAGNOSIS — N2581 Secondary hyperparathyroidism of renal origin: Secondary | ICD-10-CM | POA: Diagnosis not present

## 2020-08-04 DIAGNOSIS — M109 Gout, unspecified: Secondary | ICD-10-CM | POA: Diagnosis not present

## 2020-08-04 DIAGNOSIS — D509 Iron deficiency anemia, unspecified: Secondary | ICD-10-CM | POA: Diagnosis not present

## 2020-08-06 DIAGNOSIS — Z23 Encounter for immunization: Secondary | ICD-10-CM | POA: Diagnosis not present

## 2020-08-06 DIAGNOSIS — N2581 Secondary hyperparathyroidism of renal origin: Secondary | ICD-10-CM | POA: Diagnosis not present

## 2020-08-06 DIAGNOSIS — N186 End stage renal disease: Secondary | ICD-10-CM | POA: Diagnosis not present

## 2020-08-06 DIAGNOSIS — D509 Iron deficiency anemia, unspecified: Secondary | ICD-10-CM | POA: Diagnosis not present

## 2020-08-06 DIAGNOSIS — D631 Anemia in chronic kidney disease: Secondary | ICD-10-CM | POA: Diagnosis not present

## 2020-08-09 DIAGNOSIS — D509 Iron deficiency anemia, unspecified: Secondary | ICD-10-CM | POA: Diagnosis not present

## 2020-08-09 DIAGNOSIS — D631 Anemia in chronic kidney disease: Secondary | ICD-10-CM | POA: Diagnosis not present

## 2020-08-09 DIAGNOSIS — N2581 Secondary hyperparathyroidism of renal origin: Secondary | ICD-10-CM | POA: Diagnosis not present

## 2020-08-09 DIAGNOSIS — N186 End stage renal disease: Secondary | ICD-10-CM | POA: Diagnosis not present

## 2020-08-09 DIAGNOSIS — Z23 Encounter for immunization: Secondary | ICD-10-CM | POA: Diagnosis not present

## 2020-08-11 DIAGNOSIS — N186 End stage renal disease: Secondary | ICD-10-CM | POA: Diagnosis not present

## 2020-08-11 DIAGNOSIS — D509 Iron deficiency anemia, unspecified: Secondary | ICD-10-CM | POA: Diagnosis not present

## 2020-08-11 DIAGNOSIS — D631 Anemia in chronic kidney disease: Secondary | ICD-10-CM | POA: Diagnosis not present

## 2020-08-11 DIAGNOSIS — Z23 Encounter for immunization: Secondary | ICD-10-CM | POA: Diagnosis not present

## 2020-08-11 DIAGNOSIS — N2581 Secondary hyperparathyroidism of renal origin: Secondary | ICD-10-CM | POA: Diagnosis not present

## 2020-08-13 DIAGNOSIS — D631 Anemia in chronic kidney disease: Secondary | ICD-10-CM | POA: Diagnosis not present

## 2020-08-13 DIAGNOSIS — N2581 Secondary hyperparathyroidism of renal origin: Secondary | ICD-10-CM | POA: Diagnosis not present

## 2020-08-13 DIAGNOSIS — N186 End stage renal disease: Secondary | ICD-10-CM | POA: Diagnosis not present

## 2020-08-13 DIAGNOSIS — D509 Iron deficiency anemia, unspecified: Secondary | ICD-10-CM | POA: Diagnosis not present

## 2020-08-13 DIAGNOSIS — Z23 Encounter for immunization: Secondary | ICD-10-CM | POA: Diagnosis not present

## 2020-08-16 DIAGNOSIS — D631 Anemia in chronic kidney disease: Secondary | ICD-10-CM | POA: Diagnosis not present

## 2020-08-16 DIAGNOSIS — N2581 Secondary hyperparathyroidism of renal origin: Secondary | ICD-10-CM | POA: Diagnosis not present

## 2020-08-16 DIAGNOSIS — Z23 Encounter for immunization: Secondary | ICD-10-CM | POA: Diagnosis not present

## 2020-08-16 DIAGNOSIS — N186 End stage renal disease: Secondary | ICD-10-CM | POA: Diagnosis not present

## 2020-08-16 DIAGNOSIS — D509 Iron deficiency anemia, unspecified: Secondary | ICD-10-CM | POA: Diagnosis not present

## 2020-08-18 DIAGNOSIS — N186 End stage renal disease: Secondary | ICD-10-CM | POA: Diagnosis not present

## 2020-08-18 DIAGNOSIS — D631 Anemia in chronic kidney disease: Secondary | ICD-10-CM | POA: Diagnosis not present

## 2020-08-18 DIAGNOSIS — Z23 Encounter for immunization: Secondary | ICD-10-CM | POA: Diagnosis not present

## 2020-08-18 DIAGNOSIS — D509 Iron deficiency anemia, unspecified: Secondary | ICD-10-CM | POA: Diagnosis not present

## 2020-08-18 DIAGNOSIS — N2581 Secondary hyperparathyroidism of renal origin: Secondary | ICD-10-CM | POA: Diagnosis not present

## 2020-08-20 DIAGNOSIS — D631 Anemia in chronic kidney disease: Secondary | ICD-10-CM | POA: Diagnosis not present

## 2020-08-20 DIAGNOSIS — Z23 Encounter for immunization: Secondary | ICD-10-CM | POA: Diagnosis not present

## 2020-08-20 DIAGNOSIS — N186 End stage renal disease: Secondary | ICD-10-CM | POA: Diagnosis not present

## 2020-08-20 DIAGNOSIS — N2581 Secondary hyperparathyroidism of renal origin: Secondary | ICD-10-CM | POA: Diagnosis not present

## 2020-08-20 DIAGNOSIS — D509 Iron deficiency anemia, unspecified: Secondary | ICD-10-CM | POA: Diagnosis not present

## 2020-08-23 DIAGNOSIS — D509 Iron deficiency anemia, unspecified: Secondary | ICD-10-CM | POA: Diagnosis not present

## 2020-08-23 DIAGNOSIS — N186 End stage renal disease: Secondary | ICD-10-CM | POA: Diagnosis not present

## 2020-08-23 DIAGNOSIS — H2513 Age-related nuclear cataract, bilateral: Secondary | ICD-10-CM | POA: Diagnosis not present

## 2020-08-23 DIAGNOSIS — N2581 Secondary hyperparathyroidism of renal origin: Secondary | ICD-10-CM | POA: Diagnosis not present

## 2020-08-23 DIAGNOSIS — Z23 Encounter for immunization: Secondary | ICD-10-CM | POA: Diagnosis not present

## 2020-08-23 DIAGNOSIS — H353221 Exudative age-related macular degeneration, left eye, with active choroidal neovascularization: Secondary | ICD-10-CM | POA: Diagnosis not present

## 2020-08-23 DIAGNOSIS — H35713 Central serous chorioretinopathy, bilateral: Secondary | ICD-10-CM | POA: Diagnosis not present

## 2020-08-23 DIAGNOSIS — D631 Anemia in chronic kidney disease: Secondary | ICD-10-CM | POA: Diagnosis not present

## 2020-08-25 ENCOUNTER — Ambulatory Visit: Payer: Medicare Other | Admitting: Cardiovascular Disease

## 2020-08-25 DIAGNOSIS — Z23 Encounter for immunization: Secondary | ICD-10-CM | POA: Diagnosis not present

## 2020-08-25 DIAGNOSIS — N186 End stage renal disease: Secondary | ICD-10-CM | POA: Diagnosis not present

## 2020-08-25 DIAGNOSIS — D509 Iron deficiency anemia, unspecified: Secondary | ICD-10-CM | POA: Diagnosis not present

## 2020-08-25 DIAGNOSIS — D631 Anemia in chronic kidney disease: Secondary | ICD-10-CM | POA: Diagnosis not present

## 2020-08-25 DIAGNOSIS — N2581 Secondary hyperparathyroidism of renal origin: Secondary | ICD-10-CM | POA: Diagnosis not present

## 2020-08-27 DIAGNOSIS — Z23 Encounter for immunization: Secondary | ICD-10-CM | POA: Diagnosis not present

## 2020-08-27 DIAGNOSIS — N186 End stage renal disease: Secondary | ICD-10-CM | POA: Diagnosis not present

## 2020-08-27 DIAGNOSIS — N2581 Secondary hyperparathyroidism of renal origin: Secondary | ICD-10-CM | POA: Diagnosis not present

## 2020-08-27 DIAGNOSIS — D631 Anemia in chronic kidney disease: Secondary | ICD-10-CM | POA: Diagnosis not present

## 2020-08-27 DIAGNOSIS — D509 Iron deficiency anemia, unspecified: Secondary | ICD-10-CM | POA: Diagnosis not present

## 2020-08-30 DIAGNOSIS — D631 Anemia in chronic kidney disease: Secondary | ICD-10-CM | POA: Diagnosis not present

## 2020-08-30 DIAGNOSIS — D509 Iron deficiency anemia, unspecified: Secondary | ICD-10-CM | POA: Diagnosis not present

## 2020-08-30 DIAGNOSIS — N2581 Secondary hyperparathyroidism of renal origin: Secondary | ICD-10-CM | POA: Diagnosis not present

## 2020-08-30 DIAGNOSIS — N186 End stage renal disease: Secondary | ICD-10-CM | POA: Diagnosis not present

## 2020-08-30 DIAGNOSIS — Z23 Encounter for immunization: Secondary | ICD-10-CM | POA: Diagnosis not present

## 2020-08-31 DIAGNOSIS — Z992 Dependence on renal dialysis: Secondary | ICD-10-CM | POA: Diagnosis not present

## 2020-08-31 DIAGNOSIS — N186 End stage renal disease: Secondary | ICD-10-CM | POA: Diagnosis not present

## 2020-09-01 DIAGNOSIS — D631 Anemia in chronic kidney disease: Secondary | ICD-10-CM | POA: Diagnosis not present

## 2020-09-01 DIAGNOSIS — N186 End stage renal disease: Secondary | ICD-10-CM | POA: Diagnosis not present

## 2020-09-01 DIAGNOSIS — N2581 Secondary hyperparathyroidism of renal origin: Secondary | ICD-10-CM | POA: Diagnosis not present

## 2020-09-01 DIAGNOSIS — M109 Gout, unspecified: Secondary | ICD-10-CM | POA: Diagnosis not present

## 2020-09-01 DIAGNOSIS — D509 Iron deficiency anemia, unspecified: Secondary | ICD-10-CM | POA: Diagnosis not present

## 2020-09-03 DIAGNOSIS — N186 End stage renal disease: Secondary | ICD-10-CM | POA: Diagnosis not present

## 2020-09-03 DIAGNOSIS — D509 Iron deficiency anemia, unspecified: Secondary | ICD-10-CM | POA: Diagnosis not present

## 2020-09-03 DIAGNOSIS — D631 Anemia in chronic kidney disease: Secondary | ICD-10-CM | POA: Diagnosis not present

## 2020-09-03 DIAGNOSIS — N2581 Secondary hyperparathyroidism of renal origin: Secondary | ICD-10-CM | POA: Diagnosis not present

## 2020-09-06 DIAGNOSIS — N2581 Secondary hyperparathyroidism of renal origin: Secondary | ICD-10-CM | POA: Diagnosis not present

## 2020-09-06 DIAGNOSIS — D509 Iron deficiency anemia, unspecified: Secondary | ICD-10-CM | POA: Diagnosis not present

## 2020-09-06 DIAGNOSIS — D631 Anemia in chronic kidney disease: Secondary | ICD-10-CM | POA: Diagnosis not present

## 2020-09-06 DIAGNOSIS — N186 End stage renal disease: Secondary | ICD-10-CM | POA: Diagnosis not present

## 2020-09-08 DIAGNOSIS — N186 End stage renal disease: Secondary | ICD-10-CM | POA: Diagnosis not present

## 2020-09-08 DIAGNOSIS — N2581 Secondary hyperparathyroidism of renal origin: Secondary | ICD-10-CM | POA: Diagnosis not present

## 2020-09-08 DIAGNOSIS — D631 Anemia in chronic kidney disease: Secondary | ICD-10-CM | POA: Diagnosis not present

## 2020-09-08 DIAGNOSIS — D509 Iron deficiency anemia, unspecified: Secondary | ICD-10-CM | POA: Diagnosis not present

## 2020-09-10 DIAGNOSIS — D631 Anemia in chronic kidney disease: Secondary | ICD-10-CM | POA: Diagnosis not present

## 2020-09-10 DIAGNOSIS — N2581 Secondary hyperparathyroidism of renal origin: Secondary | ICD-10-CM | POA: Diagnosis not present

## 2020-09-10 DIAGNOSIS — N186 End stage renal disease: Secondary | ICD-10-CM | POA: Diagnosis not present

## 2020-09-10 DIAGNOSIS — D509 Iron deficiency anemia, unspecified: Secondary | ICD-10-CM | POA: Diagnosis not present

## 2020-09-13 DIAGNOSIS — N186 End stage renal disease: Secondary | ICD-10-CM | POA: Diagnosis not present

## 2020-09-13 DIAGNOSIS — D509 Iron deficiency anemia, unspecified: Secondary | ICD-10-CM | POA: Diagnosis not present

## 2020-09-13 DIAGNOSIS — N2581 Secondary hyperparathyroidism of renal origin: Secondary | ICD-10-CM | POA: Diagnosis not present

## 2020-09-13 DIAGNOSIS — D631 Anemia in chronic kidney disease: Secondary | ICD-10-CM | POA: Diagnosis not present

## 2020-09-15 DIAGNOSIS — D509 Iron deficiency anemia, unspecified: Secondary | ICD-10-CM | POA: Diagnosis not present

## 2020-09-15 DIAGNOSIS — D631 Anemia in chronic kidney disease: Secondary | ICD-10-CM | POA: Diagnosis not present

## 2020-09-15 DIAGNOSIS — N2581 Secondary hyperparathyroidism of renal origin: Secondary | ICD-10-CM | POA: Diagnosis not present

## 2020-09-15 DIAGNOSIS — N186 End stage renal disease: Secondary | ICD-10-CM | POA: Diagnosis not present

## 2020-09-17 DIAGNOSIS — D509 Iron deficiency anemia, unspecified: Secondary | ICD-10-CM | POA: Diagnosis not present

## 2020-09-17 DIAGNOSIS — D631 Anemia in chronic kidney disease: Secondary | ICD-10-CM | POA: Diagnosis not present

## 2020-09-17 DIAGNOSIS — N2581 Secondary hyperparathyroidism of renal origin: Secondary | ICD-10-CM | POA: Diagnosis not present

## 2020-09-17 DIAGNOSIS — N186 End stage renal disease: Secondary | ICD-10-CM | POA: Diagnosis not present

## 2020-09-20 DIAGNOSIS — D509 Iron deficiency anemia, unspecified: Secondary | ICD-10-CM | POA: Diagnosis not present

## 2020-09-20 DIAGNOSIS — N2581 Secondary hyperparathyroidism of renal origin: Secondary | ICD-10-CM | POA: Diagnosis not present

## 2020-09-20 DIAGNOSIS — D631 Anemia in chronic kidney disease: Secondary | ICD-10-CM | POA: Diagnosis not present

## 2020-09-20 DIAGNOSIS — N186 End stage renal disease: Secondary | ICD-10-CM | POA: Diagnosis not present

## 2020-09-20 DIAGNOSIS — B9689 Other specified bacterial agents as the cause of diseases classified elsewhere: Secondary | ICD-10-CM | POA: Diagnosis not present

## 2020-09-22 DIAGNOSIS — D509 Iron deficiency anemia, unspecified: Secondary | ICD-10-CM | POA: Diagnosis not present

## 2020-09-22 DIAGNOSIS — N186 End stage renal disease: Secondary | ICD-10-CM | POA: Diagnosis not present

## 2020-09-22 DIAGNOSIS — N2581 Secondary hyperparathyroidism of renal origin: Secondary | ICD-10-CM | POA: Diagnosis not present

## 2020-09-22 DIAGNOSIS — D631 Anemia in chronic kidney disease: Secondary | ICD-10-CM | POA: Diagnosis not present

## 2020-09-24 DIAGNOSIS — D631 Anemia in chronic kidney disease: Secondary | ICD-10-CM | POA: Diagnosis not present

## 2020-09-24 DIAGNOSIS — D509 Iron deficiency anemia, unspecified: Secondary | ICD-10-CM | POA: Diagnosis not present

## 2020-09-24 DIAGNOSIS — N186 End stage renal disease: Secondary | ICD-10-CM | POA: Diagnosis not present

## 2020-09-24 DIAGNOSIS — N2581 Secondary hyperparathyroidism of renal origin: Secondary | ICD-10-CM | POA: Diagnosis not present

## 2020-09-27 DIAGNOSIS — N186 End stage renal disease: Secondary | ICD-10-CM | POA: Diagnosis not present

## 2020-09-27 DIAGNOSIS — D509 Iron deficiency anemia, unspecified: Secondary | ICD-10-CM | POA: Diagnosis not present

## 2020-09-27 DIAGNOSIS — D631 Anemia in chronic kidney disease: Secondary | ICD-10-CM | POA: Diagnosis not present

## 2020-09-27 DIAGNOSIS — N2581 Secondary hyperparathyroidism of renal origin: Secondary | ICD-10-CM | POA: Diagnosis not present

## 2020-09-29 DIAGNOSIS — D509 Iron deficiency anemia, unspecified: Secondary | ICD-10-CM | POA: Diagnosis not present

## 2020-09-29 DIAGNOSIS — D631 Anemia in chronic kidney disease: Secondary | ICD-10-CM | POA: Diagnosis not present

## 2020-09-29 DIAGNOSIS — N2581 Secondary hyperparathyroidism of renal origin: Secondary | ICD-10-CM | POA: Diagnosis not present

## 2020-09-29 DIAGNOSIS — N186 End stage renal disease: Secondary | ICD-10-CM | POA: Diagnosis not present

## 2020-09-30 DIAGNOSIS — N186 End stage renal disease: Secondary | ICD-10-CM | POA: Diagnosis not present

## 2020-09-30 DIAGNOSIS — Z992 Dependence on renal dialysis: Secondary | ICD-10-CM | POA: Diagnosis not present

## 2020-10-01 DIAGNOSIS — N186 End stage renal disease: Secondary | ICD-10-CM | POA: Diagnosis not present

## 2020-10-01 DIAGNOSIS — E785 Hyperlipidemia, unspecified: Secondary | ICD-10-CM | POA: Diagnosis not present

## 2020-10-01 DIAGNOSIS — N2581 Secondary hyperparathyroidism of renal origin: Secondary | ICD-10-CM | POA: Diagnosis not present

## 2020-10-01 DIAGNOSIS — D631 Anemia in chronic kidney disease: Secondary | ICD-10-CM | POA: Diagnosis not present

## 2020-10-04 DIAGNOSIS — N186 End stage renal disease: Secondary | ICD-10-CM | POA: Diagnosis not present

## 2020-10-04 DIAGNOSIS — N2581 Secondary hyperparathyroidism of renal origin: Secondary | ICD-10-CM | POA: Diagnosis not present

## 2020-10-04 DIAGNOSIS — E785 Hyperlipidemia, unspecified: Secondary | ICD-10-CM | POA: Diagnosis not present

## 2020-10-04 DIAGNOSIS — D631 Anemia in chronic kidney disease: Secondary | ICD-10-CM | POA: Diagnosis not present

## 2020-10-06 DIAGNOSIS — N2581 Secondary hyperparathyroidism of renal origin: Secondary | ICD-10-CM | POA: Diagnosis not present

## 2020-10-06 DIAGNOSIS — E785 Hyperlipidemia, unspecified: Secondary | ICD-10-CM | POA: Diagnosis not present

## 2020-10-06 DIAGNOSIS — N186 End stage renal disease: Secondary | ICD-10-CM | POA: Diagnosis not present

## 2020-10-06 DIAGNOSIS — D631 Anemia in chronic kidney disease: Secondary | ICD-10-CM | POA: Diagnosis not present

## 2020-10-06 DIAGNOSIS — M109 Gout, unspecified: Secondary | ICD-10-CM | POA: Diagnosis not present

## 2020-10-08 DIAGNOSIS — N2581 Secondary hyperparathyroidism of renal origin: Secondary | ICD-10-CM | POA: Diagnosis not present

## 2020-10-08 DIAGNOSIS — N186 End stage renal disease: Secondary | ICD-10-CM | POA: Diagnosis not present

## 2020-10-08 DIAGNOSIS — E785 Hyperlipidemia, unspecified: Secondary | ICD-10-CM | POA: Diagnosis not present

## 2020-10-08 DIAGNOSIS — D631 Anemia in chronic kidney disease: Secondary | ICD-10-CM | POA: Diagnosis not present

## 2020-10-11 DIAGNOSIS — N186 End stage renal disease: Secondary | ICD-10-CM | POA: Diagnosis not present

## 2020-10-11 DIAGNOSIS — E785 Hyperlipidemia, unspecified: Secondary | ICD-10-CM | POA: Diagnosis not present

## 2020-10-11 DIAGNOSIS — D631 Anemia in chronic kidney disease: Secondary | ICD-10-CM | POA: Diagnosis not present

## 2020-10-11 DIAGNOSIS — N2581 Secondary hyperparathyroidism of renal origin: Secondary | ICD-10-CM | POA: Diagnosis not present

## 2020-10-13 DIAGNOSIS — N2581 Secondary hyperparathyroidism of renal origin: Secondary | ICD-10-CM | POA: Diagnosis not present

## 2020-10-13 DIAGNOSIS — D631 Anemia in chronic kidney disease: Secondary | ICD-10-CM | POA: Diagnosis not present

## 2020-10-13 DIAGNOSIS — E785 Hyperlipidemia, unspecified: Secondary | ICD-10-CM | POA: Diagnosis not present

## 2020-10-13 DIAGNOSIS — N186 End stage renal disease: Secondary | ICD-10-CM | POA: Diagnosis not present

## 2020-10-15 DIAGNOSIS — N2581 Secondary hyperparathyroidism of renal origin: Secondary | ICD-10-CM | POA: Diagnosis not present

## 2020-10-15 DIAGNOSIS — E785 Hyperlipidemia, unspecified: Secondary | ICD-10-CM | POA: Diagnosis not present

## 2020-10-15 DIAGNOSIS — D631 Anemia in chronic kidney disease: Secondary | ICD-10-CM | POA: Diagnosis not present

## 2020-10-15 DIAGNOSIS — N186 End stage renal disease: Secondary | ICD-10-CM | POA: Diagnosis not present

## 2020-10-16 ENCOUNTER — Other Ambulatory Visit: Payer: Self-pay | Admitting: Cardiovascular Disease

## 2020-10-18 DIAGNOSIS — E785 Hyperlipidemia, unspecified: Secondary | ICD-10-CM | POA: Diagnosis not present

## 2020-10-18 DIAGNOSIS — D631 Anemia in chronic kidney disease: Secondary | ICD-10-CM | POA: Diagnosis not present

## 2020-10-18 DIAGNOSIS — N186 End stage renal disease: Secondary | ICD-10-CM | POA: Diagnosis not present

## 2020-10-18 DIAGNOSIS — N2581 Secondary hyperparathyroidism of renal origin: Secondary | ICD-10-CM | POA: Diagnosis not present

## 2020-10-20 DIAGNOSIS — N186 End stage renal disease: Secondary | ICD-10-CM | POA: Diagnosis not present

## 2020-10-20 DIAGNOSIS — N2581 Secondary hyperparathyroidism of renal origin: Secondary | ICD-10-CM | POA: Diagnosis not present

## 2020-10-20 DIAGNOSIS — D631 Anemia in chronic kidney disease: Secondary | ICD-10-CM | POA: Diagnosis not present

## 2020-10-20 DIAGNOSIS — E785 Hyperlipidemia, unspecified: Secondary | ICD-10-CM | POA: Diagnosis not present

## 2020-10-22 DIAGNOSIS — E785 Hyperlipidemia, unspecified: Secondary | ICD-10-CM | POA: Diagnosis not present

## 2020-10-22 DIAGNOSIS — D631 Anemia in chronic kidney disease: Secondary | ICD-10-CM | POA: Diagnosis not present

## 2020-10-22 DIAGNOSIS — N2581 Secondary hyperparathyroidism of renal origin: Secondary | ICD-10-CM | POA: Diagnosis not present

## 2020-10-22 DIAGNOSIS — N186 End stage renal disease: Secondary | ICD-10-CM | POA: Diagnosis not present

## 2020-10-25 DIAGNOSIS — D631 Anemia in chronic kidney disease: Secondary | ICD-10-CM | POA: Diagnosis not present

## 2020-10-25 DIAGNOSIS — E785 Hyperlipidemia, unspecified: Secondary | ICD-10-CM | POA: Diagnosis not present

## 2020-10-25 DIAGNOSIS — N2581 Secondary hyperparathyroidism of renal origin: Secondary | ICD-10-CM | POA: Diagnosis not present

## 2020-10-25 DIAGNOSIS — N186 End stage renal disease: Secondary | ICD-10-CM | POA: Diagnosis not present

## 2020-10-27 DIAGNOSIS — E785 Hyperlipidemia, unspecified: Secondary | ICD-10-CM | POA: Diagnosis not present

## 2020-10-27 DIAGNOSIS — D631 Anemia in chronic kidney disease: Secondary | ICD-10-CM | POA: Diagnosis not present

## 2020-10-27 DIAGNOSIS — N2581 Secondary hyperparathyroidism of renal origin: Secondary | ICD-10-CM | POA: Diagnosis not present

## 2020-10-27 DIAGNOSIS — N186 End stage renal disease: Secondary | ICD-10-CM | POA: Diagnosis not present

## 2020-10-28 DIAGNOSIS — H6121 Impacted cerumen, right ear: Secondary | ICD-10-CM | POA: Diagnosis not present

## 2020-10-28 DIAGNOSIS — H60311 Diffuse otitis externa, right ear: Secondary | ICD-10-CM | POA: Diagnosis not present

## 2020-10-29 DIAGNOSIS — N186 End stage renal disease: Secondary | ICD-10-CM | POA: Diagnosis not present

## 2020-10-29 DIAGNOSIS — D631 Anemia in chronic kidney disease: Secondary | ICD-10-CM | POA: Diagnosis not present

## 2020-10-29 DIAGNOSIS — E785 Hyperlipidemia, unspecified: Secondary | ICD-10-CM | POA: Diagnosis not present

## 2020-10-29 DIAGNOSIS — N2581 Secondary hyperparathyroidism of renal origin: Secondary | ICD-10-CM | POA: Diagnosis not present

## 2020-10-31 DIAGNOSIS — Z992 Dependence on renal dialysis: Secondary | ICD-10-CM | POA: Diagnosis not present

## 2020-10-31 DIAGNOSIS — N186 End stage renal disease: Secondary | ICD-10-CM | POA: Diagnosis not present

## 2020-11-01 DIAGNOSIS — N186 End stage renal disease: Secondary | ICD-10-CM | POA: Diagnosis not present

## 2020-11-01 DIAGNOSIS — D631 Anemia in chronic kidney disease: Secondary | ICD-10-CM | POA: Diagnosis not present

## 2020-11-01 DIAGNOSIS — D509 Iron deficiency anemia, unspecified: Secondary | ICD-10-CM | POA: Diagnosis not present

## 2020-11-01 DIAGNOSIS — N2581 Secondary hyperparathyroidism of renal origin: Secondary | ICD-10-CM | POA: Diagnosis not present

## 2020-11-01 DIAGNOSIS — Z23 Encounter for immunization: Secondary | ICD-10-CM | POA: Diagnosis not present

## 2020-11-03 DIAGNOSIS — D509 Iron deficiency anemia, unspecified: Secondary | ICD-10-CM | POA: Diagnosis not present

## 2020-11-03 DIAGNOSIS — Z23 Encounter for immunization: Secondary | ICD-10-CM | POA: Diagnosis not present

## 2020-11-03 DIAGNOSIS — N186 End stage renal disease: Secondary | ICD-10-CM | POA: Diagnosis not present

## 2020-11-03 DIAGNOSIS — D631 Anemia in chronic kidney disease: Secondary | ICD-10-CM | POA: Diagnosis not present

## 2020-11-03 DIAGNOSIS — M109 Gout, unspecified: Secondary | ICD-10-CM | POA: Diagnosis not present

## 2020-11-03 DIAGNOSIS — N2581 Secondary hyperparathyroidism of renal origin: Secondary | ICD-10-CM | POA: Diagnosis not present

## 2020-11-05 DIAGNOSIS — D509 Iron deficiency anemia, unspecified: Secondary | ICD-10-CM | POA: Diagnosis not present

## 2020-11-05 DIAGNOSIS — N186 End stage renal disease: Secondary | ICD-10-CM | POA: Diagnosis not present

## 2020-11-05 DIAGNOSIS — N2581 Secondary hyperparathyroidism of renal origin: Secondary | ICD-10-CM | POA: Diagnosis not present

## 2020-11-05 DIAGNOSIS — D631 Anemia in chronic kidney disease: Secondary | ICD-10-CM | POA: Diagnosis not present

## 2020-11-05 DIAGNOSIS — Z23 Encounter for immunization: Secondary | ICD-10-CM | POA: Diagnosis not present

## 2020-11-08 DIAGNOSIS — N186 End stage renal disease: Secondary | ICD-10-CM | POA: Diagnosis not present

## 2020-11-08 DIAGNOSIS — D509 Iron deficiency anemia, unspecified: Secondary | ICD-10-CM | POA: Diagnosis not present

## 2020-11-08 DIAGNOSIS — N2581 Secondary hyperparathyroidism of renal origin: Secondary | ICD-10-CM | POA: Diagnosis not present

## 2020-11-08 DIAGNOSIS — D631 Anemia in chronic kidney disease: Secondary | ICD-10-CM | POA: Diagnosis not present

## 2020-11-08 DIAGNOSIS — Z23 Encounter for immunization: Secondary | ICD-10-CM | POA: Diagnosis not present

## 2020-11-09 ENCOUNTER — Encounter: Payer: Self-pay | Admitting: Cardiovascular Disease

## 2020-11-09 ENCOUNTER — Other Ambulatory Visit: Payer: Self-pay

## 2020-11-09 ENCOUNTER — Ambulatory Visit (INDEPENDENT_AMBULATORY_CARE_PROVIDER_SITE_OTHER): Payer: Medicare Other | Admitting: Cardiovascular Disease

## 2020-11-09 VITALS — BP 138/90 | HR 71 | Ht 63.0 in | Wt 170.0 lb

## 2020-11-09 DIAGNOSIS — E782 Mixed hyperlipidemia: Secondary | ICD-10-CM | POA: Diagnosis not present

## 2020-11-09 DIAGNOSIS — I10 Essential (primary) hypertension: Secondary | ICD-10-CM

## 2020-11-09 DIAGNOSIS — I214 Non-ST elevation (NSTEMI) myocardial infarction: Secondary | ICD-10-CM

## 2020-11-09 MED ORDER — NITROGLYCERIN 0.4 MG SL SUBL: 0.4000 mg | SUBLINGUAL_TABLET | SUBLINGUAL | 2 refills | Status: AC | PRN

## 2020-11-09 NOTE — Assessment & Plan Note (Signed)
History of hyperlipidemia on statin therapy followed by her PCP.  She has not had a lipid profile in our system for over 2 years.  We will recheck a fasting lipid and liver profile at her next blood draw in dialysis.

## 2020-11-09 NOTE — Assessment & Plan Note (Signed)
History of non-STEMI 08/26/2017.  She had catheterization performed by Dr. Angelena Form via the right femoral approach revealing a subtotally occluded first marginal branch which was stented using a Synergy drug-eluting stent (2.25 mm x 20 mm).  She had a 80% fairly focal mid dominant RCA stenosis which I intervened on in a staged fashion as an outpatient via the right femoral approach 12/31/2017.  She had no other significant CAD.  She is on a baby aspirin but no other antiplatelet agents.  She denies chest pain or shortness of breath.

## 2020-11-09 NOTE — Patient Instructions (Signed)
  Lab Work:  Your physician recommends that you HAVE LAB WORK AT NEXT DIALYSIS APPOINTMENT   If you have labs (blood work) drawn today and your tests are completely normal, you will receive your results only by: [facility] (if you have MyChart) OR A paper copy in the mail If you have any lab test that is abnormal or we need to change your treatment, we will call you to review the results.   Follow-Up: At [facility], you and your health needs are our priority.  As part of our continuing mission to provide you with exceptional heart care, we have created designated Provider Care Teams.  These Care Teams include your primary Cardiologist (physician) and Advanced Practice Providers (APPs -  Physician Assistants and Nurse Practitioners) who all work together to provide you with the care you need, when you need it.  We recommend signing up for the patient portal called "MyChart".  Sign up information is provided on this After Visit Summary.  MyChart is used to connect with patients for Virtual Visits (Telemedicine).  Patients are able to view lab/test results, encounter notes, upcoming appointments, etc.  Non-urgent messages can be sent to your provider as well.   To learn more about what you can do with MyChart, go to [email or web address].    Your next appointment:   6 month(s)  The format for your next appointment:   In Person  Provider:   You will see one of the following Advanced Practice Providers on your designated Care Team:    Coletta Memos, FNP  Then, Quay Burow, MD will plan to see you again in 12 month(s).

## 2020-11-09 NOTE — Assessment & Plan Note (Signed)
History of essential hypertension with blood pressure measured today at 138/90.  She is on carvedilol which she takes according to her blood pressure and dialysis schedule.  She was on amlodipine and hydralazine in the past which was discontinued because of hypotension during dialysis.  She keeps a close check on her blood pressures both pre and postdialysis in the lying in an sitting position as well as at home.

## 2020-11-09 NOTE — Progress Notes (Signed)
0/04/270 [name]   [birth year]  [id number]  Primary Physician Patient, No Pcp Per (Inactive) Primary Cardiologist: Lorretta Harp MD Garret Reddish, [place], [place]  HPI:  Lindsey York is a 55 y.o.   moderately overweight divorced African-American female mother of 2 children who I last saw in the office 12/05/2019.  She has a history of systemic lupus erythematosus, CKD 4 with serum creatinine is in the 5-6 range predialysis previously on the transplant list at [facility].  She also has history of treated hypertension hyperlipidemia.  She currently does not work.  She has never smoked.  She had a non-STEMI 08/26/2017 and catheterization performed by Dr. Angelena Form via the right femoral approach revealing a subtotally occluded first marginal branch which was stented with a synergy drug-eluting stent (2.25 mm x 20 mill meters).  She did have an 80% fairly focal mid dominant RCA stenosis which was not intervened on because of her renal insufficiency.  She is otherwise asymptomatic but wishes to continue on the renal transplant list which cannot occur until her RCA is intervened on.   I performed RCA PCI and drug-eluting stenting electively as an outpatient 12/31/2017 with a synergy drug-eluting stent via the right femoral approach.  She has done well since and remains on dual antiplatelet therapy.  I did use only 25 cc of contrast and this did not affect her renal function.   She did start hemodialysis in March of 2021 and is doing well with that.  She had some bleeding from her fistula site and stopped her aspirin.  We did stop her Brilinta last year because of bleeding from her dialysis site.  Since I saw her a year ago she continues to do well.  We have been adjusting her antihypertensive medications because of hypotension related to dialysis.  She denies chest pain or shortness of breath.  She is on the renal transplant list at [facility].  Current Meds  Medication Sig   acetaminophen (TYLENOL) 325 MG tablet Take 650 mg by mouth every 6 (six) hours as needed for moderate pain.   acyclovir ointment (ZOVIRAX) 5 % Apply twice a day to affected area as needed   aspirin EC 81 MG tablet Take 1 tablet (81 mg total) by mouth daily. Swallow whole.   B COMPLEX-C-FOLIC ACID ER PO Take by mouth.   bevacizumab (AVASTIN) 400 MG/16ML SOLN Inject 1.25 mg as directed every 30 (thirty) days. Pt takes as needed.   calcium carbonate (OS-CAL) 1250 (500 Ca) MG chewable tablet Chew by mouth.   carvedilol (COREG) 3.125 MG tablet Take 1 tablet (3.125 mg total) by mouth 2 (two) times daily with a meal.   conjugated estrogens (PREMARIN) vaginal cream    ezetimibe (ZETIA) 10 MG tablet TAKE 1 TABLET(10 MG) BY MOUTH DAILY   predniSONE (DELTASONE) 1 MG tablet Take 4 mg by mouth daily with breakfast. Pt takes 4mg  the every day   sevelamer carbonate (RENVELA) 800 MG tablet Take 800 mg by mouth in the morning, at noon, in the evening, and at bedtime.   simvastatin (ZOCOR) 20 MG tablet TAKE 1 TABLET(20 MG) BY MOUTH AT BEDTIME   valACYclovir (VALTREX) 500 MG tablet Take by mouth. Pt takes as needed.     Allergies  Allergen Reactions   Hydroxychloroquine Hives and Rash   Codeine     Social History   Socioeconomic History   Marital status: Divorced    Spouse name: Not  on file   Number of children: Not on file   Years of education: Not on file   Highest education level: Not on file  Occupational History   Not on file  Tobacco Use   Smoking status: Never   Smokeless tobacco: Never  Vaping Use   Vaping Use: Never used  Substance and Sexual Activity   Alcohol use: Never   Drug use: Never   Sexual activity: Yes  Other Topics Concern   Not on file  Social History Narrative   Not on file   Social Determinants of Health   Financial Resource Strain: Not on file  Food Insecurity: Not on file  Transportation Needs: Not on file  Physical  Activity: Not on file  Stress: Not on file  Social Connections: Not on file  Intimate Partner Violence: Not on file     Review of Systems: General: negative for chills, fever, night sweats or weight changes.  Cardiovascular: negative for chest pain, dyspnea on exertion, edema, orthopnea, palpitations, paroxysmal nocturnal dyspnea or shortness of breath Dermatological: negative for rash Respiratory: negative for cough or wheezing Urologic: negative for hematuria Abdominal: negative for nausea, vomiting, diarrhea, bright red blood per rectum, melena, or hematemesis Neurologic: negative for visual changes, syncope, or dizziness All other systems reviewed and are otherwise negative except as noted above.    Blood pressure 138/90, pulse 71, height 5\' 3"  (1.6 m), weight 170 lb (77.1 kg).  General appearance: alert and no distress Neck: no adenopathy, no carotid bruit, no JVD, supple, symmetrical, trachea midline, and thyroid not enlarged, symmetric, no tenderness/mass/nodules Lungs: clear to auscultation bilaterally Heart: regular rate and rhythm, S1, S2 normal, no murmur, click, rub or gallop Extremities: extremities normal, atraumatic, no cyanosis or edema Pulses: 2+ and symmetric Skin: Skin color, texture, turgor normal. No rashes or lesions Neurologic: Grossly normal  EKG sinus rhythm at 71 with nonspecific ST and T wave changes.  I personally reviewed this EKG.  ASSESSMENT AND PLAN:   NSTEMI (non-ST elevated myocardial infarction) ([place]) History of non-STEMI 08/26/2017.  She had catheterization performed by Dr. Angelena Form via the right femoral approach revealing a subtotally occluded first marginal branch which was stented using a Synergy drug-eluting stent (2.25 mm x 20 mm).  She had a 80% fairly focal mid dominant RCA stenosis which I intervened on in a staged fashion as an outpatient via the right femoral approach 12/31/2017.  She had no other significant CAD.  She is on a baby aspirin  but no other antiplatelet agents.  She denies chest pain or shortness of breath.  Hypertension History of essential hypertension with blood pressure measured today at 138/90.  She is on carvedilol which she takes according to her blood pressure and dialysis schedule.  She was on amlodipine and hydralazine in the past which was discontinued because of hypotension during dialysis.  She keeps a close check on her blood pressures both pre and postdialysis in the lying in an sitting position as well as at home.  Hyperlipidemia History of hyperlipidemia on statin therapy followed by her PCP.  She has not had a lipid profile in our system for over 2 years.  We will recheck a fasting lipid and liver profile at her next blood draw in dialysis.     Lorretta Harp MD FACP,FACC,FAHA, [facility] 11/09/2020 8:12 AM

## 2020-11-10 DIAGNOSIS — D631 Anemia in chronic kidney disease: Secondary | ICD-10-CM | POA: Diagnosis not present

## 2020-11-10 DIAGNOSIS — N186 End stage renal disease: Secondary | ICD-10-CM | POA: Diagnosis not present

## 2020-11-10 DIAGNOSIS — D509 Iron deficiency anemia, unspecified: Secondary | ICD-10-CM | POA: Diagnosis not present

## 2020-11-10 DIAGNOSIS — N2581 Secondary hyperparathyroidism of renal origin: Secondary | ICD-10-CM | POA: Diagnosis not present

## 2020-11-10 DIAGNOSIS — Z23 Encounter for immunization: Secondary | ICD-10-CM | POA: Diagnosis not present

## 2020-11-12 DIAGNOSIS — D631 Anemia in chronic kidney disease: Secondary | ICD-10-CM | POA: Diagnosis not present

## 2020-11-12 DIAGNOSIS — D509 Iron deficiency anemia, unspecified: Secondary | ICD-10-CM | POA: Diagnosis not present

## 2020-11-12 DIAGNOSIS — Z23 Encounter for immunization: Secondary | ICD-10-CM | POA: Diagnosis not present

## 2020-11-12 DIAGNOSIS — N2581 Secondary hyperparathyroidism of renal origin: Secondary | ICD-10-CM | POA: Diagnosis not present

## 2020-11-12 DIAGNOSIS — N186 End stage renal disease: Secondary | ICD-10-CM | POA: Diagnosis not present

## 2020-11-15 DIAGNOSIS — Z23 Encounter for immunization: Secondary | ICD-10-CM | POA: Diagnosis not present

## 2020-11-15 DIAGNOSIS — D509 Iron deficiency anemia, unspecified: Secondary | ICD-10-CM | POA: Diagnosis not present

## 2020-11-15 DIAGNOSIS — D631 Anemia in chronic kidney disease: Secondary | ICD-10-CM | POA: Diagnosis not present

## 2020-11-15 DIAGNOSIS — N186 End stage renal disease: Secondary | ICD-10-CM | POA: Diagnosis not present

## 2020-11-15 DIAGNOSIS — N2581 Secondary hyperparathyroidism of renal origin: Secondary | ICD-10-CM | POA: Diagnosis not present

## 2020-11-17 DIAGNOSIS — Z23 Encounter for immunization: Secondary | ICD-10-CM | POA: Diagnosis not present

## 2020-11-17 DIAGNOSIS — D509 Iron deficiency anemia, unspecified: Secondary | ICD-10-CM | POA: Diagnosis not present

## 2020-11-17 DIAGNOSIS — D631 Anemia in chronic kidney disease: Secondary | ICD-10-CM | POA: Diagnosis not present

## 2020-11-17 DIAGNOSIS — N186 End stage renal disease: Secondary | ICD-10-CM | POA: Diagnosis not present

## 2020-11-17 DIAGNOSIS — N2581 Secondary hyperparathyroidism of renal origin: Secondary | ICD-10-CM | POA: Diagnosis not present

## 2020-11-19 DIAGNOSIS — D509 Iron deficiency anemia, unspecified: Secondary | ICD-10-CM | POA: Diagnosis not present

## 2020-11-19 DIAGNOSIS — N186 End stage renal disease: Secondary | ICD-10-CM | POA: Diagnosis not present

## 2020-11-19 DIAGNOSIS — D631 Anemia in chronic kidney disease: Secondary | ICD-10-CM | POA: Diagnosis not present

## 2020-11-19 DIAGNOSIS — N2581 Secondary hyperparathyroidism of renal origin: Secondary | ICD-10-CM | POA: Diagnosis not present

## 2020-11-19 DIAGNOSIS — Z23 Encounter for immunization: Secondary | ICD-10-CM | POA: Diagnosis not present

## 2020-11-22 DIAGNOSIS — N2581 Secondary hyperparathyroidism of renal origin: Secondary | ICD-10-CM | POA: Diagnosis not present

## 2020-11-22 DIAGNOSIS — D509 Iron deficiency anemia, unspecified: Secondary | ICD-10-CM | POA: Diagnosis not present

## 2020-11-22 DIAGNOSIS — Z23 Encounter for immunization: Secondary | ICD-10-CM | POA: Diagnosis not present

## 2020-11-22 DIAGNOSIS — D631 Anemia in chronic kidney disease: Secondary | ICD-10-CM | POA: Diagnosis not present

## 2020-11-22 DIAGNOSIS — N186 End stage renal disease: Secondary | ICD-10-CM | POA: Diagnosis not present

## 2020-11-24 DIAGNOSIS — N2581 Secondary hyperparathyroidism of renal origin: Secondary | ICD-10-CM | POA: Diagnosis not present

## 2020-11-24 DIAGNOSIS — D509 Iron deficiency anemia, unspecified: Secondary | ICD-10-CM | POA: Diagnosis not present

## 2020-11-24 DIAGNOSIS — N186 End stage renal disease: Secondary | ICD-10-CM | POA: Diagnosis not present

## 2020-11-24 DIAGNOSIS — Z23 Encounter for immunization: Secondary | ICD-10-CM | POA: Diagnosis not present

## 2020-11-24 DIAGNOSIS — D631 Anemia in chronic kidney disease: Secondary | ICD-10-CM | POA: Diagnosis not present

## 2020-11-26 DIAGNOSIS — Z23 Encounter for immunization: Secondary | ICD-10-CM | POA: Diagnosis not present

## 2020-11-26 DIAGNOSIS — D631 Anemia in chronic kidney disease: Secondary | ICD-10-CM | POA: Diagnosis not present

## 2020-11-26 DIAGNOSIS — D509 Iron deficiency anemia, unspecified: Secondary | ICD-10-CM | POA: Diagnosis not present

## 2020-11-26 DIAGNOSIS — N186 End stage renal disease: Secondary | ICD-10-CM | POA: Diagnosis not present

## 2020-11-26 DIAGNOSIS — N2581 Secondary hyperparathyroidism of renal origin: Secondary | ICD-10-CM | POA: Diagnosis not present

## 2020-11-29 DIAGNOSIS — N2581 Secondary hyperparathyroidism of renal origin: Secondary | ICD-10-CM | POA: Diagnosis not present

## 2020-11-29 DIAGNOSIS — H35713 Central serous chorioretinopathy, bilateral: Secondary | ICD-10-CM | POA: Diagnosis not present

## 2020-11-29 DIAGNOSIS — Z23 Encounter for immunization: Secondary | ICD-10-CM | POA: Diagnosis not present

## 2020-11-29 DIAGNOSIS — H3554 Dystrophies primarily involving the retinal pigment epithelium: Secondary | ICD-10-CM | POA: Diagnosis not present

## 2020-11-29 DIAGNOSIS — N186 End stage renal disease: Secondary | ICD-10-CM | POA: Diagnosis not present

## 2020-11-29 DIAGNOSIS — H353221 Exudative age-related macular degeneration, left eye, with active choroidal neovascularization: Secondary | ICD-10-CM | POA: Diagnosis not present

## 2020-11-29 DIAGNOSIS — H2513 Age-related nuclear cataract, bilateral: Secondary | ICD-10-CM | POA: Diagnosis not present

## 2020-11-29 DIAGNOSIS — D509 Iron deficiency anemia, unspecified: Secondary | ICD-10-CM | POA: Diagnosis not present

## 2020-11-29 DIAGNOSIS — D631 Anemia in chronic kidney disease: Secondary | ICD-10-CM | POA: Diagnosis not present

## 2020-12-01 DIAGNOSIS — Z23 Encounter for immunization: Secondary | ICD-10-CM | POA: Diagnosis not present

## 2020-12-01 DIAGNOSIS — Z992 Dependence on renal dialysis: Secondary | ICD-10-CM | POA: Diagnosis not present

## 2020-12-01 DIAGNOSIS — D631 Anemia in chronic kidney disease: Secondary | ICD-10-CM | POA: Diagnosis not present

## 2020-12-01 DIAGNOSIS — N186 End stage renal disease: Secondary | ICD-10-CM | POA: Diagnosis not present

## 2020-12-01 DIAGNOSIS — N2581 Secondary hyperparathyroidism of renal origin: Secondary | ICD-10-CM | POA: Diagnosis not present

## 2020-12-01 DIAGNOSIS — D509 Iron deficiency anemia, unspecified: Secondary | ICD-10-CM | POA: Diagnosis not present

## 2020-12-02 DIAGNOSIS — T8249XA Other complication of vascular dialysis catheter, initial encounter: Secondary | ICD-10-CM | POA: Diagnosis not present

## 2020-12-02 DIAGNOSIS — N186 End stage renal disease: Secondary | ICD-10-CM | POA: Diagnosis not present

## 2020-12-02 DIAGNOSIS — Z992 Dependence on renal dialysis: Secondary | ICD-10-CM | POA: Diagnosis not present

## 2020-12-03 DIAGNOSIS — D631 Anemia in chronic kidney disease: Secondary | ICD-10-CM | POA: Diagnosis not present

## 2020-12-03 DIAGNOSIS — N2581 Secondary hyperparathyroidism of renal origin: Secondary | ICD-10-CM | POA: Diagnosis not present

## 2020-12-03 DIAGNOSIS — N186 End stage renal disease: Secondary | ICD-10-CM | POA: Diagnosis not present

## 2020-12-03 DIAGNOSIS — D509 Iron deficiency anemia, unspecified: Secondary | ICD-10-CM | POA: Diagnosis not present

## 2020-12-06 DIAGNOSIS — D631 Anemia in chronic kidney disease: Secondary | ICD-10-CM | POA: Diagnosis not present

## 2020-12-06 DIAGNOSIS — N186 End stage renal disease: Secondary | ICD-10-CM | POA: Diagnosis not present

## 2020-12-06 DIAGNOSIS — D509 Iron deficiency anemia, unspecified: Secondary | ICD-10-CM | POA: Diagnosis not present

## 2020-12-06 DIAGNOSIS — N2581 Secondary hyperparathyroidism of renal origin: Secondary | ICD-10-CM | POA: Diagnosis not present

## 2020-12-08 DIAGNOSIS — D509 Iron deficiency anemia, unspecified: Secondary | ICD-10-CM | POA: Diagnosis not present

## 2020-12-08 DIAGNOSIS — M109 Gout, unspecified: Secondary | ICD-10-CM | POA: Diagnosis not present

## 2020-12-08 DIAGNOSIS — N2581 Secondary hyperparathyroidism of renal origin: Secondary | ICD-10-CM | POA: Diagnosis not present

## 2020-12-08 DIAGNOSIS — D631 Anemia in chronic kidney disease: Secondary | ICD-10-CM | POA: Diagnosis not present

## 2020-12-08 DIAGNOSIS — N186 End stage renal disease: Secondary | ICD-10-CM | POA: Diagnosis not present

## 2020-12-10 DIAGNOSIS — D631 Anemia in chronic kidney disease: Secondary | ICD-10-CM | POA: Diagnosis not present

## 2020-12-10 DIAGNOSIS — D509 Iron deficiency anemia, unspecified: Secondary | ICD-10-CM | POA: Diagnosis not present

## 2020-12-10 DIAGNOSIS — N186 End stage renal disease: Secondary | ICD-10-CM | POA: Diagnosis not present

## 2020-12-10 DIAGNOSIS — N2581 Secondary hyperparathyroidism of renal origin: Secondary | ICD-10-CM | POA: Diagnosis not present

## 2020-12-11 ENCOUNTER — Other Ambulatory Visit: Payer: Self-pay | Admitting: Cardiovascular Disease

## 2020-12-13 DIAGNOSIS — N2581 Secondary hyperparathyroidism of renal origin: Secondary | ICD-10-CM | POA: Diagnosis not present

## 2020-12-13 DIAGNOSIS — D631 Anemia in chronic kidney disease: Secondary | ICD-10-CM | POA: Diagnosis not present

## 2020-12-13 DIAGNOSIS — N186 End stage renal disease: Secondary | ICD-10-CM | POA: Diagnosis not present

## 2020-12-13 DIAGNOSIS — D509 Iron deficiency anemia, unspecified: Secondary | ICD-10-CM | POA: Diagnosis not present

## 2020-12-15 DIAGNOSIS — D631 Anemia in chronic kidney disease: Secondary | ICD-10-CM | POA: Diagnosis not present

## 2020-12-15 DIAGNOSIS — N186 End stage renal disease: Secondary | ICD-10-CM | POA: Diagnosis not present

## 2020-12-15 DIAGNOSIS — N2581 Secondary hyperparathyroidism of renal origin: Secondary | ICD-10-CM | POA: Diagnosis not present

## 2020-12-15 DIAGNOSIS — D509 Iron deficiency anemia, unspecified: Secondary | ICD-10-CM | POA: Diagnosis not present

## 2020-12-17 DIAGNOSIS — N186 End stage renal disease: Secondary | ICD-10-CM | POA: Diagnosis not present

## 2020-12-17 DIAGNOSIS — D631 Anemia in chronic kidney disease: Secondary | ICD-10-CM | POA: Diagnosis not present

## 2020-12-17 DIAGNOSIS — N2581 Secondary hyperparathyroidism of renal origin: Secondary | ICD-10-CM | POA: Diagnosis not present

## 2020-12-17 DIAGNOSIS — D509 Iron deficiency anemia, unspecified: Secondary | ICD-10-CM | POA: Diagnosis not present

## 2020-12-20 DIAGNOSIS — D631 Anemia in chronic kidney disease: Secondary | ICD-10-CM | POA: Diagnosis not present

## 2020-12-20 DIAGNOSIS — N186 End stage renal disease: Secondary | ICD-10-CM | POA: Diagnosis not present

## 2020-12-20 DIAGNOSIS — D509 Iron deficiency anemia, unspecified: Secondary | ICD-10-CM | POA: Diagnosis not present

## 2020-12-20 DIAGNOSIS — N2581 Secondary hyperparathyroidism of renal origin: Secondary | ICD-10-CM | POA: Diagnosis not present

## 2020-12-22 DIAGNOSIS — D631 Anemia in chronic kidney disease: Secondary | ICD-10-CM | POA: Diagnosis not present

## 2020-12-22 DIAGNOSIS — D509 Iron deficiency anemia, unspecified: Secondary | ICD-10-CM | POA: Diagnosis not present

## 2020-12-22 DIAGNOSIS — N2581 Secondary hyperparathyroidism of renal origin: Secondary | ICD-10-CM | POA: Diagnosis not present

## 2020-12-22 DIAGNOSIS — N186 End stage renal disease: Secondary | ICD-10-CM | POA: Diagnosis not present

## 2020-12-24 DIAGNOSIS — N186 End stage renal disease: Secondary | ICD-10-CM | POA: Diagnosis not present

## 2020-12-24 DIAGNOSIS — D509 Iron deficiency anemia, unspecified: Secondary | ICD-10-CM | POA: Diagnosis not present

## 2020-12-24 DIAGNOSIS — N2581 Secondary hyperparathyroidism of renal origin: Secondary | ICD-10-CM | POA: Diagnosis not present

## 2020-12-24 DIAGNOSIS — D631 Anemia in chronic kidney disease: Secondary | ICD-10-CM | POA: Diagnosis not present

## 2020-12-27 DIAGNOSIS — N186 End stage renal disease: Secondary | ICD-10-CM | POA: Diagnosis not present

## 2020-12-27 DIAGNOSIS — D631 Anemia in chronic kidney disease: Secondary | ICD-10-CM | POA: Diagnosis not present

## 2020-12-27 DIAGNOSIS — N2581 Secondary hyperparathyroidism of renal origin: Secondary | ICD-10-CM | POA: Diagnosis not present

## 2020-12-27 DIAGNOSIS — D509 Iron deficiency anemia, unspecified: Secondary | ICD-10-CM | POA: Diagnosis not present

## 2020-12-29 DIAGNOSIS — N2581 Secondary hyperparathyroidism of renal origin: Secondary | ICD-10-CM | POA: Diagnosis not present

## 2020-12-29 DIAGNOSIS — D509 Iron deficiency anemia, unspecified: Secondary | ICD-10-CM | POA: Diagnosis not present

## 2020-12-29 DIAGNOSIS — N186 End stage renal disease: Secondary | ICD-10-CM | POA: Diagnosis not present

## 2020-12-29 DIAGNOSIS — D631 Anemia in chronic kidney disease: Secondary | ICD-10-CM | POA: Diagnosis not present

## 2020-12-31 DIAGNOSIS — Z992 Dependence on renal dialysis: Secondary | ICD-10-CM | POA: Diagnosis not present

## 2020-12-31 DIAGNOSIS — N186 End stage renal disease: Secondary | ICD-10-CM | POA: Diagnosis not present

## 2020-12-31 DIAGNOSIS — D631 Anemia in chronic kidney disease: Secondary | ICD-10-CM | POA: Diagnosis not present

## 2020-12-31 DIAGNOSIS — D509 Iron deficiency anemia, unspecified: Secondary | ICD-10-CM | POA: Diagnosis not present

## 2020-12-31 DIAGNOSIS — N2581 Secondary hyperparathyroidism of renal origin: Secondary | ICD-10-CM | POA: Diagnosis not present

## 2021-01-03 DIAGNOSIS — N186 End stage renal disease: Secondary | ICD-10-CM | POA: Diagnosis not present

## 2021-01-03 DIAGNOSIS — D509 Iron deficiency anemia, unspecified: Secondary | ICD-10-CM | POA: Diagnosis not present

## 2021-01-03 DIAGNOSIS — D631 Anemia in chronic kidney disease: Secondary | ICD-10-CM | POA: Diagnosis not present

## 2021-01-03 DIAGNOSIS — Z23 Encounter for immunization: Secondary | ICD-10-CM | POA: Diagnosis not present

## 2021-01-03 DIAGNOSIS — E785 Hyperlipidemia, unspecified: Secondary | ICD-10-CM | POA: Diagnosis not present

## 2021-01-03 DIAGNOSIS — N2581 Secondary hyperparathyroidism of renal origin: Secondary | ICD-10-CM | POA: Diagnosis not present

## 2021-01-05 DIAGNOSIS — E785 Hyperlipidemia, unspecified: Secondary | ICD-10-CM | POA: Diagnosis not present

## 2021-01-05 DIAGNOSIS — D509 Iron deficiency anemia, unspecified: Secondary | ICD-10-CM | POA: Diagnosis not present

## 2021-01-05 DIAGNOSIS — N186 End stage renal disease: Secondary | ICD-10-CM | POA: Diagnosis not present

## 2021-01-05 DIAGNOSIS — M109 Gout, unspecified: Secondary | ICD-10-CM | POA: Diagnosis not present

## 2021-01-05 DIAGNOSIS — D631 Anemia in chronic kidney disease: Secondary | ICD-10-CM | POA: Diagnosis not present

## 2021-01-05 DIAGNOSIS — Z23 Encounter for immunization: Secondary | ICD-10-CM | POA: Diagnosis not present

## 2021-01-05 DIAGNOSIS — N2581 Secondary hyperparathyroidism of renal origin: Secondary | ICD-10-CM | POA: Diagnosis not present

## 2021-01-06 DIAGNOSIS — L218 Other seborrheic dermatitis: Secondary | ICD-10-CM | POA: Diagnosis not present

## 2021-01-07 DIAGNOSIS — D509 Iron deficiency anemia, unspecified: Secondary | ICD-10-CM | POA: Diagnosis not present

## 2021-01-07 DIAGNOSIS — D631 Anemia in chronic kidney disease: Secondary | ICD-10-CM | POA: Diagnosis not present

## 2021-01-07 DIAGNOSIS — N2581 Secondary hyperparathyroidism of renal origin: Secondary | ICD-10-CM | POA: Diagnosis not present

## 2021-01-07 DIAGNOSIS — N186 End stage renal disease: Secondary | ICD-10-CM | POA: Diagnosis not present

## 2021-01-07 DIAGNOSIS — E785 Hyperlipidemia, unspecified: Secondary | ICD-10-CM | POA: Diagnosis not present

## 2021-01-07 DIAGNOSIS — Z23 Encounter for immunization: Secondary | ICD-10-CM | POA: Diagnosis not present

## 2021-01-10 DIAGNOSIS — N2581 Secondary hyperparathyroidism of renal origin: Secondary | ICD-10-CM | POA: Diagnosis not present

## 2021-01-10 DIAGNOSIS — D509 Iron deficiency anemia, unspecified: Secondary | ICD-10-CM | POA: Diagnosis not present

## 2021-01-10 DIAGNOSIS — N186 End stage renal disease: Secondary | ICD-10-CM | POA: Diagnosis not present

## 2021-01-10 DIAGNOSIS — Z23 Encounter for immunization: Secondary | ICD-10-CM | POA: Diagnosis not present

## 2021-01-10 DIAGNOSIS — D631 Anemia in chronic kidney disease: Secondary | ICD-10-CM | POA: Diagnosis not present

## 2021-01-10 DIAGNOSIS — E785 Hyperlipidemia, unspecified: Secondary | ICD-10-CM | POA: Diagnosis not present

## 2021-01-12 DIAGNOSIS — D631 Anemia in chronic kidney disease: Secondary | ICD-10-CM | POA: Diagnosis not present

## 2021-01-12 DIAGNOSIS — Z23 Encounter for immunization: Secondary | ICD-10-CM | POA: Diagnosis not present

## 2021-01-12 DIAGNOSIS — N186 End stage renal disease: Secondary | ICD-10-CM | POA: Diagnosis not present

## 2021-01-12 DIAGNOSIS — N2581 Secondary hyperparathyroidism of renal origin: Secondary | ICD-10-CM | POA: Diagnosis not present

## 2021-01-12 DIAGNOSIS — D509 Iron deficiency anemia, unspecified: Secondary | ICD-10-CM | POA: Diagnosis not present

## 2021-01-12 DIAGNOSIS — E785 Hyperlipidemia, unspecified: Secondary | ICD-10-CM | POA: Diagnosis not present

## 2021-01-13 DIAGNOSIS — Z1231 Encounter for screening mammogram for malignant neoplasm of breast: Secondary | ICD-10-CM | POA: Diagnosis not present

## 2021-01-14 DIAGNOSIS — N2581 Secondary hyperparathyroidism of renal origin: Secondary | ICD-10-CM | POA: Diagnosis not present

## 2021-01-14 DIAGNOSIS — D509 Iron deficiency anemia, unspecified: Secondary | ICD-10-CM | POA: Diagnosis not present

## 2021-01-14 DIAGNOSIS — Z23 Encounter for immunization: Secondary | ICD-10-CM | POA: Diagnosis not present

## 2021-01-14 DIAGNOSIS — E785 Hyperlipidemia, unspecified: Secondary | ICD-10-CM | POA: Diagnosis not present

## 2021-01-14 DIAGNOSIS — D631 Anemia in chronic kidney disease: Secondary | ICD-10-CM | POA: Diagnosis not present

## 2021-01-14 DIAGNOSIS — N186 End stage renal disease: Secondary | ICD-10-CM | POA: Diagnosis not present

## 2021-01-17 DIAGNOSIS — D631 Anemia in chronic kidney disease: Secondary | ICD-10-CM | POA: Diagnosis not present

## 2021-01-17 DIAGNOSIS — D509 Iron deficiency anemia, unspecified: Secondary | ICD-10-CM | POA: Diagnosis not present

## 2021-01-17 DIAGNOSIS — N2581 Secondary hyperparathyroidism of renal origin: Secondary | ICD-10-CM | POA: Diagnosis not present

## 2021-01-17 DIAGNOSIS — H2513 Age-related nuclear cataract, bilateral: Secondary | ICD-10-CM | POA: Diagnosis not present

## 2021-01-17 DIAGNOSIS — N186 End stage renal disease: Secondary | ICD-10-CM | POA: Diagnosis not present

## 2021-01-17 DIAGNOSIS — Z23 Encounter for immunization: Secondary | ICD-10-CM | POA: Diagnosis not present

## 2021-01-17 DIAGNOSIS — H353222 Exudative age-related macular degeneration, left eye, with inactive choroidal neovascularization: Secondary | ICD-10-CM | POA: Diagnosis not present

## 2021-01-17 DIAGNOSIS — E785 Hyperlipidemia, unspecified: Secondary | ICD-10-CM | POA: Diagnosis not present

## 2021-01-17 DIAGNOSIS — H3554 Dystrophies primarily involving the retinal pigment epithelium: Secondary | ICD-10-CM | POA: Diagnosis not present

## 2021-01-17 DIAGNOSIS — H35712 Central serous chorioretinopathy, left eye: Secondary | ICD-10-CM | POA: Diagnosis not present

## 2021-01-19 DIAGNOSIS — E785 Hyperlipidemia, unspecified: Secondary | ICD-10-CM | POA: Diagnosis not present

## 2021-01-19 DIAGNOSIS — N186 End stage renal disease: Secondary | ICD-10-CM | POA: Diagnosis not present

## 2021-01-19 DIAGNOSIS — N2581 Secondary hyperparathyroidism of renal origin: Secondary | ICD-10-CM | POA: Diagnosis not present

## 2021-01-19 DIAGNOSIS — Z23 Encounter for immunization: Secondary | ICD-10-CM | POA: Diagnosis not present

## 2021-01-19 DIAGNOSIS — D631 Anemia in chronic kidney disease: Secondary | ICD-10-CM | POA: Diagnosis not present

## 2021-01-19 DIAGNOSIS — D509 Iron deficiency anemia, unspecified: Secondary | ICD-10-CM | POA: Diagnosis not present

## 2021-01-21 DIAGNOSIS — N186 End stage renal disease: Secondary | ICD-10-CM | POA: Diagnosis not present

## 2021-01-21 DIAGNOSIS — D509 Iron deficiency anemia, unspecified: Secondary | ICD-10-CM | POA: Diagnosis not present

## 2021-01-21 DIAGNOSIS — Z23 Encounter for immunization: Secondary | ICD-10-CM | POA: Diagnosis not present

## 2021-01-21 DIAGNOSIS — D631 Anemia in chronic kidney disease: Secondary | ICD-10-CM | POA: Diagnosis not present

## 2021-01-21 DIAGNOSIS — E785 Hyperlipidemia, unspecified: Secondary | ICD-10-CM | POA: Diagnosis not present

## 2021-01-21 DIAGNOSIS — N2581 Secondary hyperparathyroidism of renal origin: Secondary | ICD-10-CM | POA: Diagnosis not present

## 2021-01-24 DIAGNOSIS — D509 Iron deficiency anemia, unspecified: Secondary | ICD-10-CM | POA: Diagnosis not present

## 2021-01-24 DIAGNOSIS — N2581 Secondary hyperparathyroidism of renal origin: Secondary | ICD-10-CM | POA: Diagnosis not present

## 2021-01-24 DIAGNOSIS — N186 End stage renal disease: Secondary | ICD-10-CM | POA: Diagnosis not present

## 2021-01-24 DIAGNOSIS — D631 Anemia in chronic kidney disease: Secondary | ICD-10-CM | POA: Diagnosis not present

## 2021-01-24 DIAGNOSIS — Z23 Encounter for immunization: Secondary | ICD-10-CM | POA: Diagnosis not present

## 2021-01-24 DIAGNOSIS — E785 Hyperlipidemia, unspecified: Secondary | ICD-10-CM | POA: Diagnosis not present

## 2021-01-25 ENCOUNTER — Telehealth: Payer: Self-pay | Admitting: Cardiovascular Disease

## 2021-01-25 DIAGNOSIS — K219 Gastro-esophageal reflux disease without esophagitis: Secondary | ICD-10-CM | POA: Diagnosis not present

## 2021-01-25 DIAGNOSIS — Z955 Presence of coronary angioplasty implant and graft: Secondary | ICD-10-CM | POA: Diagnosis not present

## 2021-01-25 DIAGNOSIS — Z992 Dependence on renal dialysis: Secondary | ICD-10-CM | POA: Diagnosis not present

## 2021-01-25 DIAGNOSIS — M3214 Glomerular disease in systemic lupus erythematosus: Secondary | ICD-10-CM | POA: Diagnosis not present

## 2021-01-25 DIAGNOSIS — H35712 Central serous chorioretinopathy, left eye: Secondary | ICD-10-CM | POA: Diagnosis not present

## 2021-01-25 DIAGNOSIS — N186 End stage renal disease: Secondary | ICD-10-CM | POA: Diagnosis not present

## 2021-01-25 DIAGNOSIS — Z95828 Presence of other vascular implants and grafts: Secondary | ICD-10-CM | POA: Diagnosis not present

## 2021-01-25 DIAGNOSIS — I2510 Atherosclerotic heart disease of native coronary artery without angina pectoris: Secondary | ICD-10-CM | POA: Diagnosis not present

## 2021-01-25 NOTE — Telephone Encounter (Signed)
New Message:   Patient said she have been having some aching in her left shoulder. She said years ago when she had aching in her right shoulder, she ended up having a stent put in. I made her n appointment for Tuesday(11-1-222). Please call to evaluate.

## 2021-01-25 NOTE — Telephone Encounter (Signed)
Spoke to patient she stated for the past couple of days her left shoulder has been achy off and on.Pain does not radiate down left arm.No chest pain.No sob.Appointment offered for tomorrow 10/26,but she is unable to come.Advised to keep appointment already scheduled with Roby Lofts PA 11/1 at 8:25 am.Advised if pain worsens go to ED.

## 2021-01-26 ENCOUNTER — Ambulatory Visit (HOSPITAL_BASED_OUTPATIENT_CLINIC_OR_DEPARTMENT_OTHER): Payer: Medicare Other | Admitting: Family

## 2021-01-26 DIAGNOSIS — D509 Iron deficiency anemia, unspecified: Secondary | ICD-10-CM | POA: Diagnosis not present

## 2021-01-26 DIAGNOSIS — Z23 Encounter for immunization: Secondary | ICD-10-CM | POA: Diagnosis not present

## 2021-01-26 DIAGNOSIS — N186 End stage renal disease: Secondary | ICD-10-CM | POA: Diagnosis not present

## 2021-01-26 DIAGNOSIS — E785 Hyperlipidemia, unspecified: Secondary | ICD-10-CM | POA: Diagnosis not present

## 2021-01-26 DIAGNOSIS — N2581 Secondary hyperparathyroidism of renal origin: Secondary | ICD-10-CM | POA: Diagnosis not present

## 2021-01-26 DIAGNOSIS — D631 Anemia in chronic kidney disease: Secondary | ICD-10-CM | POA: Diagnosis not present

## 2021-01-27 NOTE — Progress Notes (Deleted)
Cardiology Office Note   Date:  90/24/0973   ID:  [name], DOB [birth year], MRN [id number]  PCP:  Patient, No Pcp Per (Inactive)  Cardiologist:  Quay Burow, MD EP: None  No chief complaint on file.     History of Present Illness: Lindsey York is a 55 y.o. female with a PMH of CAD s/p PCI/DES to OM2 and RCA in 2019, HTN, HLD, SLE, and ESRD on HD awaiting transplant, who presents for evaluation of shoulder pain.  She was last evaluated by cardiology at an outpatient visit with Dr. Gwenlyn Found 11/2020 at which time she was doing well from a cardiac standpoint. She had some hypotension related to dialysis but otherwise was without chest pain or SOB.  Her last ischemic evaluation was a LHC in 2019 which showed 80% mid RCA stenosis managed with PCI/DES, otherwise patent previously placed OM2 stent earlier in 2019.  She continues on mono antiplatelet therapy with aspirin 81 mg daily.  Her last echocardiogram in 2019 showed EF 60-65%, no R WMA, G1 DD, moderate LVH, and no significant valvular abnormalities.  She contacted our office 01/25/21 to report intermittent left shoulder pain though no radiation of pain, chest pain, or SOB noted. She reported prior right shoulder pain when she needed a coronary artery stent in the past prompting this visit.   1. [place] in patient with coronary artery disease s/p PCI/DES to OM2 and RCA in 2019: - Continue aspirin, statin, and zetia  2. HTN: BP *** today - Continue carvedilol  3. HLD: LDL 54 01/05/21 - Continue simvastatin and zetia  4. 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: awaiting transplant  - Continue volume management per nephrology via HD - Continue prednisone per rheumatology  Past Medical History:  Diagnosis Date   Anemia    CKD (chronic kidney disease), stage V ([place])    "followed at [facility]" (08/28/2017)   Coronary artery disease    Diarrhea 09/2017   GERD (gastroesophageal reflux disease)    High cholesterol    History of  gout    "on daily RX"" (08/28/2017)   Hypertension    NSTEMI (non-ST elevated myocardial infarction) ([place]) 08/26/2017   Systemic lupus ([place])     Past Surgical History:  Procedure Laterality Date   BREAST SURGERY Left ~ 1978   "tumors taken out"   CORONARY ANGIOGRAPHY N/A 12/31/2017   Procedure: CORONARY ANGIOGRAPHY;  Surgeon: Lorretta Harp, MD;  Location: [place] [facility];  Service: Cardiovascular;  Laterality: N/A;   CORONARY STENT INTERVENTION N/A 08/28/2017   Procedure: CORONARY STENT INTERVENTION;  Surgeon: Burnell Blanks, MD;  Location: [place] [facility];  Service: Cardiovascular;  Laterality: N/A;   CORONARY STENT INTERVENTION  12/31/2017   CORONARY STENT INTERVENTION N/A 12/31/2017   Procedure: CORONARY STENT INTERVENTION;  Surgeon: Lorretta Harp, MD;  Location: [place] [facility];  Service: Cardiovascular;  Laterality: N/A;   HERNIA REPAIR     LAPAROSCOPIC CHOLECYSTECTOMY     LEFT HEART CATH AND CORONARY ANGIOGRAPHY N/A 08/28/2017   Procedure: LEFT HEART CATH AND CORONARY ANGIOGRAPHY;  Surgeon: Burnell Blanks, MD;  Location: [place] [facility];  Service: Cardiovascular;  Laterality: N/A;   MOUTH SURGERY     oral cavity biopsy   TUBAL LIGATION     UMBILICAL HERNIA REPAIR       Current Outpatient Medications  Medication Sig Dispense Refill   acetaminophen (TYLENOL) 325 MG tablet Take 650 mg by mouth every 6 (six)  hours as needed for moderate pain.     acyclovir ointment (ZOVIRAX) 5 % Apply twice a day to affected area as needed     aspirin EC 81 MG tablet Take 1 tablet (81 mg total) by mouth daily. Swallow whole. 90 tablet 3   B COMPLEX-C-FOLIC ACID ER PO Take by mouth.     bevacizumab (AVASTIN) 400 MG/16ML SOLN Inject 1.25 mg as directed every 30 (thirty) days. Pt takes as needed.     calcium carbonate (OS-CAL) 1250 (500 Ca) MG chewable tablet Chew by mouth.     carvedilol (COREG) 3.125 MG tablet Take 1 tablet (3.125 mg total) by mouth 2 (two) times  daily with a meal. 60 tablet 11   conjugated estrogens (PREMARIN) vaginal cream      ezetimibe (ZETIA) 10 MG tablet TAKE 1 TABLET(10 MG) BY MOUTH DAILY 90 tablet 3   nitroGLYCERIN (NITROSTAT) 0.4 MG SL tablet Place 1 tablet (0.4 mg total) under the tongue every 5 (five) minutes as needed for chest pain. 25 tablet 2   predniSONE (DELTASONE) 1 MG tablet Take 4 mg by mouth daily with breakfast. Pt takes 4mg  the every day     sevelamer carbonate (RENVELA) 800 MG tablet Take 800 mg by mouth in the morning, at noon, in the evening, and at bedtime.     simvastatin (ZOCOR) 20 MG tablet TAKE 1 TABLET(20 MG) BY MOUTH AT BEDTIME 90 tablet 3   valACYclovir (VALTREX) 500 MG tablet Take by mouth. Pt takes as needed.     No current facility-administered medications for this visit.    Allergies:   Hydroxychloroquine and Codeine    Social History:  The patient  reports that she has never smoked. She has never used smokeless tobacco. She reports that she does not drink alcohol and does not use drugs.   Family History:  The patient's ***family history is not on file.    ROS:  Please see the history of present illness.   Otherwise, review of systems are positive for {NONE DEFAULTED:18576}.   All other systems are reviewed and negative.    PHYSICAL EXAM: VS:  There were no vitals taken for this visit. , BMI There is no height or weight on file to calculate BMI. GEN: Well nourished, well developed, in no acute distress HEENT: normal Neck: no JVD, carotid bruits, or masses Cardiac: ***RRR; no murmurs, rubs, or gallops,no edema  Respiratory:  clear to auscultation bilaterally, normal work of breathing GI: soft, nontender, nondistended, + BS MS: no deformity or atrophy Skin: warm and dry, no rash Neuro:  Strength and sensation are intact Psych: euthymic mood, full affect   EKG:  EKG {ACTION; IS/IS RXV:[id number]} ordered today. The ekg ordered today demonstrates ***   Recent Labs: No results found for  requested labs within last 8760 hours.    Lipid Panel    Component Value Date/Time   CHOL 141 11/06/2018 0910   TRIG 72 11/06/2018 0910   HDL 61 11/06/2018 0910   CHOLHDL 2.3 11/06/2018 0910   CHOLHDL 3.8 08/30/2017 0227   VLDL 22 08/30/2017 0227   LDLCALC 66 11/06/2018 0910      Wt Readings from Last 3 Encounters:  11/09/20 170 lb (77.1 kg)  02/23/20 157 lb (71.2 kg)  01/29/20 154 lb (69.9 kg)      Other studies Reviewed: Additional studies/ records that were reviewed today include:   LHC 12/2017:  Mid RCA lesion is 80% stenosed. Previously placed Ost 2nd Mrg to 2nd  Mrg stent (unknown type) is widely patent. A drug-eluting stent was successfully placed. Post intervention, there is a 0% residual stenosis.  IMPRESSION: Ms. Riggenbach is a widely patent circumflex obtuse marginal branch stent recently placed by Dr. Angelena Form in the setting of "non-STEMI".  She had successful mid dominant RCA PCI and drug-eluting stenting using a synergy drug-eluting stent postdilated to 3.3 mm.  Total contrast administered to the patient was 25 cc.  Angiomax was discontinued at the end of the case.  The patient was already on dual antiplatelet therapy.  Sheath will be removed in several hours and pressure held.  Patient will be gently hydrated.  Dr. Marval Regal, the patient's nephrologist, is aware of the procedure.  She will be discharged home in the morning and follow-up with me in 1 to 2 weeks.  She left the lab in stable condition.  Diagnostic Dominance: Right Intervention    LHC 08/2017: Prox RCA lesion is 30% stenosed. Mid RCA lesion is 80% stenosed. Ost 1st Diag to 1st Diag lesion is 40% stenosed. Mid LAD lesion is 30% stenosed. Ost 2nd Mrg lesion is 99% stenosed. A drug-eluting stent was successfully placed using a STENT SYNERGY DES 2.25X20. Post intervention, there is a 0% residual stenosis.   1. NSTEMI secondary to severe stenosis in the second obtuse marginal branch 2. Successful  PTCA/DES x 1 second OM branch 3. Severe stenosis in the mid segment of the large dominant RCA. This is not critical but will need to be addressed with PCI later this week.  4. Mild non-obstructive disease in the LAD and Diagonal   Recommendations: Continue DAPT with ASA and Brilinta for one year. We used 80 cc contrast dye for her diagnostic cath and PCI. Will hydrate aggressively given her CKD and will plan staged PCI of the RCA later this week if renal function remains stable post PCI.     Echocardiogram 2019: - Left ventricle: The cavity size was normal. Wall thickness was    increased in a pattern of moderate LVH. Systolic function was    normal. The estimated ejection fraction was in the range of 60%    to 65%. Wall motion was normal; there were no regional wall    motion abnormalities. Doppler parameters are consistent with    abnormal left ventricular relaxation (grade 1 diastolic    dysfunction). The E/e&' ratio is between 8-15, suggesting    indeterminate LV filling pressure.  - Aortic valve: Trileaflet. There was no stenosis. There was    trivial regurgitation.  - Mitral valve: Mildly thickened leaflets . There was trivial    regurgitation.  - Left atrium: The atrium was normal in size.  - Tricuspid valve: There was trivial regurgitation.  - Pulmonary arteries: PA peak pressure: 21 mm Hg (S).  - Inferior vena cava: The vessel was normal in size. The    respirophasic diameter changes were in the normal range (>= 50%),    consistent with normal central venous pressure.   Impressions:   - LVEF 60-65%, moderate LVH, normal wall motion, grade 1 DD,    indeterminate LV filling pressure, trivial AI, trivial MR, normal    LA Size, trivial TR, RVSP 21 mmHg, normal IVC.     ASSESSMENT AND PLAN:  1.  ***   Current medicines are reviewed at length with the patient today.  The patient {ACTIONS; HAS/DOES NOT HAVE:19233} concerns regarding medicines.  The following changes have  been made:  {PLAN; NO CHANGE:13088:s}  Labs/ tests ordered today  include: *** No orders of the defined types were placed in this encounter.    Disposition:   FU with *** in {gen number 6-18:[id number]} {Days to years:10300}  Signed, Abigail Butts, PA-C  01/27/2021 6:48 PM

## 2021-01-28 DIAGNOSIS — D509 Iron deficiency anemia, unspecified: Secondary | ICD-10-CM | POA: Diagnosis not present

## 2021-01-28 DIAGNOSIS — D631 Anemia in chronic kidney disease: Secondary | ICD-10-CM | POA: Diagnosis not present

## 2021-01-28 DIAGNOSIS — N2581 Secondary hyperparathyroidism of renal origin: Secondary | ICD-10-CM | POA: Diagnosis not present

## 2021-01-28 DIAGNOSIS — N186 End stage renal disease: Secondary | ICD-10-CM | POA: Diagnosis not present

## 2021-01-28 DIAGNOSIS — E785 Hyperlipidemia, unspecified: Secondary | ICD-10-CM | POA: Diagnosis not present

## 2021-01-28 DIAGNOSIS — Z23 Encounter for immunization: Secondary | ICD-10-CM | POA: Diagnosis not present

## 2021-01-30 DIAGNOSIS — Z01818 Encounter for other preprocedural examination: Secondary | ICD-10-CM | POA: Diagnosis not present

## 2021-01-30 DIAGNOSIS — D696 Thrombocytopenia, unspecified: Secondary | ICD-10-CM | POA: Diagnosis not present

## 2021-01-30 DIAGNOSIS — E875 Hyperkalemia: Secondary | ICD-10-CM | POA: Diagnosis not present

## 2021-01-30 DIAGNOSIS — I82621 Acute embolism and thrombosis of deep veins of right upper extremity: Secondary | ICD-10-CM | POA: Diagnosis not present

## 2021-01-30 DIAGNOSIS — T8132XA Disruption of internal operation (surgical) wound, not elsewhere classified, initial encounter: Secondary | ICD-10-CM | POA: Diagnosis not present

## 2021-01-30 DIAGNOSIS — M3214 Glomerular disease in systemic lupus erythematosus: Secondary | ICD-10-CM | POA: Diagnosis not present

## 2021-01-30 DIAGNOSIS — N1330 Unspecified hydronephrosis: Secondary | ICD-10-CM | POA: Diagnosis not present

## 2021-01-30 DIAGNOSIS — I120 Hypertensive chronic kidney disease with stage 5 chronic kidney disease or end stage renal disease: Secondary | ICD-10-CM | POA: Diagnosis not present

## 2021-01-30 DIAGNOSIS — Z4822 Encounter for aftercare following kidney transplant: Secondary | ICD-10-CM | POA: Diagnosis not present

## 2021-01-30 DIAGNOSIS — E785 Hyperlipidemia, unspecified: Secondary | ICD-10-CM | POA: Diagnosis not present

## 2021-01-30 DIAGNOSIS — E872 Acidosis, unspecified: Secondary | ICD-10-CM | POA: Diagnosis not present

## 2021-01-30 DIAGNOSIS — N186 End stage renal disease: Secondary | ICD-10-CM | POA: Diagnosis not present

## 2021-01-30 DIAGNOSIS — N042 Nephrotic syndrome with diffuse membranous glomerulonephritis: Secondary | ICD-10-CM | POA: Diagnosis not present

## 2021-01-30 DIAGNOSIS — Z992 Dependence on renal dialysis: Secondary | ICD-10-CM | POA: Diagnosis not present

## 2021-01-30 DIAGNOSIS — K9172 Accidental puncture and laceration of a digestive system organ or structure during other procedure: Secondary | ICD-10-CM | POA: Diagnosis not present

## 2021-01-30 DIAGNOSIS — H3500 Unspecified background retinopathy: Secondary | ICD-10-CM | POA: Diagnosis not present

## 2021-01-30 DIAGNOSIS — Z7682 Awaiting organ transplant status: Secondary | ICD-10-CM | POA: Diagnosis not present

## 2021-01-30 DIAGNOSIS — D631 Anemia in chronic kidney disease: Secondary | ICD-10-CM | POA: Diagnosis not present

## 2021-01-30 DIAGNOSIS — D62 Acute posthemorrhagic anemia: Secondary | ICD-10-CM | POA: Diagnosis not present

## 2021-01-30 DIAGNOSIS — Z20822 Contact with and (suspected) exposure to covid-19: Secondary | ICD-10-CM | POA: Diagnosis not present

## 2021-01-31 DIAGNOSIS — I2109 ST elevation (STEMI) myocardial infarction involving other coronary artery of anterior wall: Secondary | ICD-10-CM | POA: Diagnosis not present

## 2021-01-31 DIAGNOSIS — N186 End stage renal disease: Secondary | ICD-10-CM | POA: Diagnosis not present

## 2021-01-31 DIAGNOSIS — I440 Atrioventricular block, first degree: Secondary | ICD-10-CM | POA: Diagnosis not present

## 2021-01-31 DIAGNOSIS — R0989 Other specified symptoms and signs involving the circulatory and respiratory systems: Secondary | ICD-10-CM | POA: Diagnosis not present

## 2021-01-31 DIAGNOSIS — E875 Hyperkalemia: Secondary | ICD-10-CM | POA: Diagnosis not present

## 2021-01-31 DIAGNOSIS — I2119 ST elevation (STEMI) myocardial infarction involving other coronary artery of inferior wall: Secondary | ICD-10-CM | POA: Diagnosis not present

## 2021-01-31 DIAGNOSIS — Z4822 Encounter for aftercare following kidney transplant: Secondary | ICD-10-CM | POA: Diagnosis not present

## 2021-01-31 DIAGNOSIS — R9431 Abnormal electrocardiogram [ECG] [EKG]: Secondary | ICD-10-CM | POA: Diagnosis not present

## 2021-01-31 DIAGNOSIS — Z992 Dependence on renal dialysis: Secondary | ICD-10-CM | POA: Diagnosis not present

## 2021-01-31 DIAGNOSIS — Z5181 Encounter for therapeutic drug level monitoring: Secondary | ICD-10-CM | POA: Diagnosis not present

## 2021-01-31 DIAGNOSIS — I517 Cardiomegaly: Secondary | ICD-10-CM | POA: Diagnosis not present

## 2021-01-31 DIAGNOSIS — I2510 Atherosclerotic heart disease of native coronary artery without angina pectoris: Secondary | ICD-10-CM | POA: Diagnosis not present

## 2021-01-31 DIAGNOSIS — D62 Acute posthemorrhagic anemia: Secondary | ICD-10-CM | POA: Diagnosis not present

## 2021-01-31 DIAGNOSIS — I252 Old myocardial infarction: Secondary | ICD-10-CM | POA: Diagnosis not present

## 2021-01-31 DIAGNOSIS — Z9861 Coronary angioplasty status: Secondary | ICD-10-CM | POA: Diagnosis not present

## 2021-01-31 DIAGNOSIS — N1330 Unspecified hydronephrosis: Secondary | ICD-10-CM | POA: Diagnosis not present

## 2021-01-31 DIAGNOSIS — I209 Angina pectoris, unspecified: Secondary | ICD-10-CM | POA: Diagnosis not present

## 2021-01-31 DIAGNOSIS — Z940 Kidney transplant status: Secondary | ICD-10-CM | POA: Diagnosis not present

## 2021-02-01 ENCOUNTER — Ambulatory Visit: Payer: Medicare Other | Admitting: Medical

## 2021-02-01 DIAGNOSIS — N1330 Unspecified hydronephrosis: Secondary | ICD-10-CM | POA: Diagnosis not present

## 2021-02-01 DIAGNOSIS — Z940 Kidney transplant status: Secondary | ICD-10-CM | POA: Diagnosis not present

## 2021-02-01 DIAGNOSIS — E782 Mixed hyperlipidemia: Secondary | ICD-10-CM

## 2021-02-01 DIAGNOSIS — I82621 Acute embolism and thrombosis of deep veins of right upper extremity: Secondary | ICD-10-CM | POA: Diagnosis not present

## 2021-02-01 DIAGNOSIS — M3214 Glomerular disease in systemic lupus erythematosus: Secondary | ICD-10-CM

## 2021-02-01 DIAGNOSIS — D649 Anemia, unspecified: Secondary | ICD-10-CM | POA: Diagnosis not present

## 2021-02-01 DIAGNOSIS — Z452 Encounter for adjustment and management of vascular access device: Secondary | ICD-10-CM | POA: Diagnosis not present

## 2021-02-01 DIAGNOSIS — Z992 Dependence on renal dialysis: Secondary | ICD-10-CM | POA: Diagnosis not present

## 2021-02-01 DIAGNOSIS — N186 End stage renal disease: Secondary | ICD-10-CM

## 2021-02-01 DIAGNOSIS — Z4822 Encounter for aftercare following kidney transplant: Secondary | ICD-10-CM | POA: Diagnosis not present

## 2021-02-01 DIAGNOSIS — I7090 Unspecified atherosclerosis: Secondary | ICD-10-CM | POA: Diagnosis not present

## 2021-02-01 DIAGNOSIS — D696 Thrombocytopenia, unspecified: Secondary | ICD-10-CM | POA: Diagnosis not present

## 2021-02-01 DIAGNOSIS — I2510 Atherosclerotic heart disease of native coronary artery without angina pectoris: Secondary | ICD-10-CM

## 2021-02-01 DIAGNOSIS — Z5181 Encounter for therapeutic drug level monitoring: Secondary | ICD-10-CM | POA: Diagnosis not present

## 2021-02-01 DIAGNOSIS — I120 Hypertensive chronic kidney disease with stage 5 chronic kidney disease or end stage renal disease: Secondary | ICD-10-CM | POA: Diagnosis not present

## 2021-02-01 DIAGNOSIS — I742 Embolism and thrombosis of arteries of the upper extremities: Secondary | ICD-10-CM | POA: Diagnosis not present

## 2021-02-01 DIAGNOSIS — I10 Essential (primary) hypertension: Secondary | ICD-10-CM

## 2021-02-01 DIAGNOSIS — E875 Hyperkalemia: Secondary | ICD-10-CM | POA: Diagnosis not present

## 2021-02-01 DIAGNOSIS — D62 Acute posthemorrhagic anemia: Secondary | ICD-10-CM | POA: Diagnosis not present

## 2021-02-02 DIAGNOSIS — D631 Anemia in chronic kidney disease: Secondary | ICD-10-CM | POA: Diagnosis not present

## 2021-02-02 DIAGNOSIS — I770 Arteriovenous fistula, acquired: Secondary | ICD-10-CM | POA: Diagnosis not present

## 2021-02-02 DIAGNOSIS — R9431 Abnormal electrocardiogram [ECG] [EKG]: Secondary | ICD-10-CM | POA: Diagnosis not present

## 2021-02-02 DIAGNOSIS — Z5181 Encounter for therapeutic drug level monitoring: Secondary | ICD-10-CM | POA: Diagnosis not present

## 2021-02-02 DIAGNOSIS — Z4822 Encounter for aftercare following kidney transplant: Secondary | ICD-10-CM | POA: Diagnosis not present

## 2021-02-02 DIAGNOSIS — Z992 Dependence on renal dialysis: Secondary | ICD-10-CM | POA: Diagnosis not present

## 2021-02-02 DIAGNOSIS — N186 End stage renal disease: Secondary | ICD-10-CM | POA: Diagnosis not present

## 2021-02-02 DIAGNOSIS — N1330 Unspecified hydronephrosis: Secondary | ICD-10-CM | POA: Diagnosis not present

## 2021-02-02 DIAGNOSIS — E875 Hyperkalemia: Secondary | ICD-10-CM | POA: Diagnosis not present

## 2021-02-02 DIAGNOSIS — I82621 Acute embolism and thrombosis of deep veins of right upper extremity: Secondary | ICD-10-CM | POA: Diagnosis not present

## 2021-02-02 DIAGNOSIS — Z940 Kidney transplant status: Secondary | ICD-10-CM | POA: Diagnosis not present

## 2021-02-02 DIAGNOSIS — I120 Hypertensive chronic kidney disease with stage 5 chronic kidney disease or end stage renal disease: Secondary | ICD-10-CM | POA: Diagnosis not present

## 2021-02-02 DIAGNOSIS — E878 Other disorders of electrolyte and fluid balance, not elsewhere classified: Secondary | ICD-10-CM | POA: Diagnosis not present

## 2021-02-02 DIAGNOSIS — M3214 Glomerular disease in systemic lupus erythematosus: Secondary | ICD-10-CM | POA: Diagnosis not present

## 2021-02-03 DIAGNOSIS — N1330 Unspecified hydronephrosis: Secondary | ICD-10-CM | POA: Diagnosis not present

## 2021-02-03 DIAGNOSIS — R9431 Abnormal electrocardiogram [ECG] [EKG]: Secondary | ICD-10-CM | POA: Diagnosis not present

## 2021-02-03 DIAGNOSIS — I82621 Acute embolism and thrombosis of deep veins of right upper extremity: Secondary | ICD-10-CM | POA: Diagnosis not present

## 2021-02-03 DIAGNOSIS — Z4822 Encounter for aftercare following kidney transplant: Secondary | ICD-10-CM | POA: Diagnosis not present

## 2021-02-03 DIAGNOSIS — T8619 Other complication of kidney transplant: Secondary | ICD-10-CM | POA: Diagnosis not present

## 2021-02-03 DIAGNOSIS — Z940 Kidney transplant status: Secondary | ICD-10-CM | POA: Diagnosis not present

## 2021-02-03 DIAGNOSIS — R109 Unspecified abdominal pain: Secondary | ICD-10-CM | POA: Diagnosis not present

## 2021-02-03 DIAGNOSIS — I120 Hypertensive chronic kidney disease with stage 5 chronic kidney disease or end stage renal disease: Secondary | ICD-10-CM | POA: Diagnosis not present

## 2021-02-03 DIAGNOSIS — N186 End stage renal disease: Secondary | ICD-10-CM | POA: Diagnosis not present

## 2021-02-03 DIAGNOSIS — E872 Acidosis, unspecified: Secondary | ICD-10-CM | POA: Diagnosis not present

## 2021-02-03 DIAGNOSIS — D649 Anemia, unspecified: Secondary | ICD-10-CM | POA: Diagnosis not present

## 2021-02-03 DIAGNOSIS — Z5181 Encounter for therapeutic drug level monitoring: Secondary | ICD-10-CM | POA: Diagnosis not present

## 2021-02-03 DIAGNOSIS — D696 Thrombocytopenia, unspecified: Secondary | ICD-10-CM | POA: Diagnosis not present

## 2021-02-03 DIAGNOSIS — I742 Embolism and thrombosis of arteries of the upper extremities: Secondary | ICD-10-CM | POA: Diagnosis not present

## 2021-02-03 DIAGNOSIS — E878 Other disorders of electrolyte and fluid balance, not elsewhere classified: Secondary | ICD-10-CM | POA: Diagnosis not present

## 2021-02-03 DIAGNOSIS — I878 Other specified disorders of veins: Secondary | ICD-10-CM | POA: Diagnosis not present

## 2021-02-03 DIAGNOSIS — E875 Hyperkalemia: Secondary | ICD-10-CM | POA: Diagnosis not present

## 2021-02-03 DIAGNOSIS — Z992 Dependence on renal dialysis: Secondary | ICD-10-CM | POA: Diagnosis not present

## 2021-02-04 DIAGNOSIS — D62 Acute posthemorrhagic anemia: Secondary | ICD-10-CM | POA: Diagnosis not present

## 2021-02-04 DIAGNOSIS — I10 Essential (primary) hypertension: Secondary | ICD-10-CM | POA: Diagnosis not present

## 2021-02-04 DIAGNOSIS — I82621 Acute embolism and thrombosis of deep veins of right upper extremity: Secondary | ICD-10-CM | POA: Diagnosis not present

## 2021-02-04 DIAGNOSIS — Z940 Kidney transplant status: Secondary | ICD-10-CM | POA: Diagnosis not present

## 2021-02-04 DIAGNOSIS — N1330 Unspecified hydronephrosis: Secondary | ICD-10-CM | POA: Diagnosis not present

## 2021-02-04 DIAGNOSIS — N186 End stage renal disease: Secondary | ICD-10-CM | POA: Diagnosis not present

## 2021-02-04 DIAGNOSIS — Z992 Dependence on renal dialysis: Secondary | ICD-10-CM | POA: Diagnosis not present

## 2021-02-04 DIAGNOSIS — I120 Hypertensive chronic kidney disease with stage 5 chronic kidney disease or end stage renal disease: Secondary | ICD-10-CM | POA: Diagnosis not present

## 2021-02-05 DIAGNOSIS — Z992 Dependence on renal dialysis: Secondary | ICD-10-CM | POA: Diagnosis not present

## 2021-02-05 DIAGNOSIS — N186 End stage renal disease: Secondary | ICD-10-CM | POA: Diagnosis not present

## 2021-02-06 DIAGNOSIS — N1330 Unspecified hydronephrosis: Secondary | ICD-10-CM | POA: Diagnosis not present

## 2021-02-06 DIAGNOSIS — T8619 Other complication of kidney transplant: Secondary | ICD-10-CM | POA: Diagnosis not present

## 2021-02-06 DIAGNOSIS — I82621 Acute embolism and thrombosis of deep veins of right upper extremity: Secondary | ICD-10-CM | POA: Diagnosis not present

## 2021-02-06 DIAGNOSIS — Z992 Dependence on renal dialysis: Secondary | ICD-10-CM | POA: Diagnosis not present

## 2021-02-06 DIAGNOSIS — N186 End stage renal disease: Secondary | ICD-10-CM | POA: Diagnosis not present

## 2021-02-06 DIAGNOSIS — D62 Acute posthemorrhagic anemia: Secondary | ICD-10-CM | POA: Diagnosis not present

## 2021-02-06 DIAGNOSIS — I82721 Chronic embolism and thrombosis of deep veins of right upper extremity: Secondary | ICD-10-CM | POA: Diagnosis not present

## 2021-02-06 DIAGNOSIS — Z940 Kidney transplant status: Secondary | ICD-10-CM | POA: Diagnosis not present

## 2021-02-06 DIAGNOSIS — I120 Hypertensive chronic kidney disease with stage 5 chronic kidney disease or end stage renal disease: Secondary | ICD-10-CM | POA: Diagnosis not present

## 2021-02-07 DIAGNOSIS — Z5181 Encounter for therapeutic drug level monitoring: Secondary | ICD-10-CM | POA: Diagnosis not present

## 2021-02-07 DIAGNOSIS — Z992 Dependence on renal dialysis: Secondary | ICD-10-CM | POA: Diagnosis not present

## 2021-02-07 DIAGNOSIS — I10 Essential (primary) hypertension: Secondary | ICD-10-CM | POA: Diagnosis not present

## 2021-02-07 DIAGNOSIS — I2510 Atherosclerotic heart disease of native coronary artery without angina pectoris: Secondary | ICD-10-CM | POA: Diagnosis not present

## 2021-02-07 DIAGNOSIS — Z940 Kidney transplant status: Secondary | ICD-10-CM | POA: Diagnosis not present

## 2021-02-07 DIAGNOSIS — I120 Hypertensive chronic kidney disease with stage 5 chronic kidney disease or end stage renal disease: Secondary | ICD-10-CM | POA: Diagnosis not present

## 2021-02-07 DIAGNOSIS — I82621 Acute embolism and thrombosis of deep veins of right upper extremity: Secondary | ICD-10-CM | POA: Diagnosis not present

## 2021-02-07 DIAGNOSIS — N186 End stage renal disease: Secondary | ICD-10-CM | POA: Diagnosis not present

## 2021-02-08 DIAGNOSIS — Z940 Kidney transplant status: Secondary | ICD-10-CM | POA: Diagnosis not present

## 2021-02-08 DIAGNOSIS — I120 Hypertensive chronic kidney disease with stage 5 chronic kidney disease or end stage renal disease: Secondary | ICD-10-CM | POA: Diagnosis not present

## 2021-02-08 DIAGNOSIS — I10 Essential (primary) hypertension: Secondary | ICD-10-CM | POA: Diagnosis not present

## 2021-02-08 DIAGNOSIS — I82621 Acute embolism and thrombosis of deep veins of right upper extremity: Secondary | ICD-10-CM | POA: Diagnosis not present

## 2021-02-08 DIAGNOSIS — Z5181 Encounter for therapeutic drug level monitoring: Secondary | ICD-10-CM | POA: Diagnosis not present

## 2021-02-08 DIAGNOSIS — N186 End stage renal disease: Secondary | ICD-10-CM | POA: Diagnosis not present

## 2021-02-08 DIAGNOSIS — Z992 Dependence on renal dialysis: Secondary | ICD-10-CM | POA: Diagnosis not present

## 2021-02-08 DIAGNOSIS — I2510 Atherosclerotic heart disease of native coronary artery without angina pectoris: Secondary | ICD-10-CM | POA: Diagnosis not present

## 2021-02-09 ENCOUNTER — Other Ambulatory Visit: Payer: Self-pay | Admitting: Cardiovascular Disease

## 2021-02-09 DIAGNOSIS — I120 Hypertensive chronic kidney disease with stage 5 chronic kidney disease or end stage renal disease: Secondary | ICD-10-CM | POA: Diagnosis not present

## 2021-02-09 DIAGNOSIS — Z992 Dependence on renal dialysis: Secondary | ICD-10-CM | POA: Diagnosis not present

## 2021-02-09 DIAGNOSIS — I10 Essential (primary) hypertension: Secondary | ICD-10-CM | POA: Diagnosis not present

## 2021-02-09 DIAGNOSIS — N186 End stage renal disease: Secondary | ICD-10-CM | POA: Diagnosis not present

## 2021-02-09 DIAGNOSIS — B370 Candidal stomatitis: Secondary | ICD-10-CM | POA: Diagnosis not present

## 2021-02-09 DIAGNOSIS — Z5181 Encounter for therapeutic drug level monitoring: Secondary | ICD-10-CM | POA: Diagnosis not present

## 2021-02-09 DIAGNOSIS — Z940 Kidney transplant status: Secondary | ICD-10-CM | POA: Diagnosis not present

## 2021-02-09 DIAGNOSIS — I82621 Acute embolism and thrombosis of deep veins of right upper extremity: Secondary | ICD-10-CM | POA: Diagnosis not present

## 2021-02-09 DIAGNOSIS — R1310 Dysphagia, unspecified: Secondary | ICD-10-CM | POA: Diagnosis not present

## 2021-02-09 DIAGNOSIS — R1312 Dysphagia, oropharyngeal phase: Secondary | ICD-10-CM | POA: Diagnosis not present

## 2021-02-09 DIAGNOSIS — R633 Feeding difficulties, unspecified: Secondary | ICD-10-CM | POA: Diagnosis not present

## 2021-02-09 DIAGNOSIS — I2510 Atherosclerotic heart disease of native coronary artery without angina pectoris: Secondary | ICD-10-CM | POA: Diagnosis not present

## 2021-02-10 DIAGNOSIS — Z992 Dependence on renal dialysis: Secondary | ICD-10-CM | POA: Diagnosis not present

## 2021-02-10 DIAGNOSIS — I120 Hypertensive chronic kidney disease with stage 5 chronic kidney disease or end stage renal disease: Secondary | ICD-10-CM | POA: Diagnosis not present

## 2021-02-10 DIAGNOSIS — I2510 Atherosclerotic heart disease of native coronary artery without angina pectoris: Secondary | ICD-10-CM | POA: Diagnosis not present

## 2021-02-10 DIAGNOSIS — N186 End stage renal disease: Secondary | ICD-10-CM | POA: Diagnosis not present

## 2021-02-10 DIAGNOSIS — Z940 Kidney transplant status: Secondary | ICD-10-CM | POA: Diagnosis not present

## 2021-02-10 DIAGNOSIS — I10 Essential (primary) hypertension: Secondary | ICD-10-CM | POA: Diagnosis not present

## 2021-02-10 DIAGNOSIS — I82621 Acute embolism and thrombosis of deep veins of right upper extremity: Secondary | ICD-10-CM | POA: Diagnosis not present

## 2021-02-10 DIAGNOSIS — Z5181 Encounter for therapeutic drug level monitoring: Secondary | ICD-10-CM | POA: Diagnosis not present

## 2021-02-11 DIAGNOSIS — Z992 Dependence on renal dialysis: Secondary | ICD-10-CM | POA: Diagnosis not present

## 2021-02-11 DIAGNOSIS — I2510 Atherosclerotic heart disease of native coronary artery without angina pectoris: Secondary | ICD-10-CM | POA: Diagnosis not present

## 2021-02-11 DIAGNOSIS — Z5181 Encounter for therapeutic drug level monitoring: Secondary | ICD-10-CM | POA: Diagnosis not present

## 2021-02-11 DIAGNOSIS — I10 Essential (primary) hypertension: Secondary | ICD-10-CM | POA: Diagnosis not present

## 2021-02-11 DIAGNOSIS — I120 Hypertensive chronic kidney disease with stage 5 chronic kidney disease or end stage renal disease: Secondary | ICD-10-CM | POA: Diagnosis not present

## 2021-02-11 DIAGNOSIS — Z940 Kidney transplant status: Secondary | ICD-10-CM | POA: Diagnosis not present

## 2021-02-11 DIAGNOSIS — N186 End stage renal disease: Secondary | ICD-10-CM | POA: Diagnosis not present

## 2021-02-11 DIAGNOSIS — I82621 Acute embolism and thrombosis of deep veins of right upper extremity: Secondary | ICD-10-CM | POA: Diagnosis not present

## 2021-02-12 DIAGNOSIS — N186 End stage renal disease: Secondary | ICD-10-CM | POA: Diagnosis not present

## 2021-02-12 DIAGNOSIS — I2510 Atherosclerotic heart disease of native coronary artery without angina pectoris: Secondary | ICD-10-CM | POA: Diagnosis not present

## 2021-02-12 DIAGNOSIS — Z5181 Encounter for therapeutic drug level monitoring: Secondary | ICD-10-CM | POA: Diagnosis not present

## 2021-02-12 DIAGNOSIS — Z992 Dependence on renal dialysis: Secondary | ICD-10-CM | POA: Diagnosis not present

## 2021-02-12 DIAGNOSIS — I10 Essential (primary) hypertension: Secondary | ICD-10-CM | POA: Diagnosis not present

## 2021-02-12 DIAGNOSIS — Z940 Kidney transplant status: Secondary | ICD-10-CM | POA: Diagnosis not present

## 2021-02-12 DIAGNOSIS — T8619 Other complication of kidney transplant: Secondary | ICD-10-CM | POA: Diagnosis not present

## 2021-02-12 DIAGNOSIS — I120 Hypertensive chronic kidney disease with stage 5 chronic kidney disease or end stage renal disease: Secondary | ICD-10-CM | POA: Diagnosis not present

## 2021-02-13 DIAGNOSIS — N186 End stage renal disease: Secondary | ICD-10-CM | POA: Diagnosis not present

## 2021-02-13 DIAGNOSIS — I770 Arteriovenous fistula, acquired: Secondary | ICD-10-CM | POA: Diagnosis not present

## 2021-02-13 DIAGNOSIS — T8619 Other complication of kidney transplant: Secondary | ICD-10-CM | POA: Diagnosis not present

## 2021-02-13 DIAGNOSIS — Z992 Dependence on renal dialysis: Secondary | ICD-10-CM | POA: Diagnosis not present

## 2021-02-13 DIAGNOSIS — Z5181 Encounter for therapeutic drug level monitoring: Secondary | ICD-10-CM | POA: Diagnosis not present

## 2021-02-13 DIAGNOSIS — I120 Hypertensive chronic kidney disease with stage 5 chronic kidney disease or end stage renal disease: Secondary | ICD-10-CM | POA: Diagnosis not present

## 2021-02-13 DIAGNOSIS — I2510 Atherosclerotic heart disease of native coronary artery without angina pectoris: Secondary | ICD-10-CM | POA: Diagnosis not present

## 2021-02-13 DIAGNOSIS — K439 Ventral hernia without obstruction or gangrene: Secondary | ICD-10-CM | POA: Diagnosis not present

## 2021-02-13 DIAGNOSIS — Z940 Kidney transplant status: Secondary | ICD-10-CM | POA: Diagnosis not present

## 2021-02-13 DIAGNOSIS — I10 Essential (primary) hypertension: Secondary | ICD-10-CM | POA: Diagnosis not present

## 2021-02-14 DIAGNOSIS — N186 End stage renal disease: Secondary | ICD-10-CM | POA: Diagnosis not present

## 2021-02-14 DIAGNOSIS — N269 Renal sclerosis, unspecified: Secondary | ICD-10-CM | POA: Diagnosis not present

## 2021-02-14 DIAGNOSIS — Z940 Kidney transplant status: Secondary | ICD-10-CM | POA: Diagnosis not present

## 2021-02-14 DIAGNOSIS — K432 Incisional hernia without obstruction or gangrene: Secondary | ICD-10-CM | POA: Diagnosis not present

## 2021-02-14 DIAGNOSIS — T8132XA Disruption of internal operation (surgical) wound, not elsewhere classified, initial encounter: Secondary | ICD-10-CM | POA: Diagnosis not present

## 2021-02-14 DIAGNOSIS — I120 Hypertensive chronic kidney disease with stage 5 chronic kidney disease or end stage renal disease: Secondary | ICD-10-CM | POA: Diagnosis not present

## 2021-02-14 DIAGNOSIS — Z992 Dependence on renal dialysis: Secondary | ICD-10-CM | POA: Diagnosis not present

## 2021-02-14 DIAGNOSIS — T8131XA Disruption of external operation (surgical) wound, not elsewhere classified, initial encounter: Secondary | ICD-10-CM | POA: Diagnosis not present

## 2021-02-14 DIAGNOSIS — I82621 Acute embolism and thrombosis of deep veins of right upper extremity: Secondary | ICD-10-CM | POA: Diagnosis not present

## 2021-02-14 DIAGNOSIS — I701 Atherosclerosis of renal artery: Secondary | ICD-10-CM | POA: Diagnosis not present

## 2021-02-15 DIAGNOSIS — Z5181 Encounter for therapeutic drug level monitoring: Secondary | ICD-10-CM | POA: Diagnosis not present

## 2021-02-15 DIAGNOSIS — Z940 Kidney transplant status: Secondary | ICD-10-CM | POA: Diagnosis not present

## 2021-02-15 DIAGNOSIS — I82621 Acute embolism and thrombosis of deep veins of right upper extremity: Secondary | ICD-10-CM | POA: Diagnosis not present

## 2021-02-15 DIAGNOSIS — T22231S Burn of second degree of right upper arm, sequela: Secondary | ICD-10-CM | POA: Diagnosis not present

## 2021-02-15 DIAGNOSIS — N186 End stage renal disease: Secondary | ICD-10-CM | POA: Diagnosis not present

## 2021-02-15 DIAGNOSIS — I120 Hypertensive chronic kidney disease with stage 5 chronic kidney disease or end stage renal disease: Secondary | ICD-10-CM | POA: Diagnosis not present

## 2021-02-15 DIAGNOSIS — I2510 Atherosclerotic heart disease of native coronary artery without angina pectoris: Secondary | ICD-10-CM | POA: Diagnosis not present

## 2021-02-15 DIAGNOSIS — I10 Essential (primary) hypertension: Secondary | ICD-10-CM | POA: Diagnosis not present

## 2021-02-15 DIAGNOSIS — Z992 Dependence on renal dialysis: Secondary | ICD-10-CM | POA: Diagnosis not present

## 2021-02-16 DIAGNOSIS — I10 Essential (primary) hypertension: Secondary | ICD-10-CM | POA: Diagnosis not present

## 2021-02-16 DIAGNOSIS — I120 Hypertensive chronic kidney disease with stage 5 chronic kidney disease or end stage renal disease: Secondary | ICD-10-CM | POA: Diagnosis not present

## 2021-02-16 DIAGNOSIS — Z940 Kidney transplant status: Secondary | ICD-10-CM | POA: Diagnosis not present

## 2021-02-16 DIAGNOSIS — Z992 Dependence on renal dialysis: Secondary | ICD-10-CM | POA: Diagnosis not present

## 2021-02-16 DIAGNOSIS — I82621 Acute embolism and thrombosis of deep veins of right upper extremity: Secondary | ICD-10-CM | POA: Diagnosis not present

## 2021-02-16 DIAGNOSIS — N186 End stage renal disease: Secondary | ICD-10-CM | POA: Diagnosis not present

## 2021-02-16 DIAGNOSIS — I2510 Atherosclerotic heart disease of native coronary artery without angina pectoris: Secondary | ICD-10-CM | POA: Diagnosis not present

## 2021-02-16 DIAGNOSIS — Z5181 Encounter for therapeutic drug level monitoring: Secondary | ICD-10-CM | POA: Diagnosis not present

## 2021-02-17 DIAGNOSIS — Z992 Dependence on renal dialysis: Secondary | ICD-10-CM | POA: Diagnosis not present

## 2021-02-17 DIAGNOSIS — Z4822 Encounter for aftercare following kidney transplant: Secondary | ICD-10-CM | POA: Diagnosis not present

## 2021-02-17 DIAGNOSIS — N1330 Unspecified hydronephrosis: Secondary | ICD-10-CM | POA: Diagnosis not present

## 2021-02-17 DIAGNOSIS — Z940 Kidney transplant status: Secondary | ICD-10-CM | POA: Diagnosis not present

## 2021-02-17 DIAGNOSIS — Z5181 Encounter for therapeutic drug level monitoring: Secondary | ICD-10-CM | POA: Diagnosis not present

## 2021-02-17 DIAGNOSIS — D62 Acute posthemorrhagic anemia: Secondary | ICD-10-CM | POA: Diagnosis not present

## 2021-02-17 DIAGNOSIS — I120 Hypertensive chronic kidney disease with stage 5 chronic kidney disease or end stage renal disease: Secondary | ICD-10-CM | POA: Diagnosis not present

## 2021-02-17 DIAGNOSIS — I10 Essential (primary) hypertension: Secondary | ICD-10-CM | POA: Diagnosis not present

## 2021-02-17 DIAGNOSIS — I82621 Acute embolism and thrombosis of deep veins of right upper extremity: Secondary | ICD-10-CM | POA: Diagnosis not present

## 2021-02-17 DIAGNOSIS — N186 End stage renal disease: Secondary | ICD-10-CM | POA: Diagnosis not present

## 2021-02-18 DIAGNOSIS — N186 End stage renal disease: Secondary | ICD-10-CM | POA: Diagnosis not present

## 2021-02-18 DIAGNOSIS — R5381 Other malaise: Secondary | ICD-10-CM | POA: Diagnosis not present

## 2021-02-18 DIAGNOSIS — K2100 Gastro-esophageal reflux disease with esophagitis, without bleeding: Secondary | ICD-10-CM | POA: Diagnosis not present

## 2021-02-18 DIAGNOSIS — M7989 Other specified soft tissue disorders: Secondary | ICD-10-CM | POA: Diagnosis not present

## 2021-02-18 DIAGNOSIS — N185 Chronic kidney disease, stage 5: Secondary | ICD-10-CM | POA: Diagnosis not present

## 2021-02-18 DIAGNOSIS — I2510 Atherosclerotic heart disease of native coronary artery without angina pectoris: Secondary | ICD-10-CM | POA: Diagnosis not present

## 2021-02-18 DIAGNOSIS — M329 Systemic lupus erythematosus, unspecified: Secondary | ICD-10-CM | POA: Diagnosis not present

## 2021-02-18 DIAGNOSIS — I120 Hypertensive chronic kidney disease with stage 5 chronic kidney disease or end stage renal disease: Secondary | ICD-10-CM | POA: Diagnosis not present

## 2021-02-18 DIAGNOSIS — D649 Anemia, unspecified: Secondary | ICD-10-CM | POA: Diagnosis not present

## 2021-02-18 DIAGNOSIS — I10 Essential (primary) hypertension: Secondary | ICD-10-CM | POA: Diagnosis not present

## 2021-02-18 DIAGNOSIS — Z7982 Long term (current) use of aspirin: Secondary | ICD-10-CM | POA: Diagnosis not present

## 2021-02-18 DIAGNOSIS — Z992 Dependence on renal dialysis: Secondary | ICD-10-CM | POA: Diagnosis not present

## 2021-02-18 DIAGNOSIS — Z940 Kidney transplant status: Secondary | ICD-10-CM | POA: Diagnosis not present

## 2021-02-18 DIAGNOSIS — Z7409 Other reduced mobility: Secondary | ICD-10-CM | POA: Diagnosis not present

## 2021-02-18 DIAGNOSIS — Z741 Need for assistance with personal care: Secondary | ICD-10-CM | POA: Diagnosis not present

## 2021-02-18 DIAGNOSIS — I82621 Acute embolism and thrombosis of deep veins of right upper extremity: Secondary | ICD-10-CM | POA: Diagnosis not present

## 2021-02-21 DIAGNOSIS — R5381 Other malaise: Secondary | ICD-10-CM | POA: Diagnosis not present

## 2021-02-22 DIAGNOSIS — Z940 Kidney transplant status: Secondary | ICD-10-CM | POA: Diagnosis not present

## 2021-02-22 DIAGNOSIS — R5381 Other malaise: Secondary | ICD-10-CM | POA: Diagnosis not present

## 2021-02-23 DIAGNOSIS — R5381 Other malaise: Secondary | ICD-10-CM | POA: Diagnosis not present

## 2021-02-23 DIAGNOSIS — Z7409 Other reduced mobility: Secondary | ICD-10-CM | POA: Diagnosis not present

## 2021-02-23 DIAGNOSIS — Z7982 Long term (current) use of aspirin: Secondary | ICD-10-CM | POA: Diagnosis not present

## 2021-02-23 DIAGNOSIS — K2100 Gastro-esophageal reflux disease with esophagitis, without bleeding: Secondary | ICD-10-CM | POA: Diagnosis not present

## 2021-02-23 DIAGNOSIS — N185 Chronic kidney disease, stage 5: Secondary | ICD-10-CM | POA: Diagnosis not present

## 2021-02-23 DIAGNOSIS — D649 Anemia, unspecified: Secondary | ICD-10-CM | POA: Diagnosis not present

## 2021-02-28 DIAGNOSIS — I129 Hypertensive chronic kidney disease with stage 1 through stage 4 chronic kidney disease, or unspecified chronic kidney disease: Secondary | ICD-10-CM | POA: Diagnosis not present

## 2021-02-28 DIAGNOSIS — N189 Chronic kidney disease, unspecified: Secondary | ICD-10-CM | POA: Diagnosis not present

## 2021-02-28 DIAGNOSIS — R112 Nausea with vomiting, unspecified: Secondary | ICD-10-CM | POA: Diagnosis not present

## 2021-02-28 DIAGNOSIS — Z792 Long term (current) use of antibiotics: Secondary | ICD-10-CM | POA: Diagnosis not present

## 2021-02-28 DIAGNOSIS — Z79899 Other long term (current) drug therapy: Secondary | ICD-10-CM | POA: Diagnosis not present

## 2021-02-28 DIAGNOSIS — D631 Anemia in chronic kidney disease: Secondary | ICD-10-CM | POA: Diagnosis not present

## 2021-02-28 DIAGNOSIS — Z4822 Encounter for aftercare following kidney transplant: Secondary | ICD-10-CM | POA: Diagnosis not present

## 2021-02-28 DIAGNOSIS — Z7952 Long term (current) use of systemic steroids: Secondary | ICD-10-CM | POA: Diagnosis not present

## 2021-02-28 DIAGNOSIS — R197 Diarrhea, unspecified: Secondary | ICD-10-CM | POA: Diagnosis not present

## 2021-02-28 DIAGNOSIS — E785 Hyperlipidemia, unspecified: Secondary | ICD-10-CM | POA: Diagnosis not present

## 2021-03-01 DIAGNOSIS — Z940 Kidney transplant status: Secondary | ICD-10-CM | POA: Diagnosis not present

## 2021-03-03 DIAGNOSIS — Z940 Kidney transplant status: Secondary | ICD-10-CM | POA: Diagnosis not present

## 2021-03-03 DIAGNOSIS — Z01818 Encounter for other preprocedural examination: Secondary | ICD-10-CM | POA: Diagnosis not present

## 2021-03-03 DIAGNOSIS — Z7952 Long term (current) use of systemic steroids: Secondary | ICD-10-CM | POA: Diagnosis not present

## 2021-03-03 DIAGNOSIS — Z7960 Long term (current) use of unspecified immunomodulators and immunosuppressants: Secondary | ICD-10-CM | POA: Diagnosis not present

## 2021-03-03 DIAGNOSIS — M3214 Glomerular disease in systemic lupus erythematosus: Secondary | ICD-10-CM | POA: Diagnosis not present

## 2021-03-03 DIAGNOSIS — Z4822 Encounter for aftercare following kidney transplant: Secondary | ICD-10-CM | POA: Diagnosis not present

## 2021-03-03 DIAGNOSIS — K219 Gastro-esophageal reflux disease without esophagitis: Secondary | ICD-10-CM | POA: Diagnosis not present

## 2021-03-03 DIAGNOSIS — Z79899 Other long term (current) drug therapy: Secondary | ICD-10-CM | POA: Diagnosis not present

## 2021-03-03 DIAGNOSIS — I2510 Atherosclerotic heart disease of native coronary artery without angina pectoris: Secondary | ICD-10-CM | POA: Diagnosis not present

## 2021-03-03 DIAGNOSIS — Z885 Allergy status to narcotic agent status: Secondary | ICD-10-CM | POA: Diagnosis not present

## 2021-03-03 DIAGNOSIS — I10 Essential (primary) hypertension: Secondary | ICD-10-CM | POA: Diagnosis not present

## 2021-03-03 DIAGNOSIS — Z792 Long term (current) use of antibiotics: Secondary | ICD-10-CM | POA: Diagnosis not present

## 2021-03-03 DIAGNOSIS — D649 Anemia, unspecified: Secondary | ICD-10-CM | POA: Diagnosis not present

## 2021-03-03 DIAGNOSIS — R110 Nausea: Secondary | ICD-10-CM | POA: Diagnosis not present

## 2021-03-03 DIAGNOSIS — Z888 Allergy status to other drugs, medicaments and biological substances status: Secondary | ICD-10-CM | POA: Diagnosis not present

## 2021-03-03 DIAGNOSIS — Z9889 Other specified postprocedural states: Secondary | ICD-10-CM | POA: Diagnosis not present

## 2021-03-03 DIAGNOSIS — Z79621 Long term (current) use of calcineurin inhibitor: Secondary | ICD-10-CM | POA: Diagnosis not present

## 2021-03-03 DIAGNOSIS — E785 Hyperlipidemia, unspecified: Secondary | ICD-10-CM | POA: Diagnosis not present

## 2021-03-03 DIAGNOSIS — D849 Immunodeficiency, unspecified: Secondary | ICD-10-CM | POA: Diagnosis not present

## 2021-03-03 DIAGNOSIS — D84821 Immunodeficiency due to drugs: Secondary | ICD-10-CM | POA: Diagnosis not present

## 2021-03-03 DIAGNOSIS — K121 Other forms of stomatitis: Secondary | ICD-10-CM | POA: Diagnosis not present

## 2021-03-07 DIAGNOSIS — K219 Gastro-esophageal reflux disease without esophagitis: Secondary | ICD-10-CM | POA: Diagnosis not present

## 2021-03-07 DIAGNOSIS — Z792 Long term (current) use of antibiotics: Secondary | ICD-10-CM | POA: Diagnosis not present

## 2021-03-07 DIAGNOSIS — M329 Systemic lupus erythematosus, unspecified: Secondary | ICD-10-CM | POA: Diagnosis not present

## 2021-03-07 DIAGNOSIS — R7989 Other specified abnormal findings of blood chemistry: Secondary | ICD-10-CM | POA: Diagnosis not present

## 2021-03-07 DIAGNOSIS — Z955 Presence of coronary angioplasty implant and graft: Secondary | ICD-10-CM | POA: Diagnosis not present

## 2021-03-07 DIAGNOSIS — Z79899 Other long term (current) drug therapy: Secondary | ICD-10-CM | POA: Diagnosis not present

## 2021-03-07 DIAGNOSIS — E785 Hyperlipidemia, unspecified: Secondary | ICD-10-CM | POA: Diagnosis not present

## 2021-03-07 DIAGNOSIS — D849 Immunodeficiency, unspecified: Secondary | ICD-10-CM | POA: Diagnosis not present

## 2021-03-07 DIAGNOSIS — Z4822 Encounter for aftercare following kidney transplant: Secondary | ICD-10-CM | POA: Diagnosis not present

## 2021-03-07 DIAGNOSIS — N184 Chronic kidney disease, stage 4 (severe): Secondary | ICD-10-CM | POA: Diagnosis not present

## 2021-03-07 DIAGNOSIS — K1230 Oral mucositis (ulcerative), unspecified: Secondary | ICD-10-CM | POA: Diagnosis not present

## 2021-03-07 DIAGNOSIS — R1903 Right lower quadrant abdominal swelling, mass and lump: Secondary | ICD-10-CM | POA: Diagnosis not present

## 2021-03-07 DIAGNOSIS — Z9889 Other specified postprocedural states: Secondary | ICD-10-CM | POA: Diagnosis not present

## 2021-03-07 DIAGNOSIS — Z940 Kidney transplant status: Secondary | ICD-10-CM | POA: Diagnosis not present

## 2021-03-07 DIAGNOSIS — N289 Disorder of kidney and ureter, unspecified: Secondary | ICD-10-CM | POA: Diagnosis not present

## 2021-03-07 DIAGNOSIS — I2510 Atherosclerotic heart disease of native coronary artery without angina pectoris: Secondary | ICD-10-CM | POA: Diagnosis not present

## 2021-03-07 DIAGNOSIS — I3139 Other pericardial effusion (noninflammatory): Secondary | ICD-10-CM | POA: Diagnosis not present

## 2021-03-07 DIAGNOSIS — D649 Anemia, unspecified: Secondary | ICD-10-CM | POA: Diagnosis not present

## 2021-03-07 DIAGNOSIS — I150 Renovascular hypertension: Secondary | ICD-10-CM | POA: Diagnosis not present

## 2021-03-07 DIAGNOSIS — I119 Hypertensive heart disease without heart failure: Secondary | ICD-10-CM | POA: Diagnosis not present

## 2021-03-07 DIAGNOSIS — Z7952 Long term (current) use of systemic steroids: Secondary | ICD-10-CM | POA: Diagnosis not present

## 2021-03-07 DIAGNOSIS — R251 Tremor, unspecified: Secondary | ICD-10-CM | POA: Diagnosis not present

## 2021-03-07 DIAGNOSIS — Z95828 Presence of other vascular implants and grafts: Secondary | ICD-10-CM | POA: Diagnosis not present

## 2021-03-08 DIAGNOSIS — Z949 Transplanted organ and tissue status, unspecified: Secondary | ICD-10-CM | POA: Diagnosis not present

## 2021-03-08 DIAGNOSIS — K2289 Other specified disease of esophagus: Secondary | ICD-10-CM | POA: Diagnosis not present

## 2021-03-08 DIAGNOSIS — K209 Esophagitis, unspecified without bleeding: Secondary | ICD-10-CM | POA: Diagnosis not present

## 2021-03-08 DIAGNOSIS — K208 Other esophagitis without bleeding: Secondary | ICD-10-CM | POA: Diagnosis not present

## 2021-03-08 DIAGNOSIS — R1310 Dysphagia, unspecified: Secondary | ICD-10-CM | POA: Diagnosis not present

## 2021-03-10 DIAGNOSIS — Z5181 Encounter for therapeutic drug level monitoring: Secondary | ICD-10-CM | POA: Diagnosis not present

## 2021-03-10 DIAGNOSIS — K219 Gastro-esophageal reflux disease without esophagitis: Secondary | ICD-10-CM | POA: Diagnosis not present

## 2021-03-10 DIAGNOSIS — K121 Other forms of stomatitis: Secondary | ICD-10-CM | POA: Diagnosis not present

## 2021-03-10 DIAGNOSIS — M329 Systemic lupus erythematosus, unspecified: Secondary | ICD-10-CM | POA: Diagnosis not present

## 2021-03-10 DIAGNOSIS — K209 Esophagitis, unspecified without bleeding: Secondary | ICD-10-CM | POA: Diagnosis not present

## 2021-03-10 DIAGNOSIS — R110 Nausea: Secondary | ICD-10-CM | POA: Diagnosis not present

## 2021-03-10 DIAGNOSIS — E785 Hyperlipidemia, unspecified: Secondary | ICD-10-CM | POA: Diagnosis not present

## 2021-03-10 DIAGNOSIS — Z7952 Long term (current) use of systemic steroids: Secondary | ICD-10-CM | POA: Diagnosis not present

## 2021-03-10 DIAGNOSIS — I150 Renovascular hypertension: Secondary | ICD-10-CM | POA: Diagnosis not present

## 2021-03-10 DIAGNOSIS — E872 Acidosis, unspecified: Secondary | ICD-10-CM | POA: Diagnosis not present

## 2021-03-10 DIAGNOSIS — Z955 Presence of coronary angioplasty implant and graft: Secondary | ICD-10-CM | POA: Diagnosis not present

## 2021-03-10 DIAGNOSIS — Z792 Long term (current) use of antibiotics: Secondary | ICD-10-CM | POA: Diagnosis not present

## 2021-03-10 DIAGNOSIS — I2510 Atherosclerotic heart disease of native coronary artery without angina pectoris: Secondary | ICD-10-CM | POA: Diagnosis not present

## 2021-03-10 DIAGNOSIS — Z940 Kidney transplant status: Secondary | ICD-10-CM | POA: Diagnosis not present

## 2021-03-10 DIAGNOSIS — D849 Immunodeficiency, unspecified: Secondary | ICD-10-CM | POA: Diagnosis not present

## 2021-03-10 DIAGNOSIS — N185 Chronic kidney disease, stage 5: Secondary | ICD-10-CM | POA: Diagnosis not present

## 2021-03-10 DIAGNOSIS — I10 Essential (primary) hypertension: Secondary | ICD-10-CM | POA: Diagnosis not present

## 2021-03-10 DIAGNOSIS — Z4822 Encounter for aftercare following kidney transplant: Secondary | ICD-10-CM | POA: Diagnosis not present

## 2021-03-15 DIAGNOSIS — E785 Hyperlipidemia, unspecified: Secondary | ICD-10-CM | POA: Diagnosis not present

## 2021-03-15 DIAGNOSIS — K121 Other forms of stomatitis: Secondary | ICD-10-CM | POA: Diagnosis not present

## 2021-03-15 DIAGNOSIS — Z7969 Long term (current) use of other immunomodulators and immunosuppressants: Secondary | ICD-10-CM | POA: Diagnosis not present

## 2021-03-15 DIAGNOSIS — K219 Gastro-esophageal reflux disease without esophagitis: Secondary | ICD-10-CM | POA: Diagnosis not present

## 2021-03-15 DIAGNOSIS — Z885 Allergy status to narcotic agent status: Secondary | ICD-10-CM | POA: Diagnosis not present

## 2021-03-15 DIAGNOSIS — M329 Systemic lupus erythematosus, unspecified: Secondary | ICD-10-CM | POA: Diagnosis not present

## 2021-03-15 DIAGNOSIS — E872 Acidosis, unspecified: Secondary | ICD-10-CM | POA: Diagnosis not present

## 2021-03-15 DIAGNOSIS — I2510 Atherosclerotic heart disease of native coronary artery without angina pectoris: Secondary | ICD-10-CM | POA: Diagnosis not present

## 2021-03-15 DIAGNOSIS — Z4822 Encounter for aftercare following kidney transplant: Secondary | ICD-10-CM | POA: Diagnosis not present

## 2021-03-15 DIAGNOSIS — D849 Immunodeficiency, unspecified: Secondary | ICD-10-CM | POA: Diagnosis not present

## 2021-03-15 DIAGNOSIS — D649 Anemia, unspecified: Secondary | ICD-10-CM | POA: Diagnosis not present

## 2021-03-15 DIAGNOSIS — Z7952 Long term (current) use of systemic steroids: Secondary | ICD-10-CM | POA: Diagnosis not present

## 2021-03-15 DIAGNOSIS — Z888 Allergy status to other drugs, medicaments and biological substances status: Secondary | ICD-10-CM | POA: Diagnosis not present

## 2021-03-15 DIAGNOSIS — I10 Essential (primary) hypertension: Secondary | ICD-10-CM | POA: Diagnosis not present

## 2021-03-15 DIAGNOSIS — Z940 Kidney transplant status: Secondary | ICD-10-CM | POA: Diagnosis not present

## 2021-03-15 DIAGNOSIS — K2289 Other specified disease of esophagus: Secondary | ICD-10-CM | POA: Diagnosis not present

## 2021-03-15 DIAGNOSIS — Z79621 Long term (current) use of calcineurin inhibitor: Secondary | ICD-10-CM | POA: Diagnosis not present

## 2021-03-15 DIAGNOSIS — Z955 Presence of coronary angioplasty implant and graft: Secondary | ICD-10-CM | POA: Diagnosis not present

## 2021-03-15 DIAGNOSIS — I150 Renovascular hypertension: Secondary | ICD-10-CM | POA: Diagnosis not present

## 2021-03-16 DIAGNOSIS — Z7952 Long term (current) use of systemic steroids: Secondary | ICD-10-CM | POA: Diagnosis not present

## 2021-03-16 DIAGNOSIS — Z792 Long term (current) use of antibiotics: Secondary | ICD-10-CM | POA: Diagnosis not present

## 2021-03-16 DIAGNOSIS — Z5181 Encounter for therapeutic drug level monitoring: Secondary | ICD-10-CM | POA: Diagnosis not present

## 2021-03-16 DIAGNOSIS — D849 Immunodeficiency, unspecified: Secondary | ICD-10-CM | POA: Diagnosis not present

## 2021-03-16 DIAGNOSIS — Z4822 Encounter for aftercare following kidney transplant: Secondary | ICD-10-CM | POA: Diagnosis not present

## 2021-03-16 DIAGNOSIS — D631 Anemia in chronic kidney disease: Secondary | ICD-10-CM | POA: Diagnosis not present

## 2021-03-16 DIAGNOSIS — Z79899 Other long term (current) drug therapy: Secondary | ICD-10-CM | POA: Diagnosis not present

## 2021-03-17 DIAGNOSIS — M3214 Glomerular disease in systemic lupus erythematosus: Secondary | ICD-10-CM | POA: Diagnosis not present

## 2021-03-17 DIAGNOSIS — Z940 Kidney transplant status: Secondary | ICD-10-CM | POA: Diagnosis not present

## 2021-03-17 DIAGNOSIS — M329 Systemic lupus erythematosus, unspecified: Secondary | ICD-10-CM | POA: Diagnosis not present

## 2021-03-17 DIAGNOSIS — R5381 Other malaise: Secondary | ICD-10-CM | POA: Diagnosis not present

## 2021-03-17 DIAGNOSIS — D849 Immunodeficiency, unspecified: Secondary | ICD-10-CM | POA: Diagnosis not present

## 2021-03-18 DIAGNOSIS — D849 Immunodeficiency, unspecified: Secondary | ICD-10-CM | POA: Diagnosis not present

## 2021-03-18 DIAGNOSIS — Z940 Kidney transplant status: Secondary | ICD-10-CM | POA: Diagnosis not present

## 2021-03-18 DIAGNOSIS — Z452 Encounter for adjustment and management of vascular access device: Secondary | ICD-10-CM | POA: Diagnosis not present

## 2021-03-21 DIAGNOSIS — E875 Hyperkalemia: Secondary | ICD-10-CM | POA: Diagnosis not present

## 2021-03-21 DIAGNOSIS — K219 Gastro-esophageal reflux disease without esophagitis: Secondary | ICD-10-CM | POA: Diagnosis not present

## 2021-03-21 DIAGNOSIS — M329 Systemic lupus erythematosus, unspecified: Secondary | ICD-10-CM | POA: Diagnosis not present

## 2021-03-21 DIAGNOSIS — E785 Hyperlipidemia, unspecified: Secondary | ICD-10-CM | POA: Diagnosis not present

## 2021-03-21 DIAGNOSIS — Z7969 Long term (current) use of other immunomodulators and immunosuppressants: Secondary | ICD-10-CM | POA: Diagnosis not present

## 2021-03-21 DIAGNOSIS — I10 Essential (primary) hypertension: Secondary | ICD-10-CM | POA: Diagnosis not present

## 2021-03-21 DIAGNOSIS — Z940 Kidney transplant status: Secondary | ICD-10-CM | POA: Diagnosis not present

## 2021-03-21 DIAGNOSIS — E872 Acidosis, unspecified: Secondary | ICD-10-CM | POA: Diagnosis not present

## 2021-03-21 DIAGNOSIS — Z792 Long term (current) use of antibiotics: Secondary | ICD-10-CM | POA: Diagnosis not present

## 2021-03-21 DIAGNOSIS — E119 Type 2 diabetes mellitus without complications: Secondary | ICD-10-CM | POA: Diagnosis not present

## 2021-03-21 DIAGNOSIS — Z7952 Long term (current) use of systemic steroids: Secondary | ICD-10-CM | POA: Diagnosis not present

## 2021-03-21 DIAGNOSIS — Z4822 Encounter for aftercare following kidney transplant: Secondary | ICD-10-CM | POA: Diagnosis not present

## 2021-03-21 DIAGNOSIS — N2889 Other specified disorders of kidney and ureter: Secondary | ICD-10-CM | POA: Diagnosis not present

## 2021-03-21 DIAGNOSIS — K2289 Other specified disease of esophagus: Secondary | ICD-10-CM | POA: Diagnosis not present

## 2021-03-21 DIAGNOSIS — Z79899 Other long term (current) drug therapy: Secondary | ICD-10-CM | POA: Diagnosis not present

## 2021-03-21 DIAGNOSIS — D849 Immunodeficiency, unspecified: Secondary | ICD-10-CM | POA: Diagnosis not present

## 2021-03-21 DIAGNOSIS — R0609 Other forms of dyspnea: Secondary | ICD-10-CM | POA: Diagnosis not present

## 2021-03-21 DIAGNOSIS — K209 Esophagitis, unspecified without bleeding: Secondary | ICD-10-CM | POA: Diagnosis not present

## 2021-03-22 DIAGNOSIS — Z95828 Presence of other vascular implants and grafts: Secondary | ICD-10-CM | POA: Diagnosis not present

## 2021-03-22 DIAGNOSIS — Z4822 Encounter for aftercare following kidney transplant: Secondary | ICD-10-CM | POA: Diagnosis not present

## 2021-03-24 DIAGNOSIS — E872 Acidosis, unspecified: Secondary | ICD-10-CM | POA: Diagnosis not present

## 2021-03-24 DIAGNOSIS — T8619 Other complication of kidney transplant: Secondary | ICD-10-CM | POA: Diagnosis not present

## 2021-03-24 DIAGNOSIS — I2510 Atherosclerotic heart disease of native coronary artery without angina pectoris: Secondary | ICD-10-CM | POA: Diagnosis not present

## 2021-03-24 DIAGNOSIS — Z79621 Long term (current) use of calcineurin inhibitor: Secondary | ICD-10-CM | POA: Diagnosis not present

## 2021-03-24 DIAGNOSIS — K121 Other forms of stomatitis: Secondary | ICD-10-CM | POA: Diagnosis not present

## 2021-03-24 DIAGNOSIS — I10 Essential (primary) hypertension: Secondary | ICD-10-CM | POA: Diagnosis not present

## 2021-03-24 DIAGNOSIS — R251 Tremor, unspecified: Secondary | ICD-10-CM | POA: Diagnosis not present

## 2021-03-24 DIAGNOSIS — Z792 Long term (current) use of antibiotics: Secondary | ICD-10-CM | POA: Diagnosis not present

## 2021-03-24 DIAGNOSIS — M329 Systemic lupus erythematosus, unspecified: Secondary | ICD-10-CM | POA: Diagnosis not present

## 2021-03-24 DIAGNOSIS — D72829 Elevated white blood cell count, unspecified: Secondary | ICD-10-CM | POA: Diagnosis not present

## 2021-03-24 DIAGNOSIS — R110 Nausea: Secondary | ICD-10-CM | POA: Diagnosis not present

## 2021-03-24 DIAGNOSIS — Z8719 Personal history of other diseases of the digestive system: Secondary | ICD-10-CM | POA: Diagnosis not present

## 2021-03-24 DIAGNOSIS — R5383 Other fatigue: Secondary | ICD-10-CM | POA: Diagnosis not present

## 2021-03-24 DIAGNOSIS — Z7952 Long term (current) use of systemic steroids: Secondary | ICD-10-CM | POA: Diagnosis not present

## 2021-03-24 DIAGNOSIS — D649 Anemia, unspecified: Secondary | ICD-10-CM | POA: Diagnosis not present

## 2021-03-24 DIAGNOSIS — E785 Hyperlipidemia, unspecified: Secondary | ICD-10-CM | POA: Diagnosis not present

## 2021-03-24 DIAGNOSIS — R197 Diarrhea, unspecified: Secondary | ICD-10-CM | POA: Diagnosis not present

## 2021-03-24 DIAGNOSIS — R0609 Other forms of dyspnea: Secondary | ICD-10-CM | POA: Diagnosis not present

## 2021-03-24 DIAGNOSIS — Z4822 Encounter for aftercare following kidney transplant: Secondary | ICD-10-CM | POA: Diagnosis not present

## 2021-03-24 DIAGNOSIS — Z79899 Other long term (current) drug therapy: Secondary | ICD-10-CM | POA: Diagnosis not present

## 2021-03-24 DIAGNOSIS — K2100 Gastro-esophageal reflux disease with esophagitis, without bleeding: Secondary | ICD-10-CM | POA: Diagnosis not present

## 2021-03-24 DIAGNOSIS — Z7960 Long term (current) use of unspecified immunomodulators and immunosuppressants: Secondary | ICD-10-CM | POA: Diagnosis not present

## 2021-03-29 DIAGNOSIS — D849 Immunodeficiency, unspecified: Secondary | ICD-10-CM | POA: Diagnosis not present

## 2021-03-29 DIAGNOSIS — R110 Nausea: Secondary | ICD-10-CM | POA: Diagnosis not present

## 2021-03-29 DIAGNOSIS — M3214 Glomerular disease in systemic lupus erythematosus: Secondary | ICD-10-CM | POA: Diagnosis not present

## 2021-03-29 DIAGNOSIS — Z7952 Long term (current) use of systemic steroids: Secondary | ICD-10-CM | POA: Diagnosis not present

## 2021-03-29 DIAGNOSIS — I2510 Atherosclerotic heart disease of native coronary artery without angina pectoris: Secondary | ICD-10-CM | POA: Diagnosis not present

## 2021-03-29 DIAGNOSIS — Z792 Long term (current) use of antibiotics: Secondary | ICD-10-CM | POA: Diagnosis not present

## 2021-03-29 DIAGNOSIS — D649 Anemia, unspecified: Secondary | ICD-10-CM | POA: Diagnosis not present

## 2021-03-29 DIAGNOSIS — Z79899 Other long term (current) drug therapy: Secondary | ICD-10-CM | POA: Diagnosis not present

## 2021-03-29 DIAGNOSIS — Z7960 Long term (current) use of unspecified immunomodulators and immunosuppressants: Secondary | ICD-10-CM | POA: Diagnosis not present

## 2021-03-29 DIAGNOSIS — I10 Essential (primary) hypertension: Secondary | ICD-10-CM | POA: Diagnosis not present

## 2021-03-29 DIAGNOSIS — E785 Hyperlipidemia, unspecified: Secondary | ICD-10-CM | POA: Diagnosis not present

## 2021-03-29 DIAGNOSIS — M329 Systemic lupus erythematosus, unspecified: Secondary | ICD-10-CM | POA: Diagnosis not present

## 2021-03-29 DIAGNOSIS — Z4822 Encounter for aftercare following kidney transplant: Secondary | ICD-10-CM | POA: Diagnosis not present

## 2021-03-29 DIAGNOSIS — Z940 Kidney transplant status: Secondary | ICD-10-CM | POA: Diagnosis not present

## 2021-03-29 DIAGNOSIS — R5381 Other malaise: Secondary | ICD-10-CM | POA: Diagnosis not present

## 2021-03-29 DIAGNOSIS — Z5181 Encounter for therapeutic drug level monitoring: Secondary | ICD-10-CM | POA: Diagnosis not present

## 2021-03-31 DIAGNOSIS — H2513 Age-related nuclear cataract, bilateral: Secondary | ICD-10-CM | POA: Diagnosis not present

## 2021-03-31 DIAGNOSIS — H353222 Exudative age-related macular degeneration, left eye, with inactive choroidal neovascularization: Secondary | ICD-10-CM | POA: Diagnosis not present

## 2021-03-31 DIAGNOSIS — H35712 Central serous chorioretinopathy, left eye: Secondary | ICD-10-CM | POA: Diagnosis not present

## 2021-04-01 DIAGNOSIS — D849 Immunodeficiency, unspecified: Secondary | ICD-10-CM | POA: Diagnosis not present

## 2021-04-01 DIAGNOSIS — Z4822 Encounter for aftercare following kidney transplant: Secondary | ICD-10-CM | POA: Diagnosis not present

## 2021-04-01 DIAGNOSIS — Z940 Kidney transplant status: Secondary | ICD-10-CM | POA: Diagnosis not present

## 2021-04-07 DIAGNOSIS — Z940 Kidney transplant status: Secondary | ICD-10-CM | POA: Diagnosis not present

## 2021-04-07 DIAGNOSIS — Z955 Presence of coronary angioplasty implant and graft: Secondary | ICD-10-CM | POA: Diagnosis not present

## 2021-04-07 DIAGNOSIS — Z7952 Long term (current) use of systemic steroids: Secondary | ICD-10-CM | POA: Diagnosis not present

## 2021-04-07 DIAGNOSIS — I10 Essential (primary) hypertension: Secondary | ICD-10-CM | POA: Diagnosis not present

## 2021-04-07 DIAGNOSIS — Z4822 Encounter for aftercare following kidney transplant: Secondary | ICD-10-CM | POA: Diagnosis not present

## 2021-04-07 DIAGNOSIS — E785 Hyperlipidemia, unspecified: Secondary | ICD-10-CM | POA: Diagnosis not present

## 2021-04-07 DIAGNOSIS — D649 Anemia, unspecified: Secondary | ICD-10-CM | POA: Diagnosis not present

## 2021-04-07 DIAGNOSIS — K219 Gastro-esophageal reflux disease without esophagitis: Secondary | ICD-10-CM | POA: Diagnosis not present

## 2021-04-07 DIAGNOSIS — D8989 Other specified disorders involving the immune mechanism, not elsewhere classified: Secondary | ICD-10-CM | POA: Diagnosis not present

## 2021-04-07 DIAGNOSIS — Z79899 Other long term (current) drug therapy: Secondary | ICD-10-CM | POA: Diagnosis not present

## 2021-04-07 DIAGNOSIS — Z7982 Long term (current) use of aspirin: Secondary | ICD-10-CM | POA: Diagnosis not present

## 2021-04-07 DIAGNOSIS — Z792 Long term (current) use of antibiotics: Secondary | ICD-10-CM | POA: Diagnosis not present

## 2021-04-07 DIAGNOSIS — Z79621 Long term (current) use of calcineurin inhibitor: Secondary | ICD-10-CM | POA: Diagnosis not present

## 2021-04-07 DIAGNOSIS — R112 Nausea with vomiting, unspecified: Secondary | ICD-10-CM | POA: Diagnosis not present

## 2021-04-07 DIAGNOSIS — I25119 Atherosclerotic heart disease of native coronary artery with unspecified angina pectoris: Secondary | ICD-10-CM | POA: Diagnosis not present

## 2021-04-07 DIAGNOSIS — R5381 Other malaise: Secondary | ICD-10-CM | POA: Diagnosis not present

## 2021-04-07 DIAGNOSIS — I25118 Atherosclerotic heart disease of native coronary artery with other forms of angina pectoris: Secondary | ICD-10-CM | POA: Diagnosis not present

## 2021-04-11 DIAGNOSIS — D849 Immunodeficiency, unspecified: Secondary | ICD-10-CM | POA: Diagnosis not present

## 2021-04-11 DIAGNOSIS — Z20822 Contact with and (suspected) exposure to covid-19: Secondary | ICD-10-CM | POA: Diagnosis not present

## 2021-04-11 DIAGNOSIS — R197 Diarrhea, unspecified: Secondary | ICD-10-CM | POA: Diagnosis not present

## 2021-04-12 DIAGNOSIS — I10 Essential (primary) hypertension: Secondary | ICD-10-CM | POA: Diagnosis not present

## 2021-04-12 DIAGNOSIS — Z7952 Long term (current) use of systemic steroids: Secondary | ICD-10-CM | POA: Diagnosis not present

## 2021-04-12 DIAGNOSIS — K219 Gastro-esophageal reflux disease without esophagitis: Secondary | ICD-10-CM | POA: Diagnosis not present

## 2021-04-12 DIAGNOSIS — I2510 Atherosclerotic heart disease of native coronary artery without angina pectoris: Secondary | ICD-10-CM | POA: Diagnosis not present

## 2021-04-12 DIAGNOSIS — Z4822 Encounter for aftercare following kidney transplant: Secondary | ICD-10-CM | POA: Diagnosis not present

## 2021-04-12 DIAGNOSIS — Z7409 Other reduced mobility: Secondary | ICD-10-CM | POA: Diagnosis not present

## 2021-04-12 DIAGNOSIS — E785 Hyperlipidemia, unspecified: Secondary | ICD-10-CM | POA: Diagnosis not present

## 2021-04-12 DIAGNOSIS — Z792 Long term (current) use of antibiotics: Secondary | ICD-10-CM | POA: Diagnosis not present

## 2021-04-12 DIAGNOSIS — R110 Nausea: Secondary | ICD-10-CM | POA: Diagnosis not present

## 2021-04-12 DIAGNOSIS — Z940 Kidney transplant status: Secondary | ICD-10-CM | POA: Diagnosis not present

## 2021-04-12 DIAGNOSIS — R197 Diarrhea, unspecified: Secondary | ICD-10-CM | POA: Diagnosis not present

## 2021-04-12 DIAGNOSIS — Z87898 Personal history of other specified conditions: Secondary | ICD-10-CM | POA: Diagnosis not present

## 2021-04-12 DIAGNOSIS — Z79899 Other long term (current) drug therapy: Secondary | ICD-10-CM | POA: Diagnosis not present

## 2021-04-12 DIAGNOSIS — D849 Immunodeficiency, unspecified: Secondary | ICD-10-CM | POA: Diagnosis not present

## 2021-04-12 DIAGNOSIS — M329 Systemic lupus erythematosus, unspecified: Secondary | ICD-10-CM | POA: Diagnosis not present

## 2021-04-12 DIAGNOSIS — D649 Anemia, unspecified: Secondary | ICD-10-CM | POA: Diagnosis not present

## 2021-04-12 DIAGNOSIS — R638 Other symptoms and signs concerning food and fluid intake: Secondary | ICD-10-CM | POA: Diagnosis not present

## 2021-04-12 DIAGNOSIS — Z95828 Presence of other vascular implants and grafts: Secondary | ICD-10-CM | POA: Diagnosis not present

## 2021-04-12 DIAGNOSIS — Z5181 Encounter for therapeutic drug level monitoring: Secondary | ICD-10-CM | POA: Diagnosis not present

## 2021-04-13 DIAGNOSIS — Z4822 Encounter for aftercare following kidney transplant: Secondary | ICD-10-CM | POA: Diagnosis not present

## 2021-04-15 DIAGNOSIS — D849 Immunodeficiency, unspecified: Secondary | ICD-10-CM | POA: Diagnosis not present

## 2021-04-15 DIAGNOSIS — Z940 Kidney transplant status: Secondary | ICD-10-CM | POA: Diagnosis not present

## 2021-04-21 DIAGNOSIS — D649 Anemia, unspecified: Secondary | ICD-10-CM | POA: Diagnosis not present

## 2021-04-21 DIAGNOSIS — D849 Immunodeficiency, unspecified: Secondary | ICD-10-CM | POA: Diagnosis not present

## 2021-04-21 DIAGNOSIS — Z940 Kidney transplant status: Secondary | ICD-10-CM | POA: Diagnosis not present

## 2021-04-21 DIAGNOSIS — N2889 Other specified disorders of kidney and ureter: Secondary | ICD-10-CM | POA: Diagnosis not present

## 2021-04-26 DIAGNOSIS — R112 Nausea with vomiting, unspecified: Secondary | ICD-10-CM | POA: Diagnosis not present

## 2021-04-26 DIAGNOSIS — Z940 Kidney transplant status: Secondary | ICD-10-CM | POA: Diagnosis not present

## 2021-04-26 DIAGNOSIS — K209 Esophagitis, unspecified without bleeding: Secondary | ICD-10-CM | POA: Diagnosis not present

## 2021-04-26 DIAGNOSIS — R198 Other specified symptoms and signs involving the digestive system and abdomen: Secondary | ICD-10-CM | POA: Diagnosis not present

## 2021-04-27 DIAGNOSIS — Z452 Encounter for adjustment and management of vascular access device: Secondary | ICD-10-CM | POA: Diagnosis not present

## 2021-04-27 DIAGNOSIS — M3214 Glomerular disease in systemic lupus erythematosus: Secondary | ICD-10-CM | POA: Diagnosis not present

## 2021-04-27 DIAGNOSIS — Z7952 Long term (current) use of systemic steroids: Secondary | ICD-10-CM | POA: Diagnosis not present

## 2021-04-27 DIAGNOSIS — D649 Anemia, unspecified: Secondary | ICD-10-CM | POA: Diagnosis not present

## 2021-04-27 DIAGNOSIS — Z79621 Long term (current) use of calcineurin inhibitor: Secondary | ICD-10-CM | POA: Diagnosis not present

## 2021-04-27 DIAGNOSIS — I25119 Atherosclerotic heart disease of native coronary artery with unspecified angina pectoris: Secondary | ICD-10-CM | POA: Diagnosis not present

## 2021-04-27 DIAGNOSIS — Z792 Long term (current) use of antibiotics: Secondary | ICD-10-CM | POA: Diagnosis not present

## 2021-04-27 DIAGNOSIS — I10 Essential (primary) hypertension: Secondary | ICD-10-CM | POA: Diagnosis not present

## 2021-04-27 DIAGNOSIS — Z940 Kidney transplant status: Secondary | ICD-10-CM | POA: Diagnosis not present

## 2021-04-27 DIAGNOSIS — Z79899 Other long term (current) drug therapy: Secondary | ICD-10-CM | POA: Diagnosis not present

## 2021-04-27 DIAGNOSIS — D84821 Immunodeficiency due to drugs: Secondary | ICD-10-CM | POA: Diagnosis not present

## 2021-04-27 DIAGNOSIS — K219 Gastro-esophageal reflux disease without esophagitis: Secondary | ICD-10-CM | POA: Diagnosis not present

## 2021-04-27 DIAGNOSIS — D849 Immunodeficiency, unspecified: Secondary | ICD-10-CM | POA: Diagnosis not present

## 2021-04-27 DIAGNOSIS — Z4822 Encounter for aftercare following kidney transplant: Secondary | ICD-10-CM | POA: Diagnosis not present

## 2021-04-27 DIAGNOSIS — R112 Nausea with vomiting, unspecified: Secondary | ICD-10-CM | POA: Diagnosis not present

## 2021-04-27 DIAGNOSIS — E785 Hyperlipidemia, unspecified: Secondary | ICD-10-CM | POA: Diagnosis not present

## 2021-04-28 DIAGNOSIS — Z7952 Long term (current) use of systemic steroids: Secondary | ICD-10-CM | POA: Diagnosis not present

## 2021-04-28 DIAGNOSIS — M3214 Glomerular disease in systemic lupus erythematosus: Secondary | ICD-10-CM | POA: Diagnosis not present

## 2021-04-28 DIAGNOSIS — Z79621 Long term (current) use of calcineurin inhibitor: Secondary | ICD-10-CM | POA: Diagnosis not present

## 2021-04-28 DIAGNOSIS — Z79624 Long term (current) use of inhibitors of nucleotide synthesis: Secondary | ICD-10-CM | POA: Diagnosis not present

## 2021-04-28 DIAGNOSIS — D649 Anemia, unspecified: Secondary | ICD-10-CM | POA: Diagnosis not present

## 2021-04-28 DIAGNOSIS — Z79899 Other long term (current) drug therapy: Secondary | ICD-10-CM | POA: Diagnosis not present

## 2021-04-28 DIAGNOSIS — M109 Gout, unspecified: Secondary | ICD-10-CM | POA: Diagnosis not present

## 2021-04-28 DIAGNOSIS — Z940 Kidney transplant status: Secondary | ICD-10-CM | POA: Diagnosis not present

## 2021-05-03 DIAGNOSIS — Z713 Dietary counseling and surveillance: Secondary | ICD-10-CM | POA: Diagnosis not present

## 2021-05-05 DIAGNOSIS — Z4822 Encounter for aftercare following kidney transplant: Secondary | ICD-10-CM | POA: Diagnosis not present

## 2021-05-05 DIAGNOSIS — D8989 Other specified disorders involving the immune mechanism, not elsewhere classified: Secondary | ICD-10-CM | POA: Diagnosis not present

## 2021-05-05 DIAGNOSIS — E785 Hyperlipidemia, unspecified: Secondary | ICD-10-CM | POA: Diagnosis not present

## 2021-05-05 DIAGNOSIS — I25119 Atherosclerotic heart disease of native coronary artery with unspecified angina pectoris: Secondary | ICD-10-CM | POA: Diagnosis not present

## 2021-05-05 DIAGNOSIS — Z79899 Other long term (current) drug therapy: Secondary | ICD-10-CM | POA: Diagnosis not present

## 2021-05-05 DIAGNOSIS — D72819 Decreased white blood cell count, unspecified: Secondary | ICD-10-CM | POA: Diagnosis not present

## 2021-05-05 DIAGNOSIS — K219 Gastro-esophageal reflux disease without esophagitis: Secondary | ICD-10-CM | POA: Diagnosis not present

## 2021-05-05 DIAGNOSIS — M3214 Glomerular disease in systemic lupus erythematosus: Secondary | ICD-10-CM | POA: Diagnosis not present

## 2021-05-05 DIAGNOSIS — R112 Nausea with vomiting, unspecified: Secondary | ICD-10-CM | POA: Diagnosis not present

## 2021-05-05 DIAGNOSIS — Z7952 Long term (current) use of systemic steroids: Secondary | ICD-10-CM | POA: Diagnosis not present

## 2021-05-05 DIAGNOSIS — I120 Hypertensive chronic kidney disease with stage 5 chronic kidney disease or end stage renal disease: Secondary | ICD-10-CM | POA: Diagnosis not present

## 2021-05-05 DIAGNOSIS — M329 Systemic lupus erythematosus, unspecified: Secondary | ICD-10-CM | POA: Diagnosis not present

## 2021-05-05 DIAGNOSIS — I10 Essential (primary) hypertension: Secondary | ICD-10-CM | POA: Diagnosis not present

## 2021-05-05 DIAGNOSIS — Z792 Long term (current) use of antibiotics: Secondary | ICD-10-CM | POA: Diagnosis not present

## 2021-05-05 DIAGNOSIS — Z940 Kidney transplant status: Secondary | ICD-10-CM | POA: Diagnosis not present

## 2021-05-05 DIAGNOSIS — I2510 Atherosclerotic heart disease of native coronary artery without angina pectoris: Secondary | ICD-10-CM | POA: Diagnosis not present

## 2021-05-05 DIAGNOSIS — Z955 Presence of coronary angioplasty implant and graft: Secondary | ICD-10-CM | POA: Diagnosis not present

## 2021-05-19 DIAGNOSIS — I2510 Atherosclerotic heart disease of native coronary artery without angina pectoris: Secondary | ICD-10-CM | POA: Diagnosis not present

## 2021-05-19 DIAGNOSIS — Z940 Kidney transplant status: Secondary | ICD-10-CM | POA: Diagnosis not present

## 2021-05-19 DIAGNOSIS — M329 Systemic lupus erythematosus, unspecified: Secondary | ICD-10-CM | POA: Diagnosis not present

## 2021-05-19 DIAGNOSIS — Z7982 Long term (current) use of aspirin: Secondary | ICD-10-CM | POA: Diagnosis not present

## 2021-05-19 DIAGNOSIS — I10 Essential (primary) hypertension: Secondary | ICD-10-CM | POA: Diagnosis not present

## 2021-05-19 DIAGNOSIS — E785 Hyperlipidemia, unspecified: Secondary | ICD-10-CM | POA: Diagnosis not present

## 2021-05-19 DIAGNOSIS — R112 Nausea with vomiting, unspecified: Secondary | ICD-10-CM | POA: Diagnosis not present

## 2021-05-19 DIAGNOSIS — D8989 Other specified disorders involving the immune mechanism, not elsewhere classified: Secondary | ICD-10-CM | POA: Diagnosis not present

## 2021-05-19 DIAGNOSIS — D72819 Decreased white blood cell count, unspecified: Secondary | ICD-10-CM | POA: Diagnosis not present

## 2021-05-19 DIAGNOSIS — E119 Type 2 diabetes mellitus without complications: Secondary | ICD-10-CM | POA: Diagnosis not present

## 2021-05-19 DIAGNOSIS — K219 Gastro-esophageal reflux disease without esophagitis: Secondary | ICD-10-CM | POA: Diagnosis not present

## 2021-05-19 DIAGNOSIS — Z79621 Long term (current) use of calcineurin inhibitor: Secondary | ICD-10-CM | POA: Diagnosis not present

## 2021-05-19 DIAGNOSIS — Z7952 Long term (current) use of systemic steroids: Secondary | ICD-10-CM | POA: Diagnosis not present

## 2021-05-19 DIAGNOSIS — Z792 Long term (current) use of antibiotics: Secondary | ICD-10-CM | POA: Diagnosis not present

## 2021-05-19 DIAGNOSIS — Z4822 Encounter for aftercare following kidney transplant: Secondary | ICD-10-CM | POA: Diagnosis not present

## 2021-05-19 DIAGNOSIS — Z79899 Other long term (current) drug therapy: Secondary | ICD-10-CM | POA: Diagnosis not present

## 2021-05-22 NOTE — Progress Notes (Signed)
Cardiology Clinic Note   Patient Name: Lindsey York Date of Encounter: 05/24/2021  Primary Care Provider:  Patient, No Pcp Per (Inactive) Primary Cardiologist:  Quay Burow, MD  Patient Profile    Lindsey York 55 year old female presents to the clinic today for follow-up evaluation of her coronary artery disease and hypertension.  Past Medical History    Past Medical History:  Diagnosis Date   Anemia    CKD (chronic kidney disease), stage V ([place])    "followed at [facility]" (08/28/2017)   Coronary artery disease    Diarrhea 09/2017   GERD (gastroesophageal reflux disease)    High cholesterol    History of gout    "on daily RX"" (08/28/2017)   Hypertension    NSTEMI (non-ST elevated myocardial infarction) ([place]) 08/26/2017   Systemic lupus ([place])    Past Surgical History:  Procedure Laterality Date   BREAST SURGERY Left ~ 1978   "tumors taken out"   CORONARY ANGIOGRAPHY N/A 12/31/2017   Procedure: CORONARY ANGIOGRAPHY;  Surgeon: Lorretta Harp, MD;  Location: [place] [facility];  Service: Cardiovascular;  Laterality: N/A;   CORONARY STENT INTERVENTION N/A 08/28/2017   Procedure: CORONARY STENT INTERVENTION;  Surgeon: Burnell Blanks, MD;  Location: [place] [facility];  Service: Cardiovascular;  Laterality: N/A;   CORONARY STENT INTERVENTION  12/31/2017   CORONARY STENT INTERVENTION N/A 12/31/2017   Procedure: CORONARY STENT INTERVENTION;  Surgeon: Lorretta Harp, MD;  Location: [place] [facility];  Service: Cardiovascular;  Laterality: N/A;   HERNIA REPAIR     LAPAROSCOPIC CHOLECYSTECTOMY     LEFT HEART CATH AND CORONARY ANGIOGRAPHY N/A 08/28/2017   Procedure: LEFT HEART CATH AND CORONARY ANGIOGRAPHY;  Surgeon: Burnell Blanks, MD;  Location: [place] [facility];  Service: Cardiovascular;  Laterality: N/A;   MOUTH SURGERY     oral cavity biopsy   TUBAL LIGATION     UMBILICAL HERNIA REPAIR      Allergies  Allergies  Allergen Reactions    Hydroxychloroquine Hives and Rash   Codeine     History of Present Illness    Lindsey York has a PMH of renal transplant 01/31/2021,  lupus, HTN, HLD, gout, GERD, CKD stage V, anemia NSTEMI 08/26/2017.  Underwent cardiac catheterization by Dr. Angelena Form via right femoral approach which showed a subtotal occluded first marginal branch which received DES x1.  She also was noted to have 80% mid RCA stenosis which was not intervened on due to her renal insufficiency.  She underwent subsequent PCI and received DES x1 to her RCA 01/01/2028.  She is on the renal transplant list at [facility].   She was  seen by Dr. Gwenlyn Found on 12/05/2019.  During that time she had no cardiac complaints.  She was started on hemodialysis 3/21 and was doing well.  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  Due to the time since her PCI her Brilinta was also safely stopped and she was placed back on 81 mg aspirin.  She denied chest pain and shortness of breath.   She contacted nurse triage line on 01/21/2020 and indicated that she was having periods of low blood pressure.  This was making her hemodialysis challenging.   She was seen virtually 01/2820 in follow-up and stated she felt well.  She had been having trouble with her blood pressure being too low prior to hemodialysis.  The nephrologist  discontinued her amlodipine and hydralazine.  She had  tried to continue to take her clonidine and her carvedilol however her blood pressures  remained fairly low and they were not able to pull the amount of fluid that was needed during her HD treatments.  She reported compliance with her aspirin and had not had further bleeding issues from her fistula.  I  discontinued her clonidine and changed her carvedilol to 3.125 twice daily.  She continued to monitor her diet closely and stayed physically active.  She remained on the [facility] renal transplant list.  I  gave her the salty 6  diet sheet, had her increase her physical activity as tolerated, keep a blood pressure log, and planned her follow-up for 1 month.   She was seen virtually 02/23/2020 in follow-up and stated she was  receiving  a HD session.  She stated that she had needed to adjust her carvedilol due to lower blood pressures.  Occasionally she was taking it only once daily.  She also stated that her furosemide was only producing small amounts of urine at this time.  She was limited in her physical activity due to right foot issues/orthopedic issues.  She was planning on having a steroid injection which she  had good success with in the past.  She had received her COVID-19 booster and continued to socially distance, wearing her mask in public.  She was still hopeful for renal transplant.  She had been maintaining a strict diet, and monitoring her weight.  We  discontinued her furosemide, repeated a lipid panel, and planned follow-up in 6 months.  She was seen in follow-up by Dr. Gwenlyn Found on 11/09/2020.  During that time she was stable from a cardiac standpoint.  She was tolerating her antihypertensive medication well.  She denied chest pain or shortness of breath.  Her EKG showed normal sinus rhythm 71 bpm with nonspecific ST and T wave changes.  Underwent renal transplant 01/31/2021.  Her postoperative course was complicated by tachycardia and labile blood pressures.  She was noted to have EKG changes concerning for STEMI and cardiology was consulted.  She was noted to have a slight increase in her troponins but, serial troponins were negative.  Her echocardiogram showed preserved LVEF and no wall motion abnormalities.  She was noted to have right arm swelling on 11 1.  Right upper extremity duplex showed acute occlusive DVT of her paired radial veins and one of the paired ulnar veins at the wrist.  Vascular surgery was consulted and recommended compression and elevation of her right upper extremity.  Her swelling improved and no  surgical intervention was indicated.  Hematology was consulted and recommended anticoagulation with argatroban.  She continued to require intermittent hemodialysis.  Her renal function slowly improved.  She was continued on immunosuppression.   She presents to the clinic today for follow-up evaluation states she has had some difficulty with her stomach since her transplant.  She has been transitioned off of CellCept and is doing much better.  She continues to follow with [facility] and has been having follow-up appointments every other week with exams and lab work.  She is looking forward to increasing her physical activity as her stomach returns to normal.  We reviewed her blood pressure today and at home it is ranging in the 756-433 systolic.  She has not noticed blood pressure fluctuations since being off of hemodialysis.  She did have 1 treatment after her transplant but has not needed further HD treatments.  We will continue her current  medication regimen, have her increase her physical activity as tolerated, and plan follow-up for 3 to 4 months.  Today she denies chest pain, shortness of breath, lower extremity edema, increased fatigue, palpitations, melena, hematuria, hemoptysis, diaphoresis, weakness, presyncope, syncope, orthopnea, and PND.   Home Medications    Prior to Admission medications   Medication Sig Start Date End Date Taking? Authorizing Provider  acetaminophen (TYLENOL) 325 MG tablet Take 650 mg by mouth every 6 (six) hours as needed for moderate pain.    [provider]  acyclovir ointment (ZOVIRAX) 5 % Apply twice a day to affected area as needed 03/26/19   [provider]  aspirin EC 81 MG tablet Take 1 tablet (81 mg total) by mouth daily. Swallow whole. 12/05/19   Lorretta Harp, MD  B COMPLEX-C-FOLIC ACID ER PO Take by mouth.    [provider]  bevacizumab (AVASTIN) 400 MG/16ML SOLN Inject 1.25 mg as directed every 30 (thirty) days. Pt  takes as needed. 03/22/17   [provider]  calcium carbonate (OS-CAL) 1250 (500 Ca) MG chewable tablet Chew by mouth. 03/21/19   [provider]  carvedilol (COREG) 3.125 MG tablet Take 1 tablet (3.125 mg total) by mouth 2 (two) times daily with a meal. 02/02/20   Deberah Pelton, NP  conjugated estrogens (PREMARIN) vaginal cream  11/03/19   [provider]  ezetimibe (ZETIA) 10 MG tablet TAKE 1 TABLET(10 MG) BY MOUTH DAILY 12/13/20   Lorretta Harp, MD  nitroGLYCERIN (NITROSTAT) 0.4 MG SL tablet Place 1 tablet (0.4 mg total) under the tongue every 5 (five) minutes as needed for chest pain. 11/09/20 02/07/21  Lorretta Harp, MD  predniSONE (DELTASONE) 1 MG tablet Take 4 mg by mouth daily with breakfast. Pt takes 4mg  the every day    [provider]  sevelamer carbonate (RENVELA) 800 MG tablet Take 800 mg by mouth in the morning, at noon, in the evening, and at bedtime. 11/15/17   [provider]  simvastatin (ZOCOR) 20 MG tablet TAKE 1 TABLET(20 MG) BY MOUTH AT BEDTIME 10/18/20   Lorretta Harp, MD  valACYclovir (VALTREX) 500 MG tablet Take by mouth. Pt takes as needed. 06/26/19   [provider]    Family History    History reviewed. No pertinent family history. has no family status information on file.   Social History    Social History   Socioeconomic History   Marital status: Divorced    Spouse name: Not on file   Number of children: Not on file   Years of education: Not on file   Highest education level: Not on file  Occupational History   Not on file  Tobacco Use   Smoking status: Never   Smokeless tobacco: Never  Vaping Use   Vaping Use: Never used  Substance and Sexual Activity   Alcohol use: Never   Drug use: Never   Sexual activity: Yes  Other Topics Concern   Not on file  Social History Narrative   Not on file   Social Determinants of Health   Financial Resource Strain: Not on file  Food Insecurity: Not  on file  Transportation Needs: Not on file  Physical Activity: Not on file  Stress: Not on file  Social Connections: Not on file  Intimate Partner Violence: Not on file     Review of Systems    General:  No chills, fever, night sweats or weight changes.  Cardiovascular:  No chest pain,  dyspnea on exertion, edema, orthopnea, palpitations, paroxysmal nocturnal dyspnea. Dermatological: No rash, lesions/masses Respiratory: No cough, dyspnea Urologic: No hematuria, dysuria Abdominal:   No nausea, vomiting, diarrhea, bright red blood per rectum, melena, or hematemesis Neurologic:  No visual changes, wkns, changes in mental status. All other systems reviewed and are otherwise negative except as noted above.  Physical Exam    VS:  BP 136/88    Pulse 68    Ht 5\' 3"  (1.6 m)    Wt 155 lb 12.8 oz (70.7 kg)    SpO2 98%    BMI 27.60 kg/m  , BMI Body mass index is 27.6 kg/m. GEN: Well nourished, well developed, in no acute distress. HEENT: normal. Neck: Supple, no JVD, carotid bruits, or masses. Cardiac: RRR, no murmurs, rubs, or gallops. No clubbing, cyanosis, edema.  Radials/DP/PT 2+ and equal bilaterally.  Respiratory:  Respirations regular and unlabored, clear to auscultation bilaterally. GI: Soft, nontender, nondistended, BS + x 4. MS: no deformity or atrophy. Skin: warm and dry, no rash. Neuro:  Strength and sensation are intact. Psych: Normal affect.  Accessory Clinical Findings    Recent Labs: No results found for requested labs within last 8760 hours.   Recent Lipid Panel    Component Value Date/Time   CHOL 141 11/06/2018 0910   TRIG 72 11/06/2018 0910   HDL 61 11/06/2018 0910   CHOLHDL 2.3 11/06/2018 0910   CHOLHDL 3.8 08/30/2017 0227   VLDL 22 08/30/2017 0227   LDLCALC 66 11/06/2018 0910    ECG personally reviewed by me today-normal sinus rhythm 68 bpm no acute changes.  Echocardiogram 08/29/2017  Study Conclusions   - Left ventricle: The cavity size was normal.  Wall thickness was    increased in a pattern of moderate LVH. Systolic function was    normal. The estimated ejection fraction was in the range of 60%    to 65%. Wall motion was normal; there were no regional wall    motion abnormalities. Doppler parameters are consistent with    abnormal left ventricular relaxation (grade 1 diastolic    dysfunction). The E/e&' ratio is between 8-15, suggesting    indeterminate LV filling pressure.  - Aortic valve: Trileaflet. There was no stenosis. There was    trivial regurgitation.  - Mitral valve: Mildly thickened leaflets . There was trivial    regurgitation.  - Left atrium: The atrium was normal in size.  - Tricuspid valve: There was trivial regurgitation.  - Pulmonary arteries: PA peak pressure: 21 mm Hg (S).  - Inferior vena cava: The vessel was normal in size. The    respirophasic diameter changes were in the normal range (>= 50%),    consistent with normal central venous pressure.    Cardiac catheterization 12/31/2017 Mid RCA lesion is 80% stenosed. Previously placed Ost 2nd Mrg to 2nd Mrg stent (unknown type) is widely patent. A drug-eluting stent was successfully placed. Post intervention, there is a 0% residual stenosis.   Lindsey York is a 56 y.o. female Diagnostic Dominance: Right Intervention      Assessment & Plan   1.  Coronary artery disease-recovering well from renal transplant.  Underwent cardiac catheterization on 08/26/2017 and received PCI with DES x1 to her first marginal branch.  Due to her renal function her RCA was not intervened upon at that time.  She underwent cardiac catheterization 12/31/2017 and received PCI to her RCA.  Was felt to have EKG changes concerning for STEMI after renal transplant.  Had slight  increase in troponins but serial troponins were negative.  Echocardiogram showed preserved LVEF and no wall motion abnormalities.  Maintain physical activity continue aspirin, carvedilol, clonidine, furosemide,  Zetia, simvastatin Heart healthy low-sodium diet Increase physical activity as tolerated  Essential hypertension-BP today 136/88.    Has been well controlled at home in the 235-573 systolic range.   Continue carvedilol Heart healthy low-sodium diet-salty 6 given Increase physical activity as tolerated   HLD-LDL 46 on 12/22 Continue Zetia, simvastatin Heart healthy low-sodium high-fiber diet Increase physical activity as tolerated Follows with PCP   History of renal transplant-underwent transplant procedure 01/31/2021.  Details above.  Has been doing well post transplant. Follows with [facility] transplant center  Disposition: Follow-up with Dr. Gwenlyn Found in 3-4 months.   Jossie Ng. Bailea Beed NP-C    05/24/2021, 9:57 AM [address] Office [phone] Fax [phone]  Notice: This dictation was prepared with Dragon dictation along with smaller phrase technology. Any transcriptional errors that result from this process are unintentional and may not be corrected upon review.  I spent 14 minutes examining this patient, reviewing medications, and using patient centered shared decision making involving her cardiac care.  Prior to her visit I spent greater than 20 minutes reviewing her past medical history,  medications, and prior cardiac tests.

## 2021-05-24 ENCOUNTER — Ambulatory Visit: Payer: Medicare Other | Admitting: General Practice

## 2021-05-24 ENCOUNTER — Other Ambulatory Visit: Payer: Self-pay

## 2021-05-24 ENCOUNTER — Encounter: Payer: Self-pay | Admitting: General Practice

## 2021-05-24 VITALS — BP 136/88 | HR 68 | Ht 63.0 in | Wt 155.8 lb

## 2021-05-24 DIAGNOSIS — I10 Essential (primary) hypertension: Secondary | ICD-10-CM | POA: Diagnosis not present

## 2021-05-24 DIAGNOSIS — Z940 Kidney transplant status: Secondary | ICD-10-CM

## 2021-05-24 DIAGNOSIS — I2510 Atherosclerotic heart disease of native coronary artery without angina pectoris: Secondary | ICD-10-CM

## 2021-05-24 DIAGNOSIS — E785 Hyperlipidemia, unspecified: Secondary | ICD-10-CM

## 2021-05-24 NOTE — Patient Instructions (Signed)
Medication Instructions:  The current medical regimen is effective;  continue present plan and medications as directed. Please refer to the Current Medication list given to you today.   *If you need a refill on your cardiac medications before your next appointment, please call your pharmacy*  Lab Work:   Testing/Procedures:  none    none  Follow-Up: Your next appointment:  3-4 month(s) In Person with Quay Burow, MD    At [facility], you and your health needs are our priority.  As part of our continuing mission to provide you with exceptional heart care, we have created designated Provider Care Teams.  These Care Teams include your primary Cardiologist (physician) and Advanced Practice Providers (APPs -  Physician Assistants and Nurse Practitioners) who all work together to provide you with the care you need, when you need it.

## 2021-05-26 DIAGNOSIS — D849 Immunodeficiency, unspecified: Secondary | ICD-10-CM | POA: Diagnosis not present

## 2021-05-26 DIAGNOSIS — Z940 Kidney transplant status: Secondary | ICD-10-CM | POA: Diagnosis not present

## 2021-05-26 DIAGNOSIS — I10 Essential (primary) hypertension: Secondary | ICD-10-CM | POA: Diagnosis not present

## 2021-06-02 DIAGNOSIS — D849 Immunodeficiency, unspecified: Secondary | ICD-10-CM | POA: Diagnosis not present

## 2021-06-02 DIAGNOSIS — E785 Hyperlipidemia, unspecified: Secondary | ICD-10-CM | POA: Diagnosis not present

## 2021-06-02 DIAGNOSIS — D649 Anemia, unspecified: Secondary | ICD-10-CM | POA: Diagnosis not present

## 2021-06-02 DIAGNOSIS — Z940 Kidney transplant status: Secondary | ICD-10-CM | POA: Diagnosis not present

## 2021-06-02 DIAGNOSIS — Z79899 Other long term (current) drug therapy: Secondary | ICD-10-CM | POA: Diagnosis not present

## 2021-06-02 DIAGNOSIS — Z7952 Long term (current) use of systemic steroids: Secondary | ICD-10-CM | POA: Diagnosis not present

## 2021-06-02 DIAGNOSIS — K219 Gastro-esophageal reflux disease without esophagitis: Secondary | ICD-10-CM | POA: Diagnosis not present

## 2021-06-02 DIAGNOSIS — D708 Other neutropenia: Secondary | ICD-10-CM | POA: Diagnosis not present

## 2021-06-02 DIAGNOSIS — Z7982 Long term (current) use of aspirin: Secondary | ICD-10-CM | POA: Diagnosis not present

## 2021-06-02 DIAGNOSIS — Z4822 Encounter for aftercare following kidney transplant: Secondary | ICD-10-CM | POA: Diagnosis not present

## 2021-06-02 DIAGNOSIS — D72819 Decreased white blood cell count, unspecified: Secondary | ICD-10-CM | POA: Diagnosis not present

## 2021-06-02 DIAGNOSIS — Z79621 Long term (current) use of calcineurin inhibitor: Secondary | ICD-10-CM | POA: Diagnosis not present

## 2021-06-02 DIAGNOSIS — I2510 Atherosclerotic heart disease of native coronary artery without angina pectoris: Secondary | ICD-10-CM | POA: Diagnosis not present

## 2021-06-02 DIAGNOSIS — I10 Essential (primary) hypertension: Secondary | ICD-10-CM | POA: Diagnosis not present

## 2021-06-02 DIAGNOSIS — B348 Other viral infections of unspecified site: Secondary | ICD-10-CM | POA: Diagnosis not present

## 2021-06-15 DIAGNOSIS — I2510 Atherosclerotic heart disease of native coronary artery without angina pectoris: Secondary | ICD-10-CM | POA: Diagnosis not present

## 2021-06-15 DIAGNOSIS — R112 Nausea with vomiting, unspecified: Secondary | ICD-10-CM | POA: Diagnosis not present

## 2021-06-15 DIAGNOSIS — Z888 Allergy status to other drugs, medicaments and biological substances status: Secondary | ICD-10-CM | POA: Diagnosis not present

## 2021-06-15 DIAGNOSIS — D8989 Other specified disorders involving the immune mechanism, not elsewhere classified: Secondary | ICD-10-CM | POA: Diagnosis not present

## 2021-06-15 DIAGNOSIS — M329 Systemic lupus erythematosus, unspecified: Secondary | ICD-10-CM | POA: Diagnosis not present

## 2021-06-15 DIAGNOSIS — B9789 Other viral agents as the cause of diseases classified elsewhere: Secondary | ICD-10-CM | POA: Diagnosis not present

## 2021-06-15 DIAGNOSIS — Z8739 Personal history of other diseases of the musculoskeletal system and connective tissue: Secondary | ICD-10-CM | POA: Diagnosis not present

## 2021-06-15 DIAGNOSIS — Z940 Kidney transplant status: Secondary | ICD-10-CM | POA: Diagnosis not present

## 2021-06-15 DIAGNOSIS — Z7952 Long term (current) use of systemic steroids: Secondary | ICD-10-CM | POA: Diagnosis not present

## 2021-06-15 DIAGNOSIS — Z7960 Long term (current) use of unspecified immunomodulators and immunosuppressants: Secondary | ICD-10-CM | POA: Diagnosis not present

## 2021-06-15 DIAGNOSIS — Z885 Allergy status to narcotic agent status: Secondary | ICD-10-CM | POA: Diagnosis not present

## 2021-06-15 DIAGNOSIS — Z79899 Other long term (current) drug therapy: Secondary | ICD-10-CM | POA: Diagnosis not present

## 2021-06-15 DIAGNOSIS — Z955 Presence of coronary angioplasty implant and graft: Secondary | ICD-10-CM | POA: Diagnosis not present

## 2021-06-15 DIAGNOSIS — E785 Hyperlipidemia, unspecified: Secondary | ICD-10-CM | POA: Diagnosis not present

## 2021-06-15 DIAGNOSIS — I10 Essential (primary) hypertension: Secondary | ICD-10-CM | POA: Diagnosis not present

## 2021-06-15 DIAGNOSIS — D84821 Immunodeficiency due to drugs: Secondary | ICD-10-CM | POA: Diagnosis not present

## 2021-06-15 DIAGNOSIS — Z792 Long term (current) use of antibiotics: Secondary | ICD-10-CM | POA: Diagnosis not present

## 2021-06-15 DIAGNOSIS — Z4822 Encounter for aftercare following kidney transplant: Secondary | ICD-10-CM | POA: Diagnosis not present

## 2021-06-23 DIAGNOSIS — Z4822 Encounter for aftercare following kidney transplant: Secondary | ICD-10-CM | POA: Diagnosis not present

## 2021-06-30 DIAGNOSIS — R82998 Other abnormal findings in urine: Secondary | ICD-10-CM | POA: Diagnosis not present

## 2021-06-30 DIAGNOSIS — Z940 Kidney transplant status: Secondary | ICD-10-CM | POA: Diagnosis not present

## 2021-06-30 DIAGNOSIS — H35363 Drusen (degenerative) of macula, bilateral: Secondary | ICD-10-CM | POA: Diagnosis not present

## 2021-06-30 DIAGNOSIS — H35712 Central serous chorioretinopathy, left eye: Secondary | ICD-10-CM | POA: Diagnosis not present

## 2021-06-30 DIAGNOSIS — H2513 Age-related nuclear cataract, bilateral: Secondary | ICD-10-CM | POA: Diagnosis not present

## 2021-06-30 DIAGNOSIS — H57811 Brow ptosis, right: Secondary | ICD-10-CM | POA: Diagnosis not present

## 2021-06-30 DIAGNOSIS — D849 Immunodeficiency, unspecified: Secondary | ICD-10-CM | POA: Diagnosis not present

## 2021-06-30 DIAGNOSIS — Z885 Allergy status to narcotic agent status: Secondary | ICD-10-CM | POA: Diagnosis not present

## 2021-06-30 DIAGNOSIS — H35013 Changes in retinal vascular appearance, bilateral: Secondary | ICD-10-CM | POA: Diagnosis not present

## 2021-06-30 DIAGNOSIS — H353222 Exudative age-related macular degeneration, left eye, with inactive choroidal neovascularization: Secondary | ICD-10-CM | POA: Diagnosis not present

## 2021-07-01 ENCOUNTER — Other Ambulatory Visit: Payer: Self-pay | Admitting: General Practice

## 2021-07-06 DIAGNOSIS — B349 Viral infection, unspecified: Secondary | ICD-10-CM | POA: Diagnosis not present

## 2021-07-06 DIAGNOSIS — Z4822 Encounter for aftercare following kidney transplant: Secondary | ICD-10-CM | POA: Diagnosis not present

## 2021-07-06 DIAGNOSIS — Z7952 Long term (current) use of systemic steroids: Secondary | ICD-10-CM | POA: Diagnosis not present

## 2021-07-06 DIAGNOSIS — D72819 Decreased white blood cell count, unspecified: Secondary | ICD-10-CM | POA: Diagnosis not present

## 2021-07-06 DIAGNOSIS — B348 Other viral infections of unspecified site: Secondary | ICD-10-CM | POA: Diagnosis not present

## 2021-07-06 DIAGNOSIS — Z7960 Long term (current) use of unspecified immunomodulators and immunosuppressants: Secondary | ICD-10-CM | POA: Diagnosis not present

## 2021-07-06 DIAGNOSIS — R638 Other symptoms and signs concerning food and fluid intake: Secondary | ICD-10-CM | POA: Diagnosis not present

## 2021-07-06 DIAGNOSIS — Z940 Kidney transplant status: Secondary | ICD-10-CM | POA: Diagnosis not present

## 2021-07-06 DIAGNOSIS — Z5181 Encounter for therapeutic drug level monitoring: Secondary | ICD-10-CM | POA: Diagnosis not present

## 2021-07-06 DIAGNOSIS — Z792 Long term (current) use of antibiotics: Secondary | ICD-10-CM | POA: Diagnosis not present

## 2021-07-12 DIAGNOSIS — N186 End stage renal disease: Secondary | ICD-10-CM | POA: Diagnosis not present

## 2021-07-12 DIAGNOSIS — R112 Nausea with vomiting, unspecified: Secondary | ICD-10-CM | POA: Diagnosis not present

## 2021-07-12 DIAGNOSIS — D708 Other neutropenia: Secondary | ICD-10-CM | POA: Diagnosis not present

## 2021-07-12 DIAGNOSIS — Z95828 Presence of other vascular implants and grafts: Secondary | ICD-10-CM | POA: Diagnosis not present

## 2021-07-12 DIAGNOSIS — E785 Hyperlipidemia, unspecified: Secondary | ICD-10-CM | POA: Diagnosis not present

## 2021-07-12 DIAGNOSIS — R350 Frequency of micturition: Secondary | ICD-10-CM | POA: Diagnosis not present

## 2021-07-12 DIAGNOSIS — Z955 Presence of coronary angioplasty implant and graft: Secondary | ICD-10-CM | POA: Diagnosis not present

## 2021-07-12 DIAGNOSIS — D849 Immunodeficiency, unspecified: Secondary | ICD-10-CM | POA: Diagnosis not present

## 2021-07-12 DIAGNOSIS — M329 Systemic lupus erythematosus, unspecified: Secondary | ICD-10-CM | POA: Diagnosis not present

## 2021-07-12 DIAGNOSIS — B259 Cytomegaloviral disease, unspecified: Secondary | ICD-10-CM | POA: Diagnosis not present

## 2021-07-12 DIAGNOSIS — R3915 Urgency of urination: Secondary | ICD-10-CM | POA: Diagnosis not present

## 2021-07-12 DIAGNOSIS — D631 Anemia in chronic kidney disease: Secondary | ICD-10-CM | POA: Diagnosis not present

## 2021-07-12 DIAGNOSIS — Z940 Kidney transplant status: Secondary | ICD-10-CM | POA: Diagnosis not present

## 2021-07-12 DIAGNOSIS — I2510 Atherosclerotic heart disease of native coronary artery without angina pectoris: Secondary | ICD-10-CM | POA: Diagnosis not present

## 2021-07-12 DIAGNOSIS — I10 Essential (primary) hypertension: Secondary | ICD-10-CM | POA: Diagnosis not present

## 2021-07-12 DIAGNOSIS — K219 Gastro-esophageal reflux disease without esophagitis: Secondary | ICD-10-CM | POA: Diagnosis not present

## 2021-07-12 DIAGNOSIS — Z79899 Other long term (current) drug therapy: Secondary | ICD-10-CM | POA: Diagnosis not present

## 2021-07-12 DIAGNOSIS — Z7952 Long term (current) use of systemic steroids: Secondary | ICD-10-CM | POA: Diagnosis not present

## 2021-07-12 DIAGNOSIS — Z992 Dependence on renal dialysis: Secondary | ICD-10-CM | POA: Diagnosis not present

## 2021-07-12 DIAGNOSIS — I120 Hypertensive chronic kidney disease with stage 5 chronic kidney disease or end stage renal disease: Secondary | ICD-10-CM | POA: Diagnosis not present

## 2021-07-12 DIAGNOSIS — D72819 Decreased white blood cell count, unspecified: Secondary | ICD-10-CM | POA: Diagnosis not present

## 2021-07-12 DIAGNOSIS — B348 Other viral infections of unspecified site: Secondary | ICD-10-CM | POA: Diagnosis not present

## 2021-07-12 DIAGNOSIS — Z7969 Long term (current) use of other immunomodulators and immunosuppressants: Secondary | ICD-10-CM | POA: Diagnosis not present

## 2021-07-12 DIAGNOSIS — Z87898 Personal history of other specified conditions: Secondary | ICD-10-CM | POA: Diagnosis not present

## 2021-07-12 DIAGNOSIS — Z4822 Encounter for aftercare following kidney transplant: Secondary | ICD-10-CM | POA: Diagnosis not present

## 2021-07-28 DIAGNOSIS — Z79899 Other long term (current) drug therapy: Secondary | ICD-10-CM | POA: Diagnosis not present

## 2021-07-28 DIAGNOSIS — D849 Immunodeficiency, unspecified: Secondary | ICD-10-CM | POA: Diagnosis not present

## 2021-07-28 DIAGNOSIS — Z4822 Encounter for aftercare following kidney transplant: Secondary | ICD-10-CM | POA: Diagnosis not present

## 2021-08-11 DIAGNOSIS — Z79899 Other long term (current) drug therapy: Secondary | ICD-10-CM | POA: Diagnosis not present

## 2021-08-11 DIAGNOSIS — Z7982 Long term (current) use of aspirin: Secondary | ICD-10-CM | POA: Diagnosis not present

## 2021-08-11 DIAGNOSIS — Z7952 Long term (current) use of systemic steroids: Secondary | ICD-10-CM | POA: Diagnosis not present

## 2021-08-11 DIAGNOSIS — Z95828 Presence of other vascular implants and grafts: Secondary | ICD-10-CM | POA: Diagnosis not present

## 2021-08-11 DIAGNOSIS — I2510 Atherosclerotic heart disease of native coronary artery without angina pectoris: Secondary | ICD-10-CM | POA: Diagnosis not present

## 2021-08-11 DIAGNOSIS — I10 Essential (primary) hypertension: Secondary | ICD-10-CM | POA: Diagnosis not present

## 2021-08-11 DIAGNOSIS — B9789 Other viral agents as the cause of diseases classified elsewhere: Secondary | ICD-10-CM | POA: Diagnosis not present

## 2021-08-11 DIAGNOSIS — Z792 Long term (current) use of antibiotics: Secondary | ICD-10-CM | POA: Diagnosis not present

## 2021-08-11 DIAGNOSIS — K219 Gastro-esophageal reflux disease without esophagitis: Secondary | ICD-10-CM | POA: Diagnosis not present

## 2021-08-11 DIAGNOSIS — L659 Nonscarring hair loss, unspecified: Secondary | ICD-10-CM | POA: Diagnosis not present

## 2021-08-11 DIAGNOSIS — Z4822 Encounter for aftercare following kidney transplant: Secondary | ICD-10-CM | POA: Diagnosis not present

## 2021-08-11 DIAGNOSIS — Z955 Presence of coronary angioplasty implant and graft: Secondary | ICD-10-CM | POA: Diagnosis not present

## 2021-08-12 ENCOUNTER — Other Ambulatory Visit: Payer: Self-pay | Admitting: Cardiovascular Disease

## 2021-08-22 DIAGNOSIS — H35713 Central serous chorioretinopathy, bilateral: Secondary | ICD-10-CM | POA: Diagnosis not present

## 2021-08-22 DIAGNOSIS — M329 Systemic lupus erythematosus, unspecified: Secondary | ICD-10-CM | POA: Diagnosis not present

## 2021-08-22 DIAGNOSIS — H3554 Dystrophies primarily involving the retinal pigment epithelium: Secondary | ICD-10-CM | POA: Diagnosis not present

## 2021-08-22 DIAGNOSIS — H35361 Drusen (degenerative) of macula, right eye: Secondary | ICD-10-CM | POA: Diagnosis not present

## 2021-08-22 DIAGNOSIS — H318 Other specified disorders of choroid: Secondary | ICD-10-CM | POA: Diagnosis not present

## 2021-08-22 DIAGNOSIS — H2513 Age-related nuclear cataract, bilateral: Secondary | ICD-10-CM | POA: Diagnosis not present

## 2021-08-24 DIAGNOSIS — E785 Hyperlipidemia, unspecified: Secondary | ICD-10-CM | POA: Diagnosis not present

## 2021-08-24 DIAGNOSIS — Z79899 Other long term (current) drug therapy: Secondary | ICD-10-CM | POA: Diagnosis not present

## 2021-08-24 DIAGNOSIS — Z940 Kidney transplant status: Secondary | ICD-10-CM | POA: Diagnosis not present

## 2021-08-24 DIAGNOSIS — Z79631 Long term (current) use of antimetabolite agent: Secondary | ICD-10-CM | POA: Diagnosis not present

## 2021-08-24 DIAGNOSIS — Z5181 Encounter for therapeutic drug level monitoring: Secondary | ICD-10-CM | POA: Diagnosis not present

## 2021-08-24 DIAGNOSIS — R112 Nausea with vomiting, unspecified: Secondary | ICD-10-CM | POA: Diagnosis not present

## 2021-08-24 DIAGNOSIS — Z95828 Presence of other vascular implants and grafts: Secondary | ICD-10-CM | POA: Diagnosis not present

## 2021-08-24 DIAGNOSIS — Z4822 Encounter for aftercare following kidney transplant: Secondary | ICD-10-CM | POA: Diagnosis not present

## 2021-08-24 DIAGNOSIS — I7090 Unspecified atherosclerosis: Secondary | ICD-10-CM | POA: Diagnosis not present

## 2021-08-24 DIAGNOSIS — Z992 Dependence on renal dialysis: Secondary | ICD-10-CM | POA: Diagnosis not present

## 2021-08-24 DIAGNOSIS — D72819 Decreased white blood cell count, unspecified: Secondary | ICD-10-CM | POA: Diagnosis not present

## 2021-08-24 DIAGNOSIS — I120 Hypertensive chronic kidney disease with stage 5 chronic kidney disease or end stage renal disease: Secondary | ICD-10-CM | POA: Diagnosis not present

## 2021-08-24 DIAGNOSIS — Z7952 Long term (current) use of systemic steroids: Secondary | ICD-10-CM | POA: Diagnosis not present

## 2021-08-24 DIAGNOSIS — Z955 Presence of coronary angioplasty implant and graft: Secondary | ICD-10-CM | POA: Diagnosis not present

## 2021-08-24 DIAGNOSIS — D631 Anemia in chronic kidney disease: Secondary | ICD-10-CM | POA: Diagnosis not present

## 2021-08-24 DIAGNOSIS — D649 Anemia, unspecified: Secondary | ICD-10-CM | POA: Diagnosis not present

## 2021-08-24 DIAGNOSIS — M329 Systemic lupus erythematosus, unspecified: Secondary | ICD-10-CM | POA: Diagnosis not present

## 2021-08-24 DIAGNOSIS — K219 Gastro-esophageal reflux disease without esophagitis: Secondary | ICD-10-CM | POA: Diagnosis not present

## 2021-08-24 DIAGNOSIS — T8612 Kidney transplant failure: Secondary | ICD-10-CM | POA: Diagnosis not present

## 2021-08-24 DIAGNOSIS — Z79621 Long term (current) use of calcineurin inhibitor: Secondary | ICD-10-CM | POA: Diagnosis not present

## 2021-08-24 DIAGNOSIS — I2510 Atherosclerotic heart disease of native coronary artery without angina pectoris: Secondary | ICD-10-CM | POA: Diagnosis not present

## 2021-08-24 DIAGNOSIS — R350 Frequency of micturition: Secondary | ICD-10-CM | POA: Diagnosis not present

## 2021-08-24 DIAGNOSIS — Z79624 Long term (current) use of inhibitors of nucleotide synthesis: Secondary | ICD-10-CM | POA: Diagnosis not present

## 2021-08-24 DIAGNOSIS — B348 Other viral infections of unspecified site: Secondary | ICD-10-CM | POA: Diagnosis not present

## 2021-08-24 DIAGNOSIS — N186 End stage renal disease: Secondary | ICD-10-CM | POA: Diagnosis not present

## 2021-08-24 DIAGNOSIS — D84821 Immunodeficiency due to drugs: Secondary | ICD-10-CM | POA: Diagnosis not present

## 2021-08-24 DIAGNOSIS — I10 Essential (primary) hypertension: Secondary | ICD-10-CM | POA: Diagnosis not present

## 2021-08-24 DIAGNOSIS — Z87898 Personal history of other specified conditions: Secondary | ICD-10-CM | POA: Diagnosis not present

## 2021-08-24 DIAGNOSIS — B259 Cytomegaloviral disease, unspecified: Secondary | ICD-10-CM | POA: Diagnosis not present

## 2021-08-30 ENCOUNTER — Ambulatory Visit: Payer: Medicare Other | Admitting: Cardiovascular Disease

## 2021-09-07 DIAGNOSIS — Z4822 Encounter for aftercare following kidney transplant: Secondary | ICD-10-CM | POA: Diagnosis not present

## 2021-09-07 DIAGNOSIS — Z7952 Long term (current) use of systemic steroids: Secondary | ICD-10-CM | POA: Diagnosis not present

## 2021-09-07 DIAGNOSIS — I151 Hypertension secondary to other renal disorders: Secondary | ICD-10-CM | POA: Diagnosis not present

## 2021-09-07 DIAGNOSIS — D72819 Decreased white blood cell count, unspecified: Secondary | ICD-10-CM | POA: Diagnosis not present

## 2021-09-07 DIAGNOSIS — H35719 Central serous chorioretinopathy, unspecified eye: Secondary | ICD-10-CM | POA: Diagnosis not present

## 2021-09-07 DIAGNOSIS — Z9889 Other specified postprocedural states: Secondary | ICD-10-CM | POA: Diagnosis not present

## 2021-09-07 DIAGNOSIS — H35713 Central serous chorioretinopathy, bilateral: Secondary | ICD-10-CM | POA: Diagnosis not present

## 2021-09-07 DIAGNOSIS — Z79621 Long term (current) use of calcineurin inhibitor: Secondary | ICD-10-CM | POA: Diagnosis not present

## 2021-09-07 DIAGNOSIS — K219 Gastro-esophageal reflux disease without esophagitis: Secondary | ICD-10-CM | POA: Diagnosis not present

## 2021-09-07 DIAGNOSIS — Z940 Kidney transplant status: Secondary | ICD-10-CM | POA: Diagnosis not present

## 2021-09-07 DIAGNOSIS — Z79899 Other long term (current) drug therapy: Secondary | ICD-10-CM | POA: Diagnosis not present

## 2021-09-07 DIAGNOSIS — N2889 Other specified disorders of kidney and ureter: Secondary | ICD-10-CM | POA: Diagnosis not present

## 2021-09-07 DIAGNOSIS — I10 Essential (primary) hypertension: Secondary | ICD-10-CM | POA: Diagnosis not present

## 2021-09-07 DIAGNOSIS — D849 Immunodeficiency, unspecified: Secondary | ICD-10-CM | POA: Diagnosis not present

## 2021-09-07 DIAGNOSIS — B349 Viral infection, unspecified: Secondary | ICD-10-CM | POA: Diagnosis not present

## 2021-09-07 DIAGNOSIS — Z86718 Personal history of other venous thrombosis and embolism: Secondary | ICD-10-CM | POA: Diagnosis not present

## 2021-09-07 DIAGNOSIS — Z5181 Encounter for therapeutic drug level monitoring: Secondary | ICD-10-CM | POA: Diagnosis not present

## 2021-09-07 DIAGNOSIS — I25119 Atherosclerotic heart disease of native coronary artery with unspecified angina pectoris: Secondary | ICD-10-CM | POA: Diagnosis not present

## 2021-09-07 DIAGNOSIS — D649 Anemia, unspecified: Secondary | ICD-10-CM | POA: Diagnosis not present

## 2021-09-07 DIAGNOSIS — B348 Other viral infections of unspecified site: Secondary | ICD-10-CM | POA: Diagnosis not present

## 2021-09-07 DIAGNOSIS — Z792 Long term (current) use of antibiotics: Secondary | ICD-10-CM | POA: Diagnosis not present

## 2021-09-07 DIAGNOSIS — Z955 Presence of coronary angioplasty implant and graft: Secondary | ICD-10-CM | POA: Diagnosis not present

## 2021-09-07 DIAGNOSIS — Z8739 Personal history of other diseases of the musculoskeletal system and connective tissue: Secondary | ICD-10-CM | POA: Diagnosis not present

## 2021-09-07 DIAGNOSIS — E785 Hyperlipidemia, unspecified: Secondary | ICD-10-CM | POA: Diagnosis not present

## 2021-09-19 DIAGNOSIS — H35712 Central serous chorioretinopathy, left eye: Secondary | ICD-10-CM | POA: Diagnosis not present

## 2021-09-19 DIAGNOSIS — H2513 Age-related nuclear cataract, bilateral: Secondary | ICD-10-CM | POA: Diagnosis not present

## 2021-09-19 DIAGNOSIS — H318 Other specified disorders of choroid: Secondary | ICD-10-CM | POA: Diagnosis not present

## 2021-09-19 DIAGNOSIS — H353221 Exudative age-related macular degeneration, left eye, with active choroidal neovascularization: Secondary | ICD-10-CM | POA: Diagnosis not present

## 2021-09-21 DIAGNOSIS — K2289 Other specified disease of esophagus: Secondary | ICD-10-CM | POA: Diagnosis not present

## 2021-09-21 DIAGNOSIS — B348 Other viral infections of unspecified site: Secondary | ICD-10-CM | POA: Diagnosis not present

## 2021-09-21 DIAGNOSIS — Z79899 Other long term (current) drug therapy: Secondary | ICD-10-CM | POA: Diagnosis not present

## 2021-09-21 DIAGNOSIS — Z4822 Encounter for aftercare following kidney transplant: Secondary | ICD-10-CM | POA: Diagnosis not present

## 2021-09-21 DIAGNOSIS — Z5181 Encounter for therapeutic drug level monitoring: Secondary | ICD-10-CM | POA: Diagnosis not present

## 2021-09-21 DIAGNOSIS — Z792 Long term (current) use of antibiotics: Secondary | ICD-10-CM | POA: Diagnosis not present

## 2021-09-21 DIAGNOSIS — Z955 Presence of coronary angioplasty implant and graft: Secondary | ICD-10-CM | POA: Diagnosis not present

## 2021-09-21 DIAGNOSIS — L299 Pruritus, unspecified: Secondary | ICD-10-CM | POA: Diagnosis not present

## 2021-09-21 DIAGNOSIS — Z7952 Long term (current) use of systemic steroids: Secondary | ICD-10-CM | POA: Diagnosis not present

## 2021-09-21 DIAGNOSIS — Z79621 Long term (current) use of calcineurin inhibitor: Secondary | ICD-10-CM | POA: Diagnosis not present

## 2021-09-21 DIAGNOSIS — N2889 Other specified disorders of kidney and ureter: Secondary | ICD-10-CM | POA: Diagnosis not present

## 2021-09-21 DIAGNOSIS — E785 Hyperlipidemia, unspecified: Secondary | ICD-10-CM | POA: Diagnosis not present

## 2021-09-21 DIAGNOSIS — B349 Viral infection, unspecified: Secondary | ICD-10-CM | POA: Diagnosis not present

## 2021-09-21 DIAGNOSIS — Z940 Kidney transplant status: Secondary | ICD-10-CM | POA: Diagnosis not present

## 2021-09-21 DIAGNOSIS — I151 Hypertension secondary to other renal disorders: Secondary | ICD-10-CM | POA: Diagnosis not present

## 2021-09-21 DIAGNOSIS — Z8739 Personal history of other diseases of the musculoskeletal system and connective tissue: Secondary | ICD-10-CM | POA: Diagnosis not present

## 2021-09-21 DIAGNOSIS — Z8719 Personal history of other diseases of the digestive system: Secondary | ICD-10-CM | POA: Diagnosis not present

## 2021-09-21 DIAGNOSIS — D72819 Decreased white blood cell count, unspecified: Secondary | ICD-10-CM | POA: Diagnosis not present

## 2021-09-21 DIAGNOSIS — I10 Essential (primary) hypertension: Secondary | ICD-10-CM | POA: Diagnosis not present

## 2021-09-21 DIAGNOSIS — H35719 Central serous chorioretinopathy, unspecified eye: Secondary | ICD-10-CM | POA: Diagnosis not present

## 2021-09-21 DIAGNOSIS — D649 Anemia, unspecified: Secondary | ICD-10-CM | POA: Diagnosis not present

## 2021-09-21 DIAGNOSIS — D849 Immunodeficiency, unspecified: Secondary | ICD-10-CM | POA: Diagnosis not present

## 2021-09-21 DIAGNOSIS — K219 Gastro-esophageal reflux disease without esophagitis: Secondary | ICD-10-CM | POA: Diagnosis not present

## 2021-09-21 DIAGNOSIS — R42 Dizziness and giddiness: Secondary | ICD-10-CM | POA: Diagnosis not present

## 2021-09-21 DIAGNOSIS — R112 Nausea with vomiting, unspecified: Secondary | ICD-10-CM | POA: Diagnosis not present

## 2021-09-21 DIAGNOSIS — I2510 Atherosclerotic heart disease of native coronary artery without angina pectoris: Secondary | ICD-10-CM | POA: Diagnosis not present

## 2021-09-22 ENCOUNTER — Encounter: Payer: Self-pay | Admitting: Cardiovascular Disease

## 2021-09-22 ENCOUNTER — Ambulatory Visit (INDEPENDENT_AMBULATORY_CARE_PROVIDER_SITE_OTHER): Payer: Medicare Other | Admitting: Cardiovascular Disease

## 2021-09-22 DIAGNOSIS — E782 Mixed hyperlipidemia: Secondary | ICD-10-CM | POA: Diagnosis not present

## 2021-09-22 DIAGNOSIS — I10 Essential (primary) hypertension: Secondary | ICD-10-CM | POA: Diagnosis not present

## 2021-09-22 DIAGNOSIS — I214 Non-ST elevation (NSTEMI) myocardial infarction: Secondary | ICD-10-CM

## 2021-09-22 NOTE — Assessment & Plan Note (Signed)
History of essential hypertension with blood pressure measured today at 138/84.  She is on carvedilol and amlodipine.

## 2021-09-22 NOTE — Assessment & Plan Note (Signed)
History of CAD status post non-STEMI 08/26/2017 with catheterization performed Dr. Angelena Form via the femoral approach revealing a subtotally occluded first marginal branch with that was stented with a synergy drug-eluting stent (2.25 mm x 20 mm long).  She had a fairly focal 80% mid dominant RCA stenosis which was not intervened on at that time because of renal insufficiency.  I performed RCA PCI and stenting electively as an outpatient 12/31/2017 with a Synergy drug-eluting stent via the femoral approach.  She is done well since that time.  She denies chest pain or shortness of breath.

## 2021-09-22 NOTE — Patient Instructions (Signed)
Medication Instructions:  ?Your physician recommends that you continue on your current medications as directed. Please refer to the Current Medication list given to you today.  ?*If you need a refill on your cardiac medications before your next appointment, please call your pharmacy* ? ? ?Lab Work: ?None ?If you have labs (blood work) drawn today and your tests are completely normal, you will receive your results only by: ?MyChart Message (if you have MyChart) OR ?A paper copy in the mail ?If you have any lab test that is abnormal or we need to change your treatment, we will call you to review the results. ? ? ?Testing/Procedures: ?None ? ? ?Follow-Up: ?At [facility], you and your health needs are our priority.  As part of our continuing mission to provide you with exceptional heart care, we have created designated Provider Care Teams.  These Care Teams include your primary Cardiologist (physician) and Advanced Practice Providers (APPs -  Physician Assistants and Nurse Practitioners) who all work together to provide you with the care you need, when you need it. ? ?We recommend signing up for the patient portal called "MyChart".  Sign up information is provided on this After Visit Summary.  MyChart is used to connect with patients for Virtual Visits (Telemedicine).  Patients are able to view lab/test results, encounter notes, upcoming appointments, etc.  Non-urgent messages can be sent to your provider as well.   ?To learn more about what you can do with MyChart, go to [email or web address].   ? ?Your next appointment:   ?1 year(s) ? ?The format for your next appointment:   ?In Person ? ?Provider:   ?Jonathan Berry, MD   ? ? ?Other Instructions ? ? ?Important Information About Sugar ? ? ? ? ?  ?

## 2021-09-22 NOTE — Progress Notes (Signed)
08/09/3265 [name]   [birth year]  [id number]  Primary Physician Patient, No Pcp Per Primary Cardiologist: Lorretta Harp MD FACP, [place], [place], [place]  HPI:  Lindsey York is a 56 y.o.  moderately overweight divorced African-American female mother of 2 children who I last saw in the office 11/09/2020.  She has a history of systemic lupus erythematosus, CKD 4 with serum creatinine is in the 5-6 range predialysis previously on the transplant list at [facility].  She also has history of treated hypertension hyperlipidemia.  She currently does not work.  She has never smoked.  She had a non-STEMI 08/26/2017 and catheterization performed by Dr. Angelena Form via the right femoral approach revealing a subtotally occluded first marginal branch which was stented with a synergy drug-eluting stent (2.25 mm x 20 mill meters).  She did have an 80% fairly focal mid dominant RCA stenosis which was not intervened on because of her renal insufficiency.  She is otherwise asymptomatic but wishes to continue on the renal transplant list which cannot occur until her RCA is intervened on.   I performed RCA PCI and drug-eluting stenting electively as an outpatient 12/31/2017 with a synergy drug-eluting stent via the right femoral approach.  She has done well since and remains on dual antiplatelet therapy.  I did use only 25 cc of contrast and this did not affect her renal function.   She did start hemodialysis in March of 2021 and is doing well with that.  She had some bleeding from her fistula site and stopped her aspirin.  We did stop her Brilinta last year because of bleeding from her dialysis site.  Since I saw her a year ago she continues to do well.  She did end up having a renal transplant at [facility] 01/31/2021 and has been doing well since.  She is no longer on dialysis.  She denies chest pain or shortness of breath.  Current Meds  Medication Sig   acetaminophen (TYLENOL)  325 MG tablet Take 650 mg by mouth every 6 (six) hours as needed for moderate pain.   acyclovir ointment (ZOVIRAX) 5 % Apply twice a day to affected area as needed   amLODipine (NORVASC) 5 MG tablet Take 5 mg by mouth daily.   aspirin EC 81 MG tablet Take 1 tablet (81 mg total) by mouth daily. Swallow whole.   B COMPLEX-C-FOLIC ACID ER PO Take by mouth.   bevacizumab (AVASTIN) 400 MG/16ML SOLN Inject 1.25 mg as directed every 30 (thirty) days. Pt takes as needed.   carvedilol (COREG) 12.5 MG tablet Take 12.5 mg by mouth 2 (two) times daily with a meal.   ENVARSUS XR 0.75 MG TB24 tablet Take 0.75 mg by mouth daily.   ENVARSUS XR 1 MG TB24 Take 1 mg by mouth daily.   ezetimibe (ZETIA) 10 MG tablet TAKE 1 TABLET(10 MG) BY MOUTH DAILY   famotidine (PEPCID) 20 MG tablet Take 20 mg by mouth daily.   mycophenolate (MYFORTIC) 180 MG EC tablet Take by mouth.   predniSONE (DELTASONE) 5 MG tablet Take by mouth.   simvastatin (ZOCOR) 20 MG tablet TAKE 1 TABLET(20 MG) BY MOUTH AT BEDTIME   sucralfate (CARAFATE) 1 g tablet Take 1 g by mouth 2 (two) times daily.   valGANciclovir (VALCYTE) 450 MG tablet Take 450 mg by mouth daily.   [DISCONTINUED] carvedilol (COREG) 3.125 MG tablet TAKE 1 TABLET(3.125 MG) BY MOUTH TWICE DAILY WITH A MEAL  Allergies  Allergen Reactions   Hydroxychloroquine Hives and Rash   Codeine     Social History   Socioeconomic History   Marital status: Divorced    Spouse name: Not on file   Number of children: Not on file   Years of education: Not on file   Highest education level: Not on file  Occupational History   Not on file  Tobacco Use   Smoking status: Never   Smokeless tobacco: Never  Vaping Use   Vaping Use: Never used  Substance and Sexual Activity   Alcohol use: Never   Drug use: Never   Sexual activity: Yes  Other Topics Concern   Not on file  Social History Narrative   Not on file   Social Determinants of Health   Financial Resource Strain: Not  on file  Food Insecurity: Not on file  Transportation Needs: Not on file  Physical Activity: Not on file  Stress: Not on file  Social Connections: Not on file  Intimate Partner Violence: Not on file     Review of Systems: General: negative for chills, fever, night sweats or weight changes.  Cardiovascular: negative for chest pain, dyspnea on exertion, edema, orthopnea, palpitations, paroxysmal nocturnal dyspnea or shortness of breath Dermatological: negative for rash Respiratory: negative for cough or wheezing Urologic: negative for hematuria Abdominal: negative for nausea, vomiting, diarrhea, bright red blood per rectum, melena, or hematemesis Neurologic: negative for visual changes, syncope, or dizziness All other systems reviewed and are otherwise negative except as noted above.    Blood pressure 138/84, pulse 63, height '5\' 3"'$  (1.6 m), weight 157 lb (71.2 kg).  General appearance: alert and no distress Neck: no adenopathy, no carotid bruit, no JVD, supple, symmetrical, trachea midline, and thyroid not enlarged, symmetric, no tenderness/mass/nodules Lungs: clear to auscultation bilaterally Heart: regular rate and rhythm, S1, S2 normal, no murmur, click, rub or gallop Extremities: extremities normal, atraumatic, no cyanosis or edema Pulses: 2+ and symmetric Skin: Skin color, texture, turgor normal. No rashes or lesions Neurologic: Grossly normal  EKG not performed today  ASSESSMENT AND PLAN:   NSTEMI (non-ST elevated myocardial infarction) ([place]) History of CAD status post non-STEMI 08/26/2017 with catheterization performed Dr. Angelena Form via the femoral approach revealing a subtotally occluded first marginal branch with that was stented with a synergy drug-eluting stent (2.25 mm x 20 mm long).  She had a fairly focal 80% mid dominant RCA stenosis which was not intervened on at that time because of renal insufficiency.  I performed RCA PCI and stenting electively as an outpatient  12/31/2017 with a Synergy drug-eluting stent via the femoral approach.  She is done well since that time.  She denies chest pain or shortness of breath.   Hypertension History of essential hypertension with blood pressure measured today at 138/84.  She is on carvedilol and amlodipine.  Hyperlipidemia History of hyperlipidemia on simvastatin and Zetia with lipid profile performed 08/20/2021 revealing a total cholesterol 126, LDL 66 and HDL 46.     Lorretta Harp MD [facility], [facility] 09/22/2021 9:41 AM

## 2021-09-22 NOTE — Assessment & Plan Note (Signed)
History of hyperlipidemia on simvastatin and Zetia with lipid profile performed 08/20/2021 revealing a total cholesterol 126, LDL 66 and HDL 46.

## 2021-10-06 DIAGNOSIS — Z5181 Encounter for therapeutic drug level monitoring: Secondary | ICD-10-CM | POA: Diagnosis not present

## 2021-10-06 DIAGNOSIS — M1A9XX0 Chronic gout, unspecified, without tophus (tophi): Secondary | ICD-10-CM | POA: Diagnosis not present

## 2021-10-06 DIAGNOSIS — Z4822 Encounter for aftercare following kidney transplant: Secondary | ICD-10-CM | POA: Diagnosis not present

## 2021-10-06 DIAGNOSIS — M329 Systemic lupus erythematosus, unspecified: Secondary | ICD-10-CM | POA: Diagnosis not present

## 2021-10-06 DIAGNOSIS — M3214 Glomerular disease in systemic lupus erythematosus: Secondary | ICD-10-CM | POA: Diagnosis not present

## 2021-10-06 DIAGNOSIS — M109 Gout, unspecified: Secondary | ICD-10-CM | POA: Diagnosis not present

## 2021-10-06 DIAGNOSIS — D696 Thrombocytopenia, unspecified: Secondary | ICD-10-CM | POA: Diagnosis not present

## 2021-10-06 DIAGNOSIS — Z79621 Long term (current) use of calcineurin inhibitor: Secondary | ICD-10-CM | POA: Diagnosis not present

## 2021-10-06 DIAGNOSIS — Z79899 Other long term (current) drug therapy: Secondary | ICD-10-CM | POA: Diagnosis not present

## 2021-10-06 DIAGNOSIS — Z7952 Long term (current) use of systemic steroids: Secondary | ICD-10-CM | POA: Diagnosis not present

## 2021-10-06 DIAGNOSIS — Z79624 Long term (current) use of inhibitors of nucleotide synthesis: Secondary | ICD-10-CM | POA: Diagnosis not present

## 2021-10-06 DIAGNOSIS — Z940 Kidney transplant status: Secondary | ICD-10-CM | POA: Diagnosis not present

## 2021-10-19 DIAGNOSIS — Z95828 Presence of other vascular implants and grafts: Secondary | ICD-10-CM | POA: Diagnosis not present

## 2021-10-19 DIAGNOSIS — Z4822 Encounter for aftercare following kidney transplant: Secondary | ICD-10-CM | POA: Diagnosis not present

## 2021-10-19 DIAGNOSIS — Z940 Kidney transplant status: Secondary | ICD-10-CM | POA: Diagnosis not present

## 2021-10-19 DIAGNOSIS — B348 Other viral infections of unspecified site: Secondary | ICD-10-CM | POA: Diagnosis not present

## 2021-10-19 DIAGNOSIS — K219 Gastro-esophageal reflux disease without esophagitis: Secondary | ICD-10-CM | POA: Diagnosis not present

## 2021-10-19 DIAGNOSIS — Z955 Presence of coronary angioplasty implant and graft: Secondary | ICD-10-CM | POA: Diagnosis not present

## 2021-10-19 DIAGNOSIS — B349 Viral infection, unspecified: Secondary | ICD-10-CM | POA: Diagnosis not present

## 2021-10-19 DIAGNOSIS — H35719 Central serous chorioretinopathy, unspecified eye: Secondary | ICD-10-CM | POA: Diagnosis not present

## 2021-10-19 DIAGNOSIS — R232 Flushing: Secondary | ICD-10-CM | POA: Diagnosis not present

## 2021-10-19 DIAGNOSIS — Z79624 Long term (current) use of inhibitors of nucleotide synthesis: Secondary | ICD-10-CM | POA: Diagnosis not present

## 2021-10-19 DIAGNOSIS — E785 Hyperlipidemia, unspecified: Secondary | ICD-10-CM | POA: Diagnosis not present

## 2021-10-19 DIAGNOSIS — Z792 Long term (current) use of antibiotics: Secondary | ICD-10-CM | POA: Diagnosis not present

## 2021-10-19 DIAGNOSIS — Z79621 Long term (current) use of calcineurin inhibitor: Secondary | ICD-10-CM | POA: Diagnosis not present

## 2021-10-19 DIAGNOSIS — D849 Immunodeficiency, unspecified: Secondary | ICD-10-CM | POA: Diagnosis not present

## 2021-10-19 DIAGNOSIS — I25119 Atherosclerotic heart disease of native coronary artery with unspecified angina pectoris: Secondary | ICD-10-CM | POA: Diagnosis not present

## 2021-10-19 DIAGNOSIS — Z7952 Long term (current) use of systemic steroids: Secondary | ICD-10-CM | POA: Diagnosis not present

## 2021-10-19 DIAGNOSIS — I10 Essential (primary) hypertension: Secondary | ICD-10-CM | POA: Diagnosis not present

## 2021-10-19 DIAGNOSIS — Z79899 Other long term (current) drug therapy: Secondary | ICD-10-CM | POA: Diagnosis not present

## 2021-10-19 DIAGNOSIS — Z8739 Personal history of other diseases of the musculoskeletal system and connective tissue: Secondary | ICD-10-CM | POA: Diagnosis not present

## 2021-10-19 DIAGNOSIS — Z5181 Encounter for therapeutic drug level monitoring: Secondary | ICD-10-CM | POA: Diagnosis not present

## 2021-11-02 DIAGNOSIS — Z4822 Encounter for aftercare following kidney transplant: Secondary | ICD-10-CM | POA: Diagnosis not present

## 2021-11-16 DIAGNOSIS — D8989 Other specified disorders involving the immune mechanism, not elsewhere classified: Secondary | ICD-10-CM | POA: Diagnosis not present

## 2021-11-16 DIAGNOSIS — Z79899 Other long term (current) drug therapy: Secondary | ICD-10-CM | POA: Diagnosis not present

## 2021-11-16 DIAGNOSIS — I10 Essential (primary) hypertension: Secondary | ICD-10-CM | POA: Diagnosis not present

## 2021-11-16 DIAGNOSIS — B9789 Other viral agents as the cause of diseases classified elsewhere: Secondary | ICD-10-CM | POA: Diagnosis not present

## 2021-11-16 DIAGNOSIS — Z7952 Long term (current) use of systemic steroids: Secondary | ICD-10-CM | POA: Diagnosis not present

## 2021-11-16 DIAGNOSIS — Z79621 Long term (current) use of calcineurin inhibitor: Secondary | ICD-10-CM | POA: Diagnosis not present

## 2021-11-16 DIAGNOSIS — M329 Systemic lupus erythematosus, unspecified: Secondary | ICD-10-CM | POA: Diagnosis not present

## 2021-11-16 DIAGNOSIS — Z79620 Long term (current) use of immunosuppressive biologic: Secondary | ICD-10-CM | POA: Diagnosis not present

## 2021-11-16 DIAGNOSIS — Z955 Presence of coronary angioplasty implant and graft: Secondary | ICD-10-CM | POA: Diagnosis not present

## 2021-11-16 DIAGNOSIS — I25119 Atherosclerotic heart disease of native coronary artery with unspecified angina pectoris: Secondary | ICD-10-CM | POA: Diagnosis not present

## 2021-11-16 DIAGNOSIS — I2510 Atherosclerotic heart disease of native coronary artery without angina pectoris: Secondary | ICD-10-CM | POA: Diagnosis not present

## 2021-11-16 DIAGNOSIS — R232 Flushing: Secondary | ICD-10-CM | POA: Diagnosis not present

## 2021-11-16 DIAGNOSIS — Z4822 Encounter for aftercare following kidney transplant: Secondary | ICD-10-CM | POA: Diagnosis not present

## 2021-11-16 DIAGNOSIS — H35719 Central serous chorioretinopathy, unspecified eye: Secondary | ICD-10-CM | POA: Diagnosis not present

## 2021-11-16 DIAGNOSIS — K219 Gastro-esophageal reflux disease without esophagitis: Secondary | ICD-10-CM | POA: Diagnosis not present

## 2021-11-16 DIAGNOSIS — Z940 Kidney transplant status: Secondary | ICD-10-CM | POA: Diagnosis not present

## 2021-11-16 DIAGNOSIS — Z792 Long term (current) use of antibiotics: Secondary | ICD-10-CM | POA: Diagnosis not present

## 2021-11-16 DIAGNOSIS — M3214 Glomerular disease in systemic lupus erythematosus: Secondary | ICD-10-CM | POA: Diagnosis not present

## 2021-11-16 DIAGNOSIS — R112 Nausea with vomiting, unspecified: Secondary | ICD-10-CM | POA: Diagnosis not present

## 2021-11-16 DIAGNOSIS — R7989 Other specified abnormal findings of blood chemistry: Secondary | ICD-10-CM | POA: Diagnosis not present

## 2021-11-16 DIAGNOSIS — E785 Hyperlipidemia, unspecified: Secondary | ICD-10-CM | POA: Diagnosis not present

## 2021-11-30 DIAGNOSIS — Z4822 Encounter for aftercare following kidney transplant: Secondary | ICD-10-CM | POA: Diagnosis not present

## 2021-12-14 DIAGNOSIS — Z79899 Other long term (current) drug therapy: Secondary | ICD-10-CM | POA: Diagnosis not present

## 2021-12-14 DIAGNOSIS — Z79621 Long term (current) use of calcineurin inhibitor: Secondary | ICD-10-CM | POA: Diagnosis not present

## 2021-12-14 DIAGNOSIS — Z7952 Long term (current) use of systemic steroids: Secondary | ICD-10-CM | POA: Diagnosis not present

## 2021-12-14 DIAGNOSIS — Z5181 Encounter for therapeutic drug level monitoring: Secondary | ICD-10-CM | POA: Diagnosis not present

## 2021-12-14 DIAGNOSIS — R600 Localized edema: Secondary | ICD-10-CM | POA: Diagnosis not present

## 2021-12-14 DIAGNOSIS — Z79624 Long term (current) use of inhibitors of nucleotide synthesis: Secondary | ICD-10-CM | POA: Diagnosis not present

## 2021-12-14 DIAGNOSIS — Z8739 Personal history of other diseases of the musculoskeletal system and connective tissue: Secondary | ICD-10-CM | POA: Diagnosis not present

## 2021-12-14 DIAGNOSIS — I25119 Atherosclerotic heart disease of native coronary artery with unspecified angina pectoris: Secondary | ICD-10-CM | POA: Diagnosis not present

## 2021-12-14 DIAGNOSIS — Z955 Presence of coronary angioplasty implant and graft: Secondary | ICD-10-CM | POA: Diagnosis not present

## 2021-12-14 DIAGNOSIS — I10 Essential (primary) hypertension: Secondary | ICD-10-CM | POA: Diagnosis not present

## 2021-12-14 DIAGNOSIS — Z792 Long term (current) use of antibiotics: Secondary | ICD-10-CM | POA: Diagnosis not present

## 2021-12-14 DIAGNOSIS — Z4822 Encounter for aftercare following kidney transplant: Secondary | ICD-10-CM | POA: Diagnosis not present

## 2021-12-14 DIAGNOSIS — B338 Other specified viral diseases: Secondary | ICD-10-CM | POA: Diagnosis not present

## 2021-12-15 ENCOUNTER — Other Ambulatory Visit: Payer: Self-pay | Admitting: Cardiovascular Disease

## 2021-12-28 DIAGNOSIS — Z79621 Long term (current) use of calcineurin inhibitor: Secondary | ICD-10-CM | POA: Diagnosis not present

## 2021-12-28 DIAGNOSIS — Z5181 Encounter for therapeutic drug level monitoring: Secondary | ICD-10-CM | POA: Diagnosis not present

## 2021-12-28 DIAGNOSIS — Z4822 Encounter for aftercare following kidney transplant: Secondary | ICD-10-CM | POA: Diagnosis not present

## 2022-01-11 DIAGNOSIS — Z5181 Encounter for therapeutic drug level monitoring: Secondary | ICD-10-CM | POA: Diagnosis not present

## 2022-01-11 DIAGNOSIS — D849 Immunodeficiency, unspecified: Secondary | ICD-10-CM | POA: Diagnosis not present

## 2022-01-11 DIAGNOSIS — B259 Cytomegaloviral disease, unspecified: Secondary | ICD-10-CM | POA: Diagnosis not present

## 2022-01-11 DIAGNOSIS — Z79621 Long term (current) use of calcineurin inhibitor: Secondary | ICD-10-CM | POA: Diagnosis not present

## 2022-01-11 DIAGNOSIS — Z940 Kidney transplant status: Secondary | ICD-10-CM | POA: Diagnosis not present

## 2022-01-11 DIAGNOSIS — M3214 Glomerular disease in systemic lupus erythematosus: Secondary | ICD-10-CM | POA: Diagnosis not present

## 2022-01-11 DIAGNOSIS — E1136 Type 2 diabetes mellitus with diabetic cataract: Secondary | ICD-10-CM | POA: Diagnosis not present

## 2022-01-11 DIAGNOSIS — E785 Hyperlipidemia, unspecified: Secondary | ICD-10-CM | POA: Diagnosis not present

## 2022-01-11 DIAGNOSIS — Z7952 Long term (current) use of systemic steroids: Secondary | ICD-10-CM | POA: Diagnosis not present

## 2022-01-11 DIAGNOSIS — H35719 Central serous chorioretinopathy, unspecified eye: Secondary | ICD-10-CM | POA: Diagnosis not present

## 2022-01-11 DIAGNOSIS — Z79899 Other long term (current) drug therapy: Secondary | ICD-10-CM | POA: Diagnosis not present

## 2022-01-11 DIAGNOSIS — D84821 Immunodeficiency due to drugs: Secondary | ICD-10-CM | POA: Diagnosis not present

## 2022-01-11 DIAGNOSIS — I10 Essential (primary) hypertension: Secondary | ICD-10-CM | POA: Diagnosis not present

## 2022-01-11 DIAGNOSIS — Z79624 Long term (current) use of inhibitors of nucleotide synthesis: Secondary | ICD-10-CM | POA: Diagnosis not present

## 2022-01-11 DIAGNOSIS — K219 Gastro-esophageal reflux disease without esophagitis: Secondary | ICD-10-CM | POA: Diagnosis not present

## 2022-01-11 DIAGNOSIS — B348 Other viral infections of unspecified site: Secondary | ICD-10-CM | POA: Diagnosis not present

## 2022-01-11 DIAGNOSIS — Z792 Long term (current) use of antibiotics: Secondary | ICD-10-CM | POA: Diagnosis not present

## 2022-01-11 DIAGNOSIS — R600 Localized edema: Secondary | ICD-10-CM | POA: Diagnosis not present

## 2022-01-11 DIAGNOSIS — Z4822 Encounter for aftercare following kidney transplant: Secondary | ICD-10-CM | POA: Diagnosis not present

## 2022-01-11 DIAGNOSIS — I25119 Atherosclerotic heart disease of native coronary artery with unspecified angina pectoris: Secondary | ICD-10-CM | POA: Diagnosis not present

## 2022-01-26 DIAGNOSIS — Z955 Presence of coronary angioplasty implant and graft: Secondary | ICD-10-CM | POA: Diagnosis not present

## 2022-01-26 DIAGNOSIS — Z7952 Long term (current) use of systemic steroids: Secondary | ICD-10-CM | POA: Diagnosis not present

## 2022-01-26 DIAGNOSIS — Z79621 Long term (current) use of calcineurin inhibitor: Secondary | ICD-10-CM | POA: Diagnosis not present

## 2022-01-26 DIAGNOSIS — Z79624 Long term (current) use of inhibitors of nucleotide synthesis: Secondary | ICD-10-CM | POA: Diagnosis not present

## 2022-01-26 DIAGNOSIS — Z79899 Other long term (current) drug therapy: Secondary | ICD-10-CM | POA: Diagnosis not present

## 2022-01-26 DIAGNOSIS — M328 Other forms of systemic lupus erythematosus: Secondary | ICD-10-CM | POA: Diagnosis not present

## 2022-01-26 DIAGNOSIS — I10 Essential (primary) hypertension: Secondary | ICD-10-CM | POA: Diagnosis not present

## 2022-01-26 DIAGNOSIS — Z940 Kidney transplant status: Secondary | ICD-10-CM | POA: Diagnosis not present

## 2022-01-26 DIAGNOSIS — D849 Immunodeficiency, unspecified: Secondary | ICD-10-CM | POA: Diagnosis not present

## 2022-01-26 DIAGNOSIS — Z4822 Encounter for aftercare following kidney transplant: Secondary | ICD-10-CM | POA: Diagnosis not present

## 2022-01-26 DIAGNOSIS — I2510 Atherosclerotic heart disease of native coronary artery without angina pectoris: Secondary | ICD-10-CM | POA: Diagnosis not present

## 2022-01-26 DIAGNOSIS — B338 Other specified viral diseases: Secondary | ICD-10-CM | POA: Diagnosis not present

## 2022-01-26 DIAGNOSIS — H35712 Central serous chorioretinopathy, left eye: Secondary | ICD-10-CM | POA: Diagnosis not present

## 2022-02-06 ENCOUNTER — Ambulatory Visit: Payer: Self-pay | Admitting: Licensed Clinical Social Worker

## 2022-02-06 NOTE — Patient Instructions (Signed)
Visit Information  Thank you for taking time to visit with me today. Please don't hesitate to contact me if I can be of assistance to you before our next scheduled telephone appointment.  Following are the goals we discussed today:   Our next appointment is by telephone on 02/28/22 at 10:00 AM   Please call the care guide team at [phone] if you need to cancel or reschedule your appointment.   If you are experiencing a Mental Health or [place] or need someone to talk to, please go to [facility] [place] [phone])   Following is a copy of your full plan of care:   Interventions: Informed client about program services. Discussed help with RN, LCSW and Pharmacist. Client asked about past RD consult in 2019 for client.  LCSW looked up in [facility] RD note for client with RD on 09/27/2017. and gave client contact name of RD and Department of RD. through [facility]. Client wants to call this RD to talk with her about nutrition needs of client . Client said she has been gaining some weight and wants to talk with that particular RD about her weight issues and dietary needs. Client said she would call [place] and ask for [facility] to speak with representative about her dietary questions and needs. Client and LCSW spoke of her history of kidney transplant. Client also spoke of her use of medication, Predisone.  Provided counseling support for client  Ms. Hasten was given information about Care Management services by the embedded care coordination team including:  Care Management services include personalized support from designated clinical staff supervised by her physician, including individualized plan of care and coordination with other care providers 24/7 contact phone numbers for assistance for urgent and routine care needs. The patient may stop CCM services at any time (effective at the  end of the month) by phone call to the office staff.  Patient agreed to services and verbal consent obtained.   Norva Riffle.Dorotea Hand MSW, [place] representative [facility] Care Management [phone]

## 2022-02-06 NOTE — Patient Outreach (Signed)
  Care Coordination   Initial Visit Note   29/08/6211 Name: Lindsey York MRN: [id number] DOB: [birth year]  Lindsey York is a 56 y.o. year old female who sees Patient, No Pcp Per for primary care. I spoke with  [name] by phone today.  What matters to the patients health and wellness today? Client wants to talk with representative of [facility] about her dietary questions    Goals Addressed               This Visit's Progress     Patient is interested in program support. she asked about past contact with RD through [facility] ([place]) (pt-stated)        Interventions: Informed client about program services. Discussed help with RN, LCSW and Pharmacist. Client asked about past RD consult in 2019 for client.  LCSW looked up in [facility] RD note for client with RD on 09/27/2017. and gave client contact name of RD and Department of RD. through [facility]. Client wants to call this RD to talk with her about nutrition needs of client . Client said she has been gaining some weight and wants to talk with that particular RD about her weight issues and dietary needs. Client said she would call [place] and ask for [facility] to speak with representative about her dietary questions and needs. Client and LCSW spoke of her history of kidney transplant. Client also spoke of her use of medication, Predisone.  Provided counseling support for client     SDOH assessments and interventions completed:  Yes  SDOH Interventions Today    Flowsheet Row Most Recent Value  SDOH Interventions   Depression Interventions/Treatment  Counseling  Stress Interventions Provide Counseling  [client has stress in managing medical needs]        Care Coordination Interventions Activated:  Yes  Care Coordination Interventions:  Yes, provided   Follow up plan: Follow up call scheduled for 02/28/22 at 10:00 AM     Encounter Outcome:  Pt. Visit Completed   Norva Riffle.Waldemar Siegel MSW, [place] Holiday representative [facility] Care Management [phone]

## 2022-02-08 DIAGNOSIS — D84821 Immunodeficiency due to drugs: Secondary | ICD-10-CM | POA: Diagnosis not present

## 2022-02-08 DIAGNOSIS — Z79624 Long term (current) use of inhibitors of nucleotide synthesis: Secondary | ICD-10-CM | POA: Diagnosis not present

## 2022-02-08 DIAGNOSIS — H35719 Central serous chorioretinopathy, unspecified eye: Secondary | ICD-10-CM | POA: Diagnosis not present

## 2022-02-08 DIAGNOSIS — M3214 Glomerular disease in systemic lupus erythematosus: Secondary | ICD-10-CM | POA: Diagnosis not present

## 2022-02-08 DIAGNOSIS — E785 Hyperlipidemia, unspecified: Secondary | ICD-10-CM | POA: Diagnosis not present

## 2022-02-08 DIAGNOSIS — Z79621 Long term (current) use of calcineurin inhibitor: Secondary | ICD-10-CM | POA: Diagnosis not present

## 2022-02-08 DIAGNOSIS — Z79899 Other long term (current) drug therapy: Secondary | ICD-10-CM | POA: Diagnosis not present

## 2022-02-08 DIAGNOSIS — B259 Cytomegaloviral disease, unspecified: Secondary | ICD-10-CM | POA: Diagnosis not present

## 2022-02-08 DIAGNOSIS — Z940 Kidney transplant status: Secondary | ICD-10-CM | POA: Diagnosis not present

## 2022-02-08 DIAGNOSIS — I25119 Atherosclerotic heart disease of native coronary artery with unspecified angina pectoris: Secondary | ICD-10-CM | POA: Diagnosis not present

## 2022-02-08 DIAGNOSIS — Z4822 Encounter for aftercare following kidney transplant: Secondary | ICD-10-CM | POA: Diagnosis not present

## 2022-02-08 DIAGNOSIS — B348 Other viral infections of unspecified site: Secondary | ICD-10-CM | POA: Diagnosis not present

## 2022-02-08 DIAGNOSIS — K219 Gastro-esophageal reflux disease without esophagitis: Secondary | ICD-10-CM | POA: Diagnosis not present

## 2022-02-08 DIAGNOSIS — I10 Essential (primary) hypertension: Secondary | ICD-10-CM | POA: Diagnosis not present

## 2022-02-08 DIAGNOSIS — Z7952 Long term (current) use of systemic steroids: Secondary | ICD-10-CM | POA: Diagnosis not present

## 2022-02-08 DIAGNOSIS — R112 Nausea with vomiting, unspecified: Secondary | ICD-10-CM | POA: Diagnosis not present

## 2022-02-08 DIAGNOSIS — D849 Immunodeficiency, unspecified: Secondary | ICD-10-CM | POA: Diagnosis not present

## 2022-02-10 DIAGNOSIS — H35713 Central serous chorioretinopathy, bilateral: Secondary | ICD-10-CM | POA: Diagnosis not present

## 2022-02-13 ENCOUNTER — Other Ambulatory Visit: Payer: Self-pay | Admitting: Cardiovascular Disease

## 2022-02-20 DIAGNOSIS — Z1231 Encounter for screening mammogram for malignant neoplasm of breast: Secondary | ICD-10-CM | POA: Diagnosis not present

## 2022-02-28 ENCOUNTER — Ambulatory Visit: Payer: Self-pay | Admitting: Licensed Clinical Social Worker

## 2022-02-28 ENCOUNTER — Telehealth: Payer: Self-pay | Admitting: Cardiovascular Disease

## 2022-02-28 DIAGNOSIS — N185 Chronic kidney disease, stage 5: Secondary | ICD-10-CM

## 2022-02-28 DIAGNOSIS — I10 Essential (primary) hypertension: Secondary | ICD-10-CM

## 2022-02-28 NOTE — Telephone Encounter (Signed)
Pt notified someone will be calling to schedule

## 2022-02-28 NOTE — Patient Outreach (Signed)
  Care Coordination   Follow Up Visit Note   16/01/9603 Name: Lindsey York MRN: [id number] DOB: [birth year]  Lindsey York is a 56 y.o. year old female who sees Patient, No Pcp Per for primary care. I spoke with  [name] by phone today.  What matters to the patients health and wellness today?  Client is interested in program support. She is trying to get referral to [facility] at [place]               This Visit's Progress     Patient is interested in program support. she asked about past contact with RD through [facility] ([place]) (pt-stated)        Interventions: Informed client about program services. Discussed help with RN, LCSW and Pharmacist.  Discussed medication procurement of client Discussed energy level of client. Client said she sometimes has decreased energy Discussed support from cardiologist, Dr. Quay Burow Discussed interest of client in support with [facility] with [facility].  Client is trying to get referral to that Center at [facility].  Provided counseling support for client Reviewed sleep challenges of client Encouraged client to call LCSW at [phone] as needed for program support     SDOH assessments and interventions completed:  Yes  SDOH Interventions Today    Flowsheet Row Most Recent Value  SDOH Interventions   Depression Interventions/Treatment  Counseling  Stress Interventions Provide Counseling  [client has stress related to managing medical needs]        Care Coordination Interventions:  Yes, provided   Follow up plan: Follow up call scheduled for 03/20/22 at 3:30 PM     Encounter Outcome:  Pt. Visit Completed   Norva Riffle.Hector Taft MSW, [place] Holiday representative [facility] Care Management [phone]

## 2022-02-28 NOTE — Telephone Encounter (Signed)
  Pt is requesting if Dr. Gwenlyn Found can send a referral for her to see Dr. Sandie Ano at [place] nutrition & diabetes education services at [facility]

## 2022-02-28 NOTE — Patient Instructions (Signed)
Visit Information  Thank you for taking time to visit with me today. Please don't hesitate to contact me if I can be of assistance to you before our next scheduled telephone appointment.  Following are the goals we discussed today:   Our next appointment is by telephone on 03/20/22 at 3:30 PM   Please call the care guide team at [phone] if you need to cancel or reschedule your appointment.   If you are experiencing a Mental Health or [place] or need someone to talk to, please go to [facility] [place] [phone])   Following is a copy of your full plan of care:  Interventions:  Informed client about program services. Discussed help with RN, LCSW and Pharmacist.  Discussed medication procurement of client Discussed energy level of client. Client said she sometimes has decreased energy Discussed support from cardiologist, Dr. Quay Burow Discussed interest of client in support with [facility] with [facility].  Client is trying to get referral to that Center at [facility].  Provided counseling support for client Reviewed sleep challenges of client Encouraged client to call LCSW at [phone] as needed for program support  Ms. Michie was given information about Care Management services by the embedded care coordination team including:  Care Management services include personalized support from designated clinical staff supervised by her physician, including individualized plan of care and coordination with other care providers 24/7 contact phone numbers for assistance for urgent and routine care needs. The patient may stop CCM services at any time (effective at the end of the month) by phone call to the office staff.  Patient agreed to services and verbal consent obtained.   Norva Riffle.Demitris Pokorny MSW, [place] Holiday representative [facility] Care Management [phone]

## 2022-03-01 ENCOUNTER — Encounter (INDEPENDENT_AMBULATORY_CARE_PROVIDER_SITE_OTHER): Payer: Self-pay

## 2022-03-09 DIAGNOSIS — Z940 Kidney transplant status: Secondary | ICD-10-CM | POA: Diagnosis not present

## 2022-03-09 DIAGNOSIS — I10 Essential (primary) hypertension: Secondary | ICD-10-CM | POA: Diagnosis not present

## 2022-03-09 DIAGNOSIS — M109 Gout, unspecified: Secondary | ICD-10-CM | POA: Diagnosis not present

## 2022-03-09 DIAGNOSIS — M3214 Glomerular disease in systemic lupus erythematosus: Secondary | ICD-10-CM | POA: Diagnosis not present

## 2022-03-09 DIAGNOSIS — Z4822 Encounter for aftercare following kidney transplant: Secondary | ICD-10-CM | POA: Diagnosis not present

## 2022-03-09 DIAGNOSIS — I25119 Atherosclerotic heart disease of native coronary artery with unspecified angina pectoris: Secondary | ICD-10-CM | POA: Diagnosis not present

## 2022-03-09 DIAGNOSIS — M810 Age-related osteoporosis without current pathological fracture: Secondary | ICD-10-CM | POA: Diagnosis not present

## 2022-03-09 DIAGNOSIS — Z79624 Long term (current) use of inhibitors of nucleotide synthesis: Secondary | ICD-10-CM | POA: Diagnosis not present

## 2022-03-09 DIAGNOSIS — R7303 Prediabetes: Secondary | ICD-10-CM | POA: Diagnosis not present

## 2022-03-09 DIAGNOSIS — Z79899 Other long term (current) drug therapy: Secondary | ICD-10-CM | POA: Diagnosis not present

## 2022-03-09 DIAGNOSIS — M329 Systemic lupus erythematosus, unspecified: Secondary | ICD-10-CM | POA: Diagnosis not present

## 2022-03-09 DIAGNOSIS — Z7952 Long term (current) use of systemic steroids: Secondary | ICD-10-CM | POA: Diagnosis not present

## 2022-03-09 DIAGNOSIS — E559 Vitamin D deficiency, unspecified: Secondary | ICD-10-CM | POA: Diagnosis not present

## 2022-03-09 DIAGNOSIS — N185 Chronic kidney disease, stage 5: Secondary | ICD-10-CM | POA: Diagnosis not present

## 2022-03-09 DIAGNOSIS — I214 Non-ST elevation (NSTEMI) myocardial infarction: Secondary | ICD-10-CM | POA: Diagnosis not present

## 2022-03-09 DIAGNOSIS — N052 Unspecified nephritic syndrome with diffuse membranous glomerulonephritis: Secondary | ICD-10-CM | POA: Diagnosis not present

## 2022-03-09 DIAGNOSIS — E785 Hyperlipidemia, unspecified: Secondary | ICD-10-CM | POA: Diagnosis not present

## 2022-03-09 DIAGNOSIS — D631 Anemia in chronic kidney disease: Secondary | ICD-10-CM | POA: Diagnosis not present

## 2022-03-09 DIAGNOSIS — N184 Chronic kidney disease, stage 4 (severe): Secondary | ICD-10-CM | POA: Diagnosis not present

## 2022-03-09 DIAGNOSIS — L509 Urticaria, unspecified: Secondary | ICD-10-CM | POA: Diagnosis not present

## 2022-03-09 DIAGNOSIS — Z79621 Long term (current) use of calcineurin inhibitor: Secondary | ICD-10-CM | POA: Diagnosis not present

## 2022-03-09 DIAGNOSIS — Z79631 Long term (current) use of antimetabolite agent: Secondary | ICD-10-CM | POA: Diagnosis not present

## 2022-03-09 DIAGNOSIS — M79641 Pain in right hand: Secondary | ICD-10-CM | POA: Diagnosis not present

## 2022-03-09 DIAGNOSIS — I2510 Atherosclerotic heart disease of native coronary artery without angina pectoris: Secondary | ICD-10-CM | POA: Diagnosis not present

## 2022-03-09 DIAGNOSIS — H35719 Central serous chorioretinopathy, unspecified eye: Secondary | ICD-10-CM | POA: Diagnosis not present

## 2022-03-20 DIAGNOSIS — Z780 Asymptomatic menopausal state: Secondary | ICD-10-CM | POA: Diagnosis not present

## 2022-03-29 ENCOUNTER — Ambulatory Visit (INDEPENDENT_AMBULATORY_CARE_PROVIDER_SITE_OTHER): Payer: Medicare Other | Admitting: Internal Medicine

## 2022-03-29 ENCOUNTER — Encounter (INDEPENDENT_AMBULATORY_CARE_PROVIDER_SITE_OTHER): Payer: Self-pay | Admitting: Internal Medicine

## 2022-03-29 VITALS — BP 102/71 | HR 72 | Temp 98.2°F | Ht 63.0 in | Wt 185.6 lb

## 2022-03-29 DIAGNOSIS — I120 Hypertensive chronic kidney disease with stage 5 chronic kidney disease or end stage renal disease: Secondary | ICD-10-CM

## 2022-03-29 DIAGNOSIS — M329 Systemic lupus erythematosus, unspecified: Secondary | ICD-10-CM | POA: Diagnosis not present

## 2022-03-29 DIAGNOSIS — N186 End stage renal disease: Secondary | ICD-10-CM | POA: Diagnosis not present

## 2022-03-29 DIAGNOSIS — E669 Obesity, unspecified: Secondary | ICD-10-CM | POA: Diagnosis not present

## 2022-03-29 DIAGNOSIS — Z6832 Body mass index (BMI) 32.0-32.9, adult: Secondary | ICD-10-CM

## 2022-03-29 DIAGNOSIS — I10 Essential (primary) hypertension: Secondary | ICD-10-CM

## 2022-03-29 NOTE — Progress Notes (Signed)
Office: [phone]  /  Fax: [phone]   Initial Visit  Lindsey York was seen in clinic today to evaluate for obesity. She is interested in losing weight to improve overall health and reduce the risk of weight related complications. She presents today to review program treatment options, initial physical assessment, and evaluation.     She was referred by: Self-Referral  When asked what else they would like to accomplish? She states: Lose a target amount of weight : 20-25  When asked how has your weight affected you? She states: Has affected self-esteem, Contributed to medical problems, Having fatigue, Having poor endurance, and Problems with eating patterns  Some associated conditions: Hypertension, Heart disease, and Kidney disease, vitamin D deficiency  Contributing factors: Disruption of circadian rhythm, Medications, and Reduced physical activity  Weight promoting medications identified: Steroids and Other: Immunosuppressants  Current nutrition plan: None  Current level of physical activity: None  Current or previous pharmacotherapy: None  Response to medication: Never tried medications   Past medical history includes:   Past Medical History:  Diagnosis Date   Anemia    CKD (chronic kidney disease), stage V ([place])    "followed at [facility]" (08/28/2017)   Coronary artery disease    Diarrhea 09/2017   GERD (gastroesophageal reflux disease)    High cholesterol    History of gout    "on daily RX"" (08/28/2017)   Hypertension    NSTEMI (non-ST elevated myocardial infarction) ([place]) 08/26/2017   Systemic lupus (HCC)      Objective:   BP 102/71   Pulse 72   Temp 98.2 F (36.8 C)   Ht '5\' 3"'$  (1.6 m)   Wt 185 lb 9.6 oz (84.2 kg)   SpO2 98%   BMI 32.88 kg/m  She was weighed on the bioimpedance scale: Body mass index is 32.88 kg/m.  Peak Weight: 212, Body Fat%: 42.2, Visceral Fat Rating: 11, Weight trend over the last 12 months: Decreasing  General:  Alert,  oriented and cooperative. Patient is in no acute distress.  Respiratory: Normal respiratory effort, no problems with respiration noted  Extremities: Normal range of motion.    Mental Status: Normal mood and affect. Normal behavior. Normal judgment and thought content.   Assessment and Plan:  1. Class 1 obesity without serious comorbidity with body mass index (BMI) of 32.0 to 32.9 in adult, unspecified obesity type We reviewed weight, biometrics, associated medical conditions and contributing factors with patient. She would benefit from weight loss therapy via a modified calorie, low-carb, high-protein nutritional plan tailored to their REE (resting energy expenditure) which will be determined by indirect calorimetry.  We will also assess for cardiometabolic risk and nutritional derangements via fasting serologies at her next appointment.  2. ESRD (end stage renal disease) ([place]) Status post kidney transplant about a year ago at [place].  On Imuran, mycophenolate and prednisone.  Most recent GFR was 37 according to patient portal.  Her blood pressure is well-controlled she is avoiding nephrotoxins.  She will follow-up at the transplant center.  3. Hypertension, unspecified type At goal for age and comorbid conditions.  Continue amlodipine.  Check renal parameters at intake labs  4. Systemic lupus erythematosus, unspecified SLE type, unspecified organ involvement status ([place]) On Imuran and also mycophenolate.  We will evaluate for cytopenias with intake labs.  Continue current regimen     Obesity Treatment / Action Plan:  Patient will work on garnering support from family and friends to begin weight loss journey.  Will work on eliminating or reducing the presence of highly palatable, calorie dense foods in the home. Will complete provided nutritional and psychosocial assessment questionnaire before the next appointment. Will be scheduled for indirect calorimetry to determine  resting energy expenditure in a fasting state.  This will allow [place] to create a reduced calorie, high-protein meal plan to promote loss of fat mass while preserving muscle mass. Will think about ideas on how to incorporate physical activity into their daily routine. Counseled on the health benefits of losing 5%-15% of total body weight. Was counseled on nutritional approaches to weight loss and benefits of complex carbs and high quality protein as part of nutritional weight management. Was counseled on pharmacotherapy and role as an adjunct in weight management.   Obesity Education Performed Today:  She was weighed on the bioimpedance scale and results were discussed and documented in the synopsis.  We discussed obesity as a disease and the importance of a more detailed evaluation of all the factors contributing to the disease.  We discussed the importance of long term lifestyle changes which include nutrition, exercise and behavioral modifications as well as the importance of customizing this to her specific health and social needs.  We discussed the benefits of reaching a healthier weight to alleviate the symptoms of existing conditions and reduce the risks of the biomechanical, metabolic and psychological effects of obesity.  Cleatus Goodin appears to be in the action stage of change and states they are ready to start intensive lifestyle modifications and behavioral modifications.  30 minutes was spent today on this visit including the above counseling, pre-visit chart review, and post-visit documentation.  Reviewed by clinician on day of visit: allergies, medications, problem list, medical history, surgical history, family history, social history, and previous encounter notes.    I have reviewed the above documentation for accuracy and completeness, and I agree with the above.  Thomes Dinning, MD

## 2022-04-04 DIAGNOSIS — R8290 Unspecified abnormal findings in urine: Secondary | ICD-10-CM | POA: Diagnosis not present

## 2022-04-04 DIAGNOSIS — Z4822 Encounter for aftercare following kidney transplant: Secondary | ICD-10-CM | POA: Diagnosis not present

## 2022-04-06 DIAGNOSIS — Z09 Encounter for follow-up examination after completed treatment for conditions other than malignant neoplasm: Secondary | ICD-10-CM | POA: Diagnosis not present

## 2022-04-06 DIAGNOSIS — Z940 Kidney transplant status: Secondary | ICD-10-CM | POA: Diagnosis not present

## 2022-04-10 DIAGNOSIS — Z0289 Encounter for other administrative examinations: Secondary | ICD-10-CM

## 2022-04-13 ENCOUNTER — Encounter (INDEPENDENT_AMBULATORY_CARE_PROVIDER_SITE_OTHER): Payer: Self-pay | Admitting: Internal Medicine

## 2022-04-14 DIAGNOSIS — Z79899 Other long term (current) drug therapy: Secondary | ICD-10-CM | POA: Diagnosis not present

## 2022-04-14 DIAGNOSIS — Z4822 Encounter for aftercare following kidney transplant: Secondary | ICD-10-CM | POA: Diagnosis not present

## 2022-04-20 DIAGNOSIS — I2510 Atherosclerotic heart disease of native coronary artery without angina pectoris: Secondary | ICD-10-CM | POA: Diagnosis not present

## 2022-04-20 DIAGNOSIS — Z7952 Long term (current) use of systemic steroids: Secondary | ICD-10-CM | POA: Diagnosis not present

## 2022-04-20 DIAGNOSIS — N189 Chronic kidney disease, unspecified: Secondary | ICD-10-CM | POA: Diagnosis not present

## 2022-04-20 DIAGNOSIS — D631 Anemia in chronic kidney disease: Secondary | ICD-10-CM | POA: Diagnosis not present

## 2022-04-20 DIAGNOSIS — D849 Immunodeficiency, unspecified: Secondary | ICD-10-CM | POA: Diagnosis not present

## 2022-04-20 DIAGNOSIS — D649 Anemia, unspecified: Secondary | ICD-10-CM | POA: Diagnosis not present

## 2022-04-20 DIAGNOSIS — M1A09X0 Idiopathic chronic gout, multiple sites, without tophus (tophi): Secondary | ICD-10-CM | POA: Diagnosis not present

## 2022-04-20 DIAGNOSIS — Z940 Kidney transplant status: Secondary | ICD-10-CM | POA: Diagnosis not present

## 2022-04-20 DIAGNOSIS — M810 Age-related osteoporosis without current pathological fracture: Secondary | ICD-10-CM | POA: Diagnosis not present

## 2022-04-20 DIAGNOSIS — Z79899 Other long term (current) drug therapy: Secondary | ICD-10-CM | POA: Diagnosis not present

## 2022-04-20 DIAGNOSIS — M3214 Glomerular disease in systemic lupus erythematosus: Secondary | ICD-10-CM | POA: Diagnosis not present

## 2022-04-20 DIAGNOSIS — M329 Systemic lupus erythematosus, unspecified: Secondary | ICD-10-CM | POA: Diagnosis not present

## 2022-05-03 ENCOUNTER — Ambulatory Visit (INDEPENDENT_AMBULATORY_CARE_PROVIDER_SITE_OTHER): Payer: Medicare Other | Admitting: Internal Medicine

## 2022-05-03 ENCOUNTER — Encounter (INDEPENDENT_AMBULATORY_CARE_PROVIDER_SITE_OTHER): Payer: Self-pay | Admitting: Internal Medicine

## 2022-05-03 VITALS — BP 139/54 | HR 60 | Temp 98.1°F | Ht 63.0 in | Wt 187.0 lb

## 2022-05-03 DIAGNOSIS — Z6833 Body mass index (BMI) 33.0-33.9, adult: Secondary | ICD-10-CM

## 2022-05-03 DIAGNOSIS — R5383 Other fatigue: Secondary | ICD-10-CM

## 2022-05-03 DIAGNOSIS — Z940 Kidney transplant status: Secondary | ICD-10-CM | POA: Diagnosis not present

## 2022-05-03 DIAGNOSIS — E782 Mixed hyperlipidemia: Secondary | ICD-10-CM

## 2022-05-03 DIAGNOSIS — N186 End stage renal disease: Secondary | ICD-10-CM | POA: Diagnosis not present

## 2022-05-03 DIAGNOSIS — R0602 Shortness of breath: Secondary | ICD-10-CM | POA: Diagnosis not present

## 2022-05-03 DIAGNOSIS — Z1331 Encounter for screening for depression: Secondary | ICD-10-CM

## 2022-05-03 DIAGNOSIS — D7589 Other specified diseases of blood and blood-forming organs: Secondary | ICD-10-CM | POA: Diagnosis not present

## 2022-05-03 DIAGNOSIS — I10 Essential (primary) hypertension: Secondary | ICD-10-CM

## 2022-05-03 DIAGNOSIS — I120 Hypertensive chronic kidney disease with stage 5 chronic kidney disease or end stage renal disease: Secondary | ICD-10-CM

## 2022-05-03 DIAGNOSIS — E669 Obesity, unspecified: Secondary | ICD-10-CM

## 2022-05-03 DIAGNOSIS — R7303 Prediabetes: Secondary | ICD-10-CM | POA: Diagnosis not present

## 2022-05-03 NOTE — Assessment & Plan Note (Signed)
Patient with a history of non-ST MI in 2019.  She is currently on moderate intensity statin therapy with simvastatin 20 mg a day.  She may benefit to switching to a different statin possibly rosuvastatin if it does not interact with her immunosuppressants.  Her last LDL was 76 goal would be closer to 72 considering her history of coronary artery disease.  Weight loss may improve lipid profile but she still warrants statin therapy.

## 2022-05-03 NOTE — Assessment & Plan Note (Signed)
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  Currently on Envarsus and prednisone and azathioprine.  She is on sulfamethizole for infection prophylaxis.  These medications may contribute to weight gain and may also make weight loss difficult.  Patient made aware of it.  She will follow-up with transplant team continue current regimen.  Patient also advised to stay updated on immunizations as she is immunocompromised.

## 2022-05-03 NOTE — Assessment & Plan Note (Signed)
Blood pressure slightly above target.  Her blood pressure goal should be less than 130/80 or 120/80 if tolerated.  She is currently on carvedilol 18.75 twice a day, amlodipine 2.5 mg a day.  I reviewed most recent renal parameters and her GFR was 36.  She had a low magnesium of 1.8 and normal phosphorus.  She has been followed by the transplant team at [place] with weight loss therapy.  Monitor for symptoms of orthostasis while losing weight. Continue current regimen and home monitoring for a goal blood pressure of 120/80.

## 2022-05-03 NOTE — Assessment & Plan Note (Addendum)
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  Currently on Envarsus and prednisone and azathioprine.  She is on sulfamethizole for infection prophylaxis.  These medications may contribute to weight gain and may also make weight loss difficult.  Patient made aware of it.  She will follow-up with transplant team continue current regimen.  Patient also advised to stay updated on immunizations as she is immunocompromised.

## 2022-05-03 NOTE — Progress Notes (Unsigned)
Chief Complaint:   OBESITY Lindsey York (MR# [id number]) is a 57 y.o. female who presents for evaluation and treatment of obesity and related comorbidities. Current BMI is Body mass index is 33.13 kg/m. Lindsey York has been struggling with her weight for many years and has been unsuccessful in either losing weight, maintaining weight loss, or reaching her healthy weight goal.  Lindsey York is currently in the action stage of change and ready to dedicate time achieving and maintaining a healthier weight. Lindsey York is interested in becoming our patient and working on intensive lifestyle modifications including (but not limited to) diet and exercise for weight loss.  Lindsey York's habits were reviewed today and are as follows: Her family eats meals together, she thinks her family will eat healthier with her, her desired weight loss is 22 lbs, she started gaining weight with pre-diabetes and on steroids, her heaviest weight ever was 204 pounds, she snacks frequently in the evenings, she skips meals frequently, she is frequently drinking liquids with calories, she frequently makes poor food choices, and she struggles with emotional eating.  Depression Screen Lindsey York's Food and Mood (modified PHQ-9) score was 4.  Subjective:   1. Other fatigue Lindsey York admits to daytime somnolence and admits to waking up still tired. Patient has a history of symptoms of daytime fatigue and morning fatigue. Lindsey York generally gets 6 hours of sleep per night, and states that she has nightime awakenings. Snoring is not present. Apneic episodes are not present. Epworth Sleepiness Score is 5.   2. SOB (shortness of breath) on exertion Lindsey York notes increasing shortness of breath with exercising and seems to be worsening over time with weight gain. She notes getting out of breath sooner with activity than she used to. This has not gotten worse recently. Retia denies shortness of breath at rest or orthopnea.  3. 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  Currently on Envarsus and prednisone and azathioprine.  She is on sulfamethizole for infection prophylaxis.  These medications may contribute to weight gain and may also make weight loss difficult.  Patient made aware of it.    4. Hypertension, unspecified type Blood pressure slightly above target.  Her blood pressure goal should be less than 130/80 or 120/80 if tolerated.  She is currently on carvedilol 18.75 twice a day, amlodipine 2.5 mg a day.  I reviewed most recent renal parameters and her GFR was 36.  She had a low magnesium of 1.8 and normal phosphorus.  She has been followed by the transplant team at [facility].  5. Prediabetes Most recent A1c is 6.1 from January 2024.   Patient informed of disease state and risk of progression. This may contribute to abnormal cravings, fatigue and diabetes complications without having diabetes.   6. Increased MCV Patient has a mild anemia with a hemoglobin of 12 she has an increased MCV at 96.3 which may be due to immunosuppressants but her B12 levels were also borderline low.    7. 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  Currently on Envarsus and prednisone and azathioprine.  She is on sulfamethizole for infection prophylaxis.  These medications may contribute to weight gain and may also make weight loss difficult.  Patient made aware of it.    8. Mixed hyperlipidemia Patient with a history of non-ST MI in 2019.  She is currently on moderate intensity statin therapy with simvastatin 20 mg a day.  She may benefit to switching to a different statin possibly rosuvastatin if it does not interact with her immunosuppressants.  Her last LDL was 76 goal would be closer to 82 considering her history of coronary artery disease.    Assessment/Plan:   1. Other fatigue Lindsey York does feel  that her weight is causing her energy to be lower than it should be. Fatigue may be related to obesity, depression or many other causes. Labs will be ordered, and in the meanwhile, [name] will focus on self care including making healthy food choices, increasing physical activity and focusing on stress reduction.  - EKG 12-Lead  2. SOB (shortness of breath) on exertion [name] does feel that she gets out of breath more easily that she used to when she exercises. Lindsey York's shortness of breath appears to be obesity related and exercise induced. She has agreed to work on weight loss and gradually increase exercise to treat her exercise induced shortness of breath. Will continue to monitor closely.  3. ESRD (end stage renal disease) ([name]) She will follow-up with transplant team continue current regimen.  Patient also advised to stay updated on immunizations as she is immunocompromised.  4. Hypertension, unspecified type Continue with weight loss therapy.  Monitor for symptoms of orthostasis while losing weight. Continue current regimen and home monitoring for a goal blood pressure of 120/80.  5. Prediabetes We reviewed treatment options which include weight loss of about 7 to 10% of body weight, increasing physical activity to 150 minutes a week of moderate intensity.  She may also be a candidate for pharmacoprophylaxis.  6. Increased MCV Patient will ask transplant team if they could draw a methylmalonic acid to see if she has B12 deficiency state.  I also recommend checking a TSH.  7. Renal transplant, status post She will follow-up with transplant team continue current regimen.  Patient also advised to stay updated on immunizations as she is immunocompromised.  8. Mixed hyperlipidemia Weight loss may improve lipid profile but she still warrants statin therapy.  9. Depression screen Lindsey York had a negative depression screening.  10. Class 1 obesity with serious comorbidity and body mass  index (BMI) of 33.0 to 33.9 in adult, unspecified obesity type Lindsey York is currently in the action stage of change and her goal is to continue with weight loss efforts. I recommend Lindsey York begin the structured treatment plan as follows:  She has agreed to the Category 1 Plan with reduced protein due to chronic kidney disease.  Exercise goals: No exercise has been prescribed at this time.   Behavioral modification strategies: increasing lean protein intake, decreasing simple carbohydrates, increasing vegetables, increasing water intake, decreasing liquid calories, increasing high fiber foods, no skipping meals, meal planning and cooking strategies, keeping healthy foods in the home, better snacking choices, avoiding temptations, and planning for success.  She was informed of the importance of frequent follow-up visits to maximize her success with intensive lifestyle modifications for her multiple health conditions. She was informed we would discuss her lab results at her next visit unless there is a critical issue that needs to be addressed sooner. Kenedi agreed to keep her next visit at the agreed upon time to discuss these results.  Objective:   Blood pressure (!) 139/54, pulse 60, temperature 98.1 F (36.7 C), height 5' 3"$  (1.6 m), weight 187 lb (84.8 kg), SpO2 100 %. Body mass index is 33.13 kg/m.  EKG: Normal sinus rhythm, rate 58 BPM.  Indirect Calorimeter completed today shows a VO2 of 203  and a REE of 1397.  Her calculated basal metabolic rate is [id number] thus her basal metabolic rate is worse than expected.  General: Cooperative, alert, well developed, in no acute distress. HEENT: Conjunctivae and lids unremarkable. Cardiovascular: Regular rhythm.  Lungs: Normal work of breathing. Neurologic: No focal deficits.   Lab Results  Component Value Date   CREATININE 7.80 (H) 11/06/2018   BUN 73 (H) 11/06/2018   NA 144 11/06/2018   K 4.8 11/06/2018   CL 107 (H) 11/06/2018   CO2 17 (L)  11/06/2018   Lab Results  Component Value Date   ALT 9 11/06/2018   AST 10 11/06/2018   ALKPHOS 66 11/06/2018   BILITOT 0.2 11/06/2018   Lab Results  Component Value Date   HGBA1C 5.4 08/29/2017   No results found for: "INSULIN" No results found for: "TSH" Lab Results  Component Value Date   CHOL 141 11/06/2018   HDL 61 11/06/2018   LDLCALC 66 11/06/2018   TRIG 72 11/06/2018   CHOLHDL 2.3 11/06/2018   Lab Results  Component Value Date   WBC 11.5 (H) 01/01/2018   HGB 10.6 (L) 01/01/2018   HCT 34.0 (L) 01/01/2018   MCV 97.1 01/01/2018   PLT 185 01/01/2018   No results found for: "IRON", "TIBC", "FERRITIN"  Attestation Statements:   Reviewed by clinician on day of visit: allergies, medications, problem list, medical history, surgical history, family history, social history, and previous encounter notes.  Time spent on visit including pre-visit chart review and post-visit charting and care was 40 minutes.   Wilhemena Durie, am acting as transcriptionist for Thomes Dinning, MD.  I have reviewed the above documentation for accuracy and completeness, and I agree with the above. -Thomes Dinning, MD

## 2022-05-03 NOTE — Assessment & Plan Note (Signed)
Patient has a mild anemia with a hemoglobin of 12 she has an increased MCV at 96.3 which may be due to immunosuppressants but her B12 levels were also borderline low.  Patient will ask transplant team if they could draw a methylmalonic acid to see if she has B12 deficiency state.  I also recommend checking a TSH.

## 2022-05-03 NOTE — Assessment & Plan Note (Signed)
Most recent A1c is 6.1 from January 2024.   Patient informed of disease state and risk of progression. This may contribute to abnormal cravings, fatigue and diabetes complications without having diabetes.   We reviewed treatment options which include weight loss of about 7 to 10% of body weight, increasing physical activity to 150 minutes a week of moderate intensity.  She may also be a candidate for pharmacoprophylaxis.

## 2022-05-11 DIAGNOSIS — H35713 Central serous chorioretinopathy, bilateral: Secondary | ICD-10-CM | POA: Diagnosis not present

## 2022-05-11 DIAGNOSIS — H353111 Nonexudative age-related macular degeneration, right eye, early dry stage: Secondary | ICD-10-CM | POA: Diagnosis not present

## 2022-05-11 DIAGNOSIS — Z4822 Encounter for aftercare following kidney transplant: Secondary | ICD-10-CM | POA: Diagnosis not present

## 2022-05-11 DIAGNOSIS — H353222 Exudative age-related macular degeneration, left eye, with inactive choroidal neovascularization: Secondary | ICD-10-CM | POA: Diagnosis not present

## 2022-05-17 ENCOUNTER — Encounter (INDEPENDENT_AMBULATORY_CARE_PROVIDER_SITE_OTHER): Payer: Self-pay | Admitting: Internal Medicine

## 2022-05-17 ENCOUNTER — Ambulatory Visit (INDEPENDENT_AMBULATORY_CARE_PROVIDER_SITE_OTHER): Payer: Medicare Other | Admitting: Internal Medicine

## 2022-05-17 VITALS — BP 134/86 | HR 63 | Temp 97.6°F | Ht 63.0 in | Wt 189.0 lb

## 2022-05-17 DIAGNOSIS — I10 Essential (primary) hypertension: Secondary | ICD-10-CM | POA: Diagnosis not present

## 2022-05-17 DIAGNOSIS — I2510 Atherosclerotic heart disease of native coronary artery without angina pectoris: Secondary | ICD-10-CM | POA: Diagnosis not present

## 2022-05-17 DIAGNOSIS — R7303 Prediabetes: Secondary | ICD-10-CM

## 2022-05-17 DIAGNOSIS — E782 Mixed hyperlipidemia: Secondary | ICD-10-CM | POA: Diagnosis not present

## 2022-05-17 DIAGNOSIS — E669 Obesity, unspecified: Secondary | ICD-10-CM

## 2022-05-17 DIAGNOSIS — Z6833 Body mass index (BMI) 33.0-33.9, adult: Secondary | ICD-10-CM

## 2022-05-17 NOTE — Assessment & Plan Note (Signed)
Her blood pressure is still above goal.  She is currently on amlodipine 2.5 mg a day carvedilol 12.5 mg twice a day.  Her most recent GFR is improved at 40.  She has improved hydration.  I counseled her on goals of care her blood pressure should be closer to 120/82 protect transplanted kidney.  I advised her to monitor blood pressure at home in the mornings and also before bedtime.  If blood pressure trends are above goal she will contact transplant team for medication adjustments.

## 2022-05-17 NOTE — Assessment & Plan Note (Signed)
Stable without any signs or symptoms of angina.  She is currently on antiplatelet therapy, statin, Zetia, beta-blocker.  She will continue current regimen and work on blood pressure control.

## 2022-05-17 NOTE — Assessment & Plan Note (Signed)
Her most recent LDL is 76 goal would be less than 55 but she has a history of coronary artery disease.  She is currently on Zetia and simvastatin.  She will continue to work on nutrition.  Losing 10% of body weight may improve condition.  She is also to reduce saturated fats from her diet and continue current regimen.

## 2022-05-17 NOTE — Progress Notes (Signed)
5'3  Office: [phone]  /  Fax: [phone]  WEIGHT SUMMARY AND BIOMETRICS  Medical Weight Loss Height: 5' 3"$  (1.6 m) Weight: 189 lb (85.7 kg) Temp: 97.6 F (36.4 C) Pulse Rate: 63 BP: 134/86 SpO2: 100 % Fasting: n Labs: n Today's Visit #: 2 Weight at Last VIsit: 187 lb Weight Lost Since Last Visit: +2  Body Fat %: 45.2 % Fat Mass (lbs): 85.6 lbs Muscle Mass (lbs): 98.6 lbs Total Body Water (lbs): 74.2 lbs Visceral Fat Rating : 12 Peak Weight: 212 lb Starting Date: 05/03/22 Starting Weight: 0    HPI  Chief Complaint: OBESITY  Jakerria is here to discuss her progress with her obesity treatment plan. She is on the the Category 1 Plan and states she is following her eating plan approximately 40 % of the time. She states she is exercising 15 minutes 5 times per week.   Interval History:  Since last office visit she reduced soda to once a day.  Her implementation of plan has been slow and she acknowledges having difficulties with breakfast and lunch but has been working on trying to get a lean source of protein and more fruits and vegetables.  She has also increased the amount of water.  She notes adequate satiety and satiation.  She denies any abnormal cravings or problems with portion control.  She has not been reading food labels or tracking calories.   Pharmacotherapy: None  PHYSICAL EXAM:  Blood pressure 134/86, pulse 63, temperature 97.6 F (36.4 C), height 5' 3"$  (1.6 m), weight 189 lb (85.7 kg), SpO2 100 %. Body mass index is 33.48 kg/m.  General: She is overweight, cooperative, alert, well developed, and in no acute distress. PSYCH: Has normal mood, affect and thought process.   HEENT: EOMI, sclerae are anicteric. Lungs: Normal breathing effort, no conversational dyspnea. Extremities: No edema.  Neurologic: No gross sensory or motor deficits. No tremors or fasciculations noted.    DIAGNOSTIC DATA REVIEWED:  BMET    Component Value Date/Time   NA 144  11/06/2018 0910   K 4.8 11/06/2018 0910   CL 107 (H) 11/06/2018 0910   CO2 17 (L) 11/06/2018 0910   GLUCOSE 75 11/06/2018 0910   GLUCOSE 101 (H) 01/01/2018 0345   BUN 73 (H) 11/06/2018 0910   CREATININE 7.80 (H) 11/06/2018 0910   CALCIUM 8.3 (L) 11/06/2018 0910   GFRNONAA 5 (L) 11/06/2018 0910   GFRAA 6 (L) 11/06/2018 0910   Lab Results  Component Value Date   HGBA1C 5.4 08/29/2017   No results found for: "INSULIN" No results found for: "TSH" CBC    Component Value Date/Time   WBC 11.5 (H) 01/01/2018 0345   RBC 3.50 (L) 01/01/2018 0345   HGB 10.6 (L) 01/01/2018 0345   HGB 11.8 12/26/2017 1050   HCT 34.0 (L) 01/01/2018 0345   HCT 35.6 12/26/2017 1050   PLT 185 01/01/2018 0345   PLT 208 12/26/2017 1050   MCV 97.1 01/01/2018 0345   MCV 92 12/26/2017 1050   MCH 30.3 01/01/2018 0345   MCHC 31.2 01/01/2018 0345   RDW 16.4 (H) 01/01/2018 0345   RDW 17.2 (H) 12/26/2017 1050   Iron Studies No results found for: "IRON", "TIBC", "FERRITIN", "IRONPCTSAT" Lipid Panel     Component Value Date/Time   CHOL 141 11/06/2018 0910   TRIG 72 11/06/2018 0910   HDL 61 11/06/2018 0910   CHOLHDL 2.3 11/06/2018 0910   CHOLHDL 3.8 08/30/2017 0227   VLDL 22 08/30/2017 0227  LDLCALC 66 11/06/2018 0910   Hepatic Function Panel     Component Value Date/Time   PROT 6.9 11/06/2018 0910   ALBUMIN 4.3 11/06/2018 0910   AST 10 11/06/2018 0910   ALT 9 11/06/2018 0910   ALKPHOS 66 11/06/2018 0910   BILITOT 0.2 11/06/2018 0910   BILIDIR 0.09 11/06/2018 0910   No results found for: "TSH" Nutritional No results found for: "VD25OH"   ASSESSMENT AND PLAN  TREATMENT PLAN FOR OBESITY:  Recommended Dietary Goals  Lindsey York is currently in the action stage of change. As such, her goal is to continue weight management plan. She has agreed to the Category 1 Plan.  Behavioral Intervention  We discussed the following Behavioral Modification Strategies today: increasing lean protein intake,  increasing vegetables, increase water intake, work on meal planning and easy cooking plans, think about ways to increase physical activity, avoid or reduce consumption of processed foods, and reading food labels and making healthy choices when eating convenient foods.  Additional resources provided today: NA  Recommended Physical Activity Goals  Donie has been advised to work up to 150 minutes of moderate intensity aerobic activity a week and strengthening exercises 2-3 times per week for cardiovascular health, weight loss maintenance and preservation of muscle mass.   She has agreed to increase physical activity in their day and reduce sedentary time (increase NEAT).    Pharmacotherapy We discussed various medication options to help Loucinda with her weight loss efforts and we both agreed to continue with nutritional and behavioral approaches.  ASSOCIATED CONDITIONS ADDRESSED TODAY  Prediabetes  Primary hypertension Assessment & Plan: Her blood pressure is still above goal.  She is currently on amlodipine 2.5 mg a day carvedilol 12.5 mg twice a day.  Her most recent GFR is improved at 40.  She has improved hydration.  I counseled her on goals of care her blood pressure should be closer to 120/82 protect transplanted kidney.  I advised her to monitor blood pressure at home in the mornings and also before bedtime.  If blood pressure trends are above goal she will contact transplant team for medication adjustments.   Mixed hyperlipidemia Assessment & Plan: Her most recent LDL is 76 goal would be less than 55 but she has a history of coronary artery disease.  She is currently on Zetia and simvastatin.  She will continue to work on nutrition.  Losing 10% of body weight may improve condition.  She is also to reduce saturated fats from her diet and continue current regimen.   Class 1 obesity with serious comorbidity and body mass index (BMI) of 33.0 to 33.9 in adult, unspecified obesity  type  Coronary artery disease involving native coronary artery of native heart without angina pectoris Assessment & Plan: Stable without any signs or symptoms of angina.  She is currently on antiplatelet therapy, statin, Zetia, beta-blocker.  She will continue current regimen and work on blood pressure control.       Return in about 2 weeks (around 05/31/2022) for For Weight Mangement with Dr. Gerarda Fraction - 40 minutes .Marland Kitchen She was informed of the importance of frequent follow up visits to maximize her success with intensive lifestyle modifications for her multiple health conditions.   ATTESTASTION STATEMENTS:  Reviewed by clinician on day of visit: allergies, medications, problem list, medical history, surgical history, family history, social history, and previous encounter notes.   Time spent on visit including pre-visit chart review and post-visit care and charting was 30 minutes.    Reeves Forth  Gerarda Fraction, MD

## 2022-06-01 ENCOUNTER — Ambulatory Visit (INDEPENDENT_AMBULATORY_CARE_PROVIDER_SITE_OTHER): Payer: Medicare Other | Admitting: Internal Medicine

## 2022-06-01 DIAGNOSIS — M3214 Glomerular disease in systemic lupus erythematosus: Secondary | ICD-10-CM | POA: Diagnosis not present

## 2022-06-01 DIAGNOSIS — I2510 Atherosclerotic heart disease of native coronary artery without angina pectoris: Secondary | ICD-10-CM | POA: Diagnosis not present

## 2022-06-01 DIAGNOSIS — D849 Immunodeficiency, unspecified: Secondary | ICD-10-CM | POA: Diagnosis not present

## 2022-06-01 DIAGNOSIS — Z940 Kidney transplant status: Secondary | ICD-10-CM | POA: Diagnosis not present

## 2022-06-01 DIAGNOSIS — N1832 Chronic kidney disease, stage 3b: Secondary | ICD-10-CM | POA: Diagnosis not present

## 2022-06-12 DIAGNOSIS — Z940 Kidney transplant status: Secondary | ICD-10-CM | POA: Diagnosis not present

## 2022-06-12 DIAGNOSIS — M810 Age-related osteoporosis without current pathological fracture: Secondary | ICD-10-CM | POA: Diagnosis not present

## 2022-06-15 ENCOUNTER — Ambulatory Visit (INDEPENDENT_AMBULATORY_CARE_PROVIDER_SITE_OTHER): Payer: Medicare Other | Admitting: Internal Medicine

## 2022-06-15 ENCOUNTER — Encounter (INDEPENDENT_AMBULATORY_CARE_PROVIDER_SITE_OTHER): Payer: Self-pay | Admitting: Internal Medicine

## 2022-06-15 VITALS — BP 118/71 | HR 65 | Temp 97.7°F | Ht 62.0 in | Wt 192.0 lb

## 2022-06-15 DIAGNOSIS — Z940 Kidney transplant status: Secondary | ICD-10-CM

## 2022-06-15 DIAGNOSIS — Z6835 Body mass index (BMI) 35.0-35.9, adult: Secondary | ICD-10-CM | POA: Diagnosis not present

## 2022-06-15 DIAGNOSIS — E669 Obesity, unspecified: Secondary | ICD-10-CM

## 2022-06-15 DIAGNOSIS — I10 Essential (primary) hypertension: Secondary | ICD-10-CM

## 2022-06-15 DIAGNOSIS — Z7969 Long term (current) use of other immunomodulators and immunosuppressants: Secondary | ICD-10-CM

## 2022-06-15 DIAGNOSIS — R7303 Prediabetes: Secondary | ICD-10-CM | POA: Diagnosis not present

## 2022-06-15 NOTE — Assessment & Plan Note (Signed)
Most recent A1c is 6.1 from January 2024.   Patient informed of disease state and risk of progression. This may contribute to abnormal cravings, fatigue and diabetes complications without having diabetes.   We reviewed treatment options which include weight loss of about 7 to 10% of body weight, increasing physical activity to 150 minutes a week of moderate intensity.  She may also be a candidate for pharmacoprophylaxis.  I will discuss options with nephrology because of transplant status.  She may benefit from medications to offset weight gain associated with immunosuppressants.

## 2022-06-15 NOTE — Progress Notes (Signed)
Office: [phone]  /  Fax: [phone]  WEIGHT SUMMARY AND BIOMETRICS  Vitals Temp: 97.7 F (36.5 C) BP: 118/71 Pulse Rate: 65 SpO2: 96 %   Anthropometric Measurements Height: '5\' 2"'$  (1.575 m) Weight: 192 lb (87.1 kg) BMI (Calculated): 35.11 Weight at Last Visit: 189 lb Weight Gained Since Last Visit: 3 Starting Weight: 187 lb Peak Weight: 212 lb   Body Composition  Body Fat %: 45.7 % Fat Mass (lbs): 87.7 lbs Muscle Mass (lbs): 99 lbs Total Body Water (lbs): 75.6 lbs Visceral Fat Rating : 13    HPI  Chief Complaint: OBESITY  Lindsey York is here to discuss her progress with her obesity treatment plan. She is on the the Category 1 Plan and states she is following her eating plan approximately 40-50 % of the time. She states she is exercising 20 minutes 4-5 times per week.  Interval History:  Since last office visit she has gained 3 lbs. She reports fair adherence to reduced calorie nutritional plan. She has been working on not skipping meals, drinking more water, avoiding and or reducing liquid calories, and making healthier choices '[]'$ Denies '[]'$ Reports problems with appetite and hunger signals.  '[]'$ Denies '[]'$ Reports problems with satiety and satiation.  '[]'$ Denies '[]'$ Reports problems with eating patterns and portion control.  '[]'$ Denies '[]'$ Reports abnormal cravings Sleeping approximately 5 hours a day.  Sleep described as: '[]'$ Restorative '[x]'$ Unrestorative '[x]'$ Interrupted Stress levels are reported as medium and due to External.  Barriers identified none, medical comorbidities, and other:GI issues related to medication .   Pharmacotherapy for weight loss: She is currently taking no anti-obesity medication.   Weight promoting medications identified: Steroids and Other: transplant meds .  ASSESSMENT AND PLAN  TREATMENT PLAN FOR OBESITY:  Recommended Dietary Goals  Berlie is currently in the action stage of change. As such, her goal is to continue weight management  plan. She has agreed to: the Category 1 Plan.  Behavioral Intervention  We discussed the following Behavioral Modification Strategies today: increasing lean protein intake, increasing vegetables, increasing water intake, and work on meal planning and easy cooking plans.  Additional resources provided today: None  Recommended Physical Activity Goals  Crimson has been advised to work up to 150 minutes of moderate intensity aerobic activity a week and strengthening exercises 2-3 times per week for cardiovascular health, weight loss maintenance and preservation of muscle mass.   She has agreed to :  Increase the intensity, frequency or duration of strengthening exercises  and Increase the intensity, frequency or duration of aerobic exercises    Pharmacotherapy We discussed various medication options to help [place] with her weight loss efforts and we both agreed to : continue with nutritional and behavioral strategies and discussed with nephrologist use of GLP-1 drugs  ASSOCIATED CONDITIONS ADDRESSED TODAY  Prediabetes Assessment & Plan: Most recent A1c is 6.1 from January 2024.   Patient informed of disease state and risk of progression. This may contribute to abnormal cravings, fatigue and diabetes complications without having diabetes.   We reviewed treatment options which include weight loss of about 7 to 10% of body weight, increasing physical activity to 150 minutes a week of moderate intensity.  She may also be a candidate for pharmacoprophylaxis.  I will discuss options with nephrology because of transplant status.  She may benefit from medications to offset weight gain associated with immunosuppressants.    Primary hypertension Assessment & Plan: Blood pressure control has improved.  She is currently on amlodipine 5 mg a day, carvedilol 12.5 without any  adverse effects.  Continue current regimen we will request records for most recent renal parameters   Renal transplant, status  post Assessment & Plan: On immunosuppressants.  These medications may contribute to weight gain.  We will discuss with nephrology regarding safety of GLP-1 drugs and transplantations with decreased GFR.   Hypertension, unspecified type Assessment & Plan: Blood pressure control has improved.  She is currently on amlodipine 5 mg a day, carvedilol 12.5 without any adverse effects.  Continue current regimen we will request records for most recent renal parameters      PHYSICAL EXAM:  Blood pressure 118/71, pulse 65, temperature 97.7 F (36.5 C), height '5\' 2"'$  (1.575 m), weight 192 lb (87.1 kg), SpO2 96 %. Body mass index is 35.12 kg/m.  General: She is overweight, cooperative, alert, well developed, and in no acute distress. PSYCH: Has normal mood, affect and thought process.   HEENT: EOMI, sclerae are anicteric. Lungs: Normal breathing effort, no conversational dyspnea. Extremities: No edema.  Neurologic: No gross sensory or motor deficits. No tremors or fasciculations noted.    DIAGNOSTIC DATA REVIEWED:  BMET    Component Value Date/Time   NA 144 11/06/2018 0910   K 4.8 11/06/2018 0910   CL 107 (H) 11/06/2018 0910   CO2 17 (L) 11/06/2018 0910   GLUCOSE 75 11/06/2018 0910   GLUCOSE 101 (H) 01/01/2018 0345   BUN 73 (H) 11/06/2018 0910   CREATININE 7.80 (H) 11/06/2018 0910   CALCIUM 8.3 (L) 11/06/2018 0910   GFRNONAA 5 (L) 11/06/2018 0910   GFRAA 6 (L) 11/06/2018 0910   Lab Results  Component Value Date   HGBA1C 5.4 08/29/2017   No results found for: "INSULIN" No results found for: "TSH" CBC    Component Value Date/Time   WBC 11.5 (H) 01/01/2018 0345   RBC 3.50 (L) 01/01/2018 0345   HGB 10.6 (L) 01/01/2018 0345   HGB 11.8 12/26/2017 1050   HCT 34.0 (L) 01/01/2018 0345   HCT 35.6 12/26/2017 1050   PLT 185 01/01/2018 0345   PLT 208 12/26/2017 1050   MCV 97.1 01/01/2018 0345   MCV 92 12/26/2017 1050   MCH 30.3 01/01/2018 0345   MCHC 31.2 01/01/2018 0345   RDW  16.4 (H) 01/01/2018 0345   RDW 17.2 (H) 12/26/2017 1050   Iron Studies No results found for: "IRON", "TIBC", "FERRITIN", "IRONPCTSAT" Lipid Panel     Component Value Date/Time   CHOL 141 11/06/2018 0910   TRIG 72 11/06/2018 0910   HDL 61 11/06/2018 0910   CHOLHDL 2.3 11/06/2018 0910   CHOLHDL 3.8 08/30/2017 0227   VLDL 22 08/30/2017 0227   LDLCALC 66 11/06/2018 0910   Hepatic Function Panel     Component Value Date/Time   PROT 6.9 11/06/2018 0910   ALBUMIN 4.3 11/06/2018 0910   AST 10 11/06/2018 0910   ALT 9 11/06/2018 0910   ALKPHOS 66 11/06/2018 0910   BILITOT 0.2 11/06/2018 0910   BILIDIR 0.09 11/06/2018 0910   No results found for: "TSH" Nutritional No results found for: "VD25OH"   Return in about 2 weeks (around 06/29/2022) for For Weight Mangement with Dr. Gerarda Fraction, 40 minutes Tranplant patient.. She was informed of the importance of frequent follow up visits to maximize her success with intensive lifestyle modifications for her multiple health conditions.   ATTESTASTION STATEMENTS:  Reviewed by clinician on day of visit: allergies, medications, problem list, medical history, surgical history, family history, social history, and previous encounter notes.   Time spent on  visit including pre-visit chart review and post-visit care and charting was 30 minutes.    Thomes Dinning, MD

## 2022-06-15 NOTE — Assessment & Plan Note (Signed)
On immunosuppressants.  These medications may contribute to weight gain.  We will discuss with nephrology regarding safety of GLP-1 drugs and transplantations with decreased GFR.

## 2022-06-15 NOTE — Assessment & Plan Note (Signed)
Blood pressure control has improved.  She is currently on amlodipine 5 mg a day, carvedilol 12.5 without any adverse effects.  Continue current regimen we will request records for most recent renal parameters

## 2022-07-04 ENCOUNTER — Ambulatory Visit (INDEPENDENT_AMBULATORY_CARE_PROVIDER_SITE_OTHER): Payer: Medicare Other | Admitting: Internal Medicine

## 2022-07-04 ENCOUNTER — Encounter (INDEPENDENT_AMBULATORY_CARE_PROVIDER_SITE_OTHER): Payer: Self-pay | Admitting: Internal Medicine

## 2022-07-04 VITALS — BP 109/75 | HR 66 | Temp 98.3°F | Ht 62.0 in | Wt 191.0 lb

## 2022-07-04 DIAGNOSIS — E669 Obesity, unspecified: Secondary | ICD-10-CM | POA: Diagnosis not present

## 2022-07-04 DIAGNOSIS — R7303 Prediabetes: Secondary | ICD-10-CM

## 2022-07-04 DIAGNOSIS — I2510 Atherosclerotic heart disease of native coronary artery without angina pectoris: Secondary | ICD-10-CM

## 2022-07-04 DIAGNOSIS — Z940 Kidney transplant status: Secondary | ICD-10-CM

## 2022-07-04 DIAGNOSIS — E66811 Obesity, class 1: Secondary | ICD-10-CM

## 2022-07-04 DIAGNOSIS — R140 Abdominal distension (gaseous): Secondary | ICD-10-CM | POA: Diagnosis not present

## 2022-07-04 DIAGNOSIS — Z6833 Body mass index (BMI) 33.0-33.9, adult: Secondary | ICD-10-CM

## 2022-07-04 NOTE — Progress Notes (Unsigned)
Office: [phone]  /  Fax: [phone]  WEIGHT SUMMARY AND BIOMETRICS  Vitals Temp: 98.3 F (36.8 C) BP: 109/75 Pulse Rate: 66 SpO2: 97 %   Anthropometric Measurements Height: 5\' 2"  (1.575 m) Weight: 191 lb (86.6 kg) BMI (Calculated): 34.93 Weight at Last Visit: 192 lb Starting Weight: 187 lb Total Weight Loss (lbs): 1 lb (0.454 kg) Peak Weight: 212 lb   Body Composition  Body Fat %: 45.9 % Fat Mass (lbs): 88 lbs Muscle Mass (lbs): 98.6 lbs Total Body Water (lbs): 76.6 lbs Visceral Fat Rating : 13    HPI  Chief Complaint: OBESITY  Lindsey York is here to discuss her progress with her obesity treatment plan. She is on the the Category 1 Plan and states she is following her eating plan approximately 35 % of the time. She states she is exercising 15-20 minutes 4-5 times per week.  Interval History:  Since last office visit she has lost 1 lb. Has gas and bloating. She reports {EMADHERENCE:28838::"good adherence to reduced calorie nutritional plan."} She has been working on {EMWORKINGON:29242} {ACTIONS;DENIES/REPORTS:21021675::"Denies"} problems with appetite and hunger signals.  {ACTIONS;DENIES/REPORTS:21021675::"Denies"} problems with satiety and satiation.  {ACTIONS;DENIES/REPORTS:21021675::"Denies"} problems with eating patterns and portion control.  {ACTIONS;DENIES/REPORTS:21021675::"Denies"} abnormal cravings. {ACTIONS;DENIES/REPORTS:21021675::"Denies"} feeling deprived or restricted.   Barriers identified: {EMOBESITYBARRIERS:28841::"none"}.   Pharmacotherapy for weight loss: She is currently taking {EMPharmaco:28845::"no anti-obesity medication"}.    ASSESSMENT AND PLAN  TREATMENT PLAN FOR OBESITY:  Recommended Dietary Goals  Alveta is currently in the action stage of change. As such, her goal is to continue weight management plan. She has agreed to: {EMWTLOSSPLAN:29297::"continue current plan"}  Behavioral Intervention  We discussed the following  Behavioral Modification Strategies today: {EMWMwtlossstrategies:28914}.  Additional resources provided today: {EMadditionalresources:29169::"None"}  Recommended Physical Activity Goals  Florentine has been advised to work up to 150 minutes of moderate intensity aerobic activity a week and strengthening exercises 2-3 times per week for cardiovascular health, weight loss maintenance and preservation of muscle mass.   She has agreed to :  {EMEXERCISE:28847::"Continue current level of physical activity "}  Pharmacotherapy We discussed various medication options to help Layken with her weight loss efforts and we both agreed to : {EMagreedrx:29170::"continue with nutritional and behavioral strategies"}  ASSOCIATED CONDITIONS ADDRESSED TODAY  There are no diagnoses linked to this encounter.   PHYSICAL EXAM:  Blood pressure 109/75, pulse 66, temperature 98.3 F (36.8 C), height 5\' 2"  (1.575 m), weight 191 lb (86.6 kg), SpO2 97 %. Body mass index is 34.93 kg/m.  General: She is overweight, cooperative, alert, well developed, and in no acute distress. PSYCH: Has normal mood, affect and thought process.   HEENT: EOMI, sclerae are anicteric. Lungs: Normal breathing effort, no conversational dyspnea. Extremities: No edema.  Neurologic: No gross sensory or motor deficits. No tremors or fasciculations noted.    DIAGNOSTIC DATA REVIEWED:  BMET    Component Value Date/Time   NA 144 11/06/2018 0910   K 4.8 11/06/2018 0910   CL 107 (H) 11/06/2018 0910   CO2 17 (L) 11/06/2018 0910   GLUCOSE 75 11/06/2018 0910   GLUCOSE 101 (H) 01/01/2018 0345   BUN 73 (H) 11/06/2018 0910   CREATININE 7.80 (H) 11/06/2018 0910   CALCIUM 8.3 (L) 11/06/2018 0910   GFRNONAA 5 (L) 11/06/2018 0910   GFRAA 6 (L) 11/06/2018 0910   Lab Results  Component Value Date   HGBA1C 5.4 08/29/2017   No results found for: "INSULIN" No results found for: "TSH" CBC    Component Value Date/Time  WBC 11.5 (H) 01/01/2018  0345   RBC 3.50 (L) 01/01/2018 0345   HGB 10.6 (L) 01/01/2018 0345   HGB 11.8 12/26/2017 1050   HCT 34.0 (L) 01/01/2018 0345   HCT 35.6 12/26/2017 1050   PLT 185 01/01/2018 0345   PLT 208 12/26/2017 1050   MCV 97.1 01/01/2018 0345   MCV 92 12/26/2017 1050   MCH 30.3 01/01/2018 0345   MCHC 31.2 01/01/2018 0345   RDW 16.4 (H) 01/01/2018 0345   RDW 17.2 (H) 12/26/2017 1050   Iron Studies No results found for: "IRON", "TIBC", "FERRITIN", "IRONPCTSAT" Lipid Panel     Component Value Date/Time   CHOL 141 11/06/2018 0910   TRIG 72 11/06/2018 0910   HDL 61 11/06/2018 0910   CHOLHDL 2.3 11/06/2018 0910   CHOLHDL 3.8 08/30/2017 0227   VLDL 22 08/30/2017 0227   LDLCALC 66 11/06/2018 0910   Hepatic Function Panel     Component Value Date/Time   PROT 6.9 11/06/2018 0910   ALBUMIN 4.3 11/06/2018 0910   AST 10 11/06/2018 0910   ALT 9 11/06/2018 0910   ALKPHOS 66 11/06/2018 0910   BILITOT 0.2 11/06/2018 0910   BILIDIR 0.09 11/06/2018 0910   No results found for: "TSH" Nutritional No results found for: "VD25OH"   No follow-ups on file.Marland Kitchen She was informed of the importance of frequent follow up visits to maximize her success with intensive lifestyle modifications for her multiple health conditions.   ATTESTASTION STATEMENTS:  Reviewed by clinician on day of visit: allergies, medications, problem list, medical history, surgical history, family history, social history, and previous encounter notes.     Thomes Dinning, MD

## 2022-07-05 NOTE — Assessment & Plan Note (Signed)
Affecting treatment options and decision making.

## 2022-07-05 NOTE — Assessment & Plan Note (Signed)
Stable without any signs or symptoms of angina.  She is currently on antiplatelet therapy, statin, Zetia, beta-blocker.  Blood pressure is well-controlled her LDL cholesterol is also at goal.  She may benefit from incretin therapy also from a cardiovascular standpoint

## 2022-07-05 NOTE — Assessment & Plan Note (Signed)
Patient counseled on etiologies, FODMAP elimination diet.  She will exclude certain sugars 1 week at a time and keep a journal of her symptoms.

## 2022-07-05 NOTE — Assessment & Plan Note (Signed)
Most recent A1c is 6.1 from January 2024.   Patient informed of disease state and risk of progression. This may contribute to abnormal cravings, fatigue and diabetes complications without having diabetes.   We reviewed treatment options which include weight loss of about 7 to 10% of body weight, increasing physical activity to 150 minutes a week of moderate intensity.  She may also be a candidate for pharmacoprophylaxis.  I will discuss options with nephrology because of transplant status.  She may benefit from medications to offset weight gain associated with immunosuppressants.  

## 2022-07-05 NOTE — Assessment & Plan Note (Signed)
Some of her weight gain is an partly iatrogenic due to weight promoting medications.  We will look into incretin therapy.

## 2022-07-12 DIAGNOSIS — M810 Age-related osteoporosis without current pathological fracture: Secondary | ICD-10-CM | POA: Diagnosis not present

## 2022-07-12 DIAGNOSIS — Z940 Kidney transplant status: Secondary | ICD-10-CM | POA: Diagnosis not present

## 2022-07-26 ENCOUNTER — Encounter (INDEPENDENT_AMBULATORY_CARE_PROVIDER_SITE_OTHER): Payer: Self-pay | Admitting: Family Medicine

## 2022-07-26 ENCOUNTER — Telehealth (INDEPENDENT_AMBULATORY_CARE_PROVIDER_SITE_OTHER): Payer: Medicare Other | Admitting: Family Medicine

## 2022-07-26 DIAGNOSIS — Z940 Kidney transplant status: Secondary | ICD-10-CM | POA: Diagnosis not present

## 2022-07-26 DIAGNOSIS — E669 Obesity, unspecified: Secondary | ICD-10-CM | POA: Diagnosis not present

## 2022-07-26 DIAGNOSIS — M329 Systemic lupus erythematosus, unspecified: Secondary | ICD-10-CM

## 2022-07-26 DIAGNOSIS — I2510 Atherosclerotic heart disease of native coronary artery without angina pectoris: Secondary | ICD-10-CM

## 2022-07-26 DIAGNOSIS — R140 Abdominal distension (gaseous): Secondary | ICD-10-CM | POA: Diagnosis not present

## 2022-07-26 DIAGNOSIS — Z6834 Body mass index (BMI) 34.0-34.9, adult: Secondary | ICD-10-CM | POA: Diagnosis not present

## 2022-07-26 DIAGNOSIS — R7303 Prediabetes: Secondary | ICD-10-CM

## 2022-07-26 NOTE — Progress Notes (Signed)
TeleHealth Visit:  This visit was completed with telemedicine (audio/video) technology. Carole has verbally consented to this TeleHealth visit. The patient is located at home, the provider is located at home. The participants in this visit include the listed provider and patient. The visit was conducted today via phone call.   The patient was unable to connect to video. Length of call was 30 minutes.   OBESITY Naleigha is here to discuss her progress with her obesity treatment plan along with follow-up of her obesity related diagnoses.   Today's visit was # 5 Starting weight: 187 lbs Starting date: 05/03/22 Weight at last in office visit: 191 lbs on 07/04/22 Total weight loss: 0 lbs at last in office visit on 07/04/22. Today's reported weight (07/26/22): none reported  Nutrition Plan: the Category 1 plan  Current exercise:  stationary bike 25 minutes 3-4 times per week   Interim History:  Dresden had a kidney transplant in October 2022. This impacts some of our treatment options. She has been working on increasing water intake and is drinking about 48 oz of water. She drinks some half cut sweet tea and an occasional serving of V8 tropical. She asked about journaling her intake and wants to know what her allotted calories and protein would be  Pharmacotherapy: None Assessment/Plan:  1. Bloating Reports recent bloating and is unsure of the cause.  Upon further discussion she feels it may have been caused by lettuce that she had this morning.  She forgot to take her Beano.  Plan: Discussed bloating can often be caused by simple carbohydrates, certain vegetables, dairy.  Encouraged her to pay attention to food intake when she is experiencing bloating in order to decide what foods are causing it.  2.  Renal transplant, status post She had a kidney transplant in October 2023.  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 She sees Dr. Aura Camps (nephrology) at  [facility]. She says she has been on prednisone constantly since the transplant which she feels caused most of her weight gain. She takes prednisone 5 mg daily.  Plan: Continue to follow-up with nephrology as directed. Set protein goal low at 60 g/day. Asked her to ask Dr.Daeihagh at her next appointment in June if he felt that 70 g daily would be permissible. Protein goal set at 60 g daily for now.  3. Generalized Obesity: Current BMI 34 Cailynn is currently in the action stage of change. As such, her goal is to continue with weight loss efforts.  She has agreed to keeping a food journal with goal of 1000-1100 calories and 60 grams of protein daily.  1.  Smart fruit handout sent via email.  Patient is having trouble with her MyChart account. 2.  Discussed which app to use for journaling.  Advised her she can use any app that calculates her protein and calories but may find that using an app with a barcode scanner will make journaling more convenient.  Encouraged her to try the lose it app. 3.  Cut out sweet tea.  Drink unsweetened tea sweetened with Stevia. 4.  Increase water to 64 ounces per day.  Exercise goals: Increase stationary bike to 30 minutes 3-4 days/week.  Add in exercises using hand weights twice weekly.  Behavioral modification strategies: decreasing simple carbohydrates , meal planning , decrease liquid calories, and planning for success.  Aeon has agreed to follow-up with our clinic in 4 weeks.   No orders of the defined types were placed in  this encounter.   There are no discontinued medications.   No orders of the defined types were placed in this encounter.     Objective:   VITALS: Per patient if applicable, see vitals. GENERAL: Alert and in no acute distress. CARDIOPULMONARY: No increased WOB. Speaking in clear sentences.  PSYCH: Pleasant and cooperative. Speech normal rate and rhythm. Affect is appropriate. Insight and judgement are  appropriate. Attention is focused, linear, and appropriate.  NEURO: Oriented as arrived to appointment on time with no prompting.   Attestation Statements:   Reviewed by clinician on day of visit: allergies, medications, problem list, medical history, surgical history, family history, social history, and previous encounter notes.   This was prepared with the assistance of Engineer, civil (consulting).  Occasional wrong-word or sound-a-like substitutions may have occurred due to the inherent limitations of voice recognition software.

## 2022-08-01 ENCOUNTER — Ambulatory Visit: Payer: Medicare Other | Admitting: General Practice

## 2022-08-08 DIAGNOSIS — J069 Acute upper respiratory infection, unspecified: Secondary | ICD-10-CM | POA: Diagnosis not present

## 2022-08-08 DIAGNOSIS — Z20822 Contact with and (suspected) exposure to covid-19: Secondary | ICD-10-CM | POA: Diagnosis not present

## 2022-08-16 DIAGNOSIS — Z940 Kidney transplant status: Secondary | ICD-10-CM | POA: Diagnosis not present

## 2022-08-23 DIAGNOSIS — H2513 Age-related nuclear cataract, bilateral: Secondary | ICD-10-CM | POA: Diagnosis not present

## 2022-08-23 DIAGNOSIS — H35713 Central serous chorioretinopathy, bilateral: Secondary | ICD-10-CM | POA: Diagnosis not present

## 2022-08-23 DIAGNOSIS — H353131 Nonexudative age-related macular degeneration, bilateral, early dry stage: Secondary | ICD-10-CM | POA: Diagnosis not present

## 2022-08-24 ENCOUNTER — Ambulatory Visit (INDEPENDENT_AMBULATORY_CARE_PROVIDER_SITE_OTHER): Payer: Medicare Other | Admitting: Internal Medicine

## 2022-09-05 ENCOUNTER — Ambulatory Visit: Payer: Medicare Other | Attending: Cardiovascular Disease | Admitting: Cardiovascular Disease

## 2022-09-05 ENCOUNTER — Encounter: Payer: Self-pay | Admitting: Cardiovascular Disease

## 2022-09-05 VITALS — BP 128/74 | HR 65 | Ht 63.0 in | Wt 191.0 lb

## 2022-09-05 DIAGNOSIS — E782 Mixed hyperlipidemia: Secondary | ICD-10-CM | POA: Diagnosis not present

## 2022-09-05 DIAGNOSIS — I2510 Atherosclerotic heart disease of native coronary artery without angina pectoris: Secondary | ICD-10-CM

## 2022-09-05 DIAGNOSIS — I10 Essential (primary) hypertension: Secondary | ICD-10-CM | POA: Diagnosis not present

## 2022-09-05 DIAGNOSIS — I214 Non-ST elevation (NSTEMI) myocardial infarction: Secondary | ICD-10-CM | POA: Diagnosis not present

## 2022-09-05 NOTE — Assessment & Plan Note (Signed)
History of hyperlipidemia on simvastatin and Zetia with a lipid profile performed in January 2024 revealing total cholesterol 144, LDL 76 and HDL 41.

## 2022-09-05 NOTE — Assessment & Plan Note (Signed)
History of essential hypertension blood pressure measured today 128/74.  She is on low-dose amlodipine and carvedilol.

## 2022-09-05 NOTE — Patient Instructions (Signed)
    Follow-Up: At [facility], you and your health needs are our priority.  As part of our continuing mission to provide you with exceptional heart care, we have created designated Provider Care Teams.  These Care Teams include your primary Cardiologist (physician) and Advanced Practice Providers (APPs -  Physician Assistants and Nurse Practitioners) who all work together to provide you with the care you need, when you need it.  We recommend signing up for the patient portal called "MyChart".  Sign up information is provided on this After Visit Summary.  MyChart is used to connect with patients for Virtual Visits (Telemedicine).  Patients are able to view lab/test results, encounter notes, upcoming appointments, etc.  Non-urgent messages can be sent to your provider as well.   To learn more about what you can do with MyChart, go to [email or web address].    Your next appointment:   12 month(s)  Provider:   Nanetta Batty, MD

## 2022-09-05 NOTE — Progress Notes (Signed)
09/05/2022 Lindsey York   08-06-1965  [id number]  Primary Physician Patient, No Pcp Per Primary Cardiologist: Runell Gess MD Milagros Loll, [place], [place]  HPI:  Lindsey York is a 57 y.o.    moderately overweight divorced African-American female mother of 2 children who I last saw in the office 09/22/2021.  She has a history of systemic lupus erythematosus, CKD 4 with serum creatinine is in the 5-6 range predialysis previously on the transplant list at [facility].  She also has history of treated hypertension hyperlipidemia.  She currently does not work.  She has never smoked.  She had a non-STEMI 08/26/2017 and catheterization performed by Dr. Clifton James via the right femoral approach revealing a subtotally occluded first marginal branch which was stented with a synergy drug-eluting stent (2.25 mm x 20 mill meters).  She did have an 80% fairly focal mid dominant RCA stenosis which was not intervened on because of her renal insufficiency.  She is otherwise asymptomatic but wishes to continue on the renal transplant list which cannot occur until her RCA is intervened on.   I performed RCA PCI and drug-eluting stenting electively as an outpatient 12/31/2017 with a synergy drug-eluting stent via the right femoral approach.  She has done well since and remains on dual antiplatelet therapy.  I did use only 25 cc of contrast and this did not affect her renal function.   She did start hemodialysis in March of 2021 and is doing well with that.  She had some bleeding from her fistula site and stopped her aspirin.  We did stop her Brilinta last year because of bleeding from her dialysis site.  Since I saw her a year ago she continues to do well.  She did end up having a renal transplant at [facility] 01/31/2021 and has been doing well since.  She is no longer on dialysis.  She denies chest pain but does have a little bit of dyspnea on although she has gained 30 pounds since  I last saw her.   Current Meds  Medication Sig   acetaminophen (TYLENOL) 325 MG tablet Take 650 mg by mouth every 6 (six) hours as needed for moderate pain.   acyclovir ointment (ZOVIRAX) 5 % Apply twice a day to affected area as needed   amLODipine (NORVASC) 5 MG tablet Take 2.5 mg by mouth daily.   aspirin EC 81 MG tablet Take 1 tablet (81 mg total) by mouth daily. Swallow whole.   bevacizumab (AVASTIN) 400 MG/16ML SOLN Inject 1.25 mg as directed every 30 (thirty) days. Pt takes as needed.   carvedilol (COREG) 12.5 MG tablet Take 12.5 mg by mouth 2 (two) times daily with a meal. Patient also take 6.25 mg twice daily   ENVARSUS XR 1 MG TB24 Take 1 mg by mouth daily. 2 tablet daily   ezetimibe (ZETIA) 10 MG tablet TAKE 1 TABLET(10 MG) BY MOUTH DAILY.   famotidine (PEPCID) 20 MG tablet Take 20 mg by mouth daily.   mycophenolate (MYFORTIC) 180 MG EC tablet Take by mouth.   predniSONE (DELTASONE) 5 MG tablet Take 5 mg by mouth daily.   simvastatin (ZOCOR) 20 MG tablet TAKE 1 TABLET(20 MG) BY MOUTH AT BEDTIME   sucralfate (CARAFATE) 1 g tablet Take 1 g by mouth 2 (two) times daily.   valGANciclovir (VALCYTE) 450 MG tablet Take 450 mg by mouth daily.     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  Tobacco Use   Smoking status: Never   Smokeless tobacco: Never  Vaping Use   Vaping Use: Never used  Substance and Sexual Activity   Alcohol use: Never   Drug use: Never   Sexual activity: Yes  Other Topics Concern   Not on file  Social History Narrative   Not on file   Social Determinants of Health   Financial Resource Strain: Not on file  Food  Insecurity: Not on file  Transportation Needs: Not on file  Physical Activity: Not on file  Stress: Stress Concern Present (02/28/2022)   [employer] of Occupational Health - Occupational Stress Questionnaire    Feeling of Stress : To some extent  Social Connections: Not on file  Intimate Partner Violence: Not on file     Review of Systems: General: negative for chills, fever, night sweats or weight changes.  Cardiovascular: negative for chest pain, dyspnea on exertion, edema, orthopnea, palpitations, paroxysmal nocturnal dyspnea or shortness of breath Dermatological: negative for rash Respiratory: negative for cough or wheezing Urologic: negative for hematuria Abdominal: negative for nausea, vomiting, diarrhea, bright red blood per rectum, melena, or hematemesis Neurologic: negative for visual changes, syncope, or dizziness All other systems reviewed and are otherwise negative except as noted above.    Blood pressure 128/74, pulse 65, height 5\' 3"  (1.6 m), weight 191 lb (86.6 kg), SpO2 95 %.  General appearance: alert and no distress Neck: no adenopathy, no carotid bruit, no JVD, supple, symmetrical, trachea midline, and thyroid not enlarged, symmetric, no tenderness/mass/nodules Lungs: clear to auscultation bilaterally Heart: Soft outflow tract murmur Extremities: extremities normal, atraumatic, no cyanosis or edema Pulses: 2+ and symmetric Skin: Skin color, texture, turgor normal. No rashes or lesions Neurologic: Grossly normal  EKG sinus rhythm at 65 with anterolateral T wave inversion.  These appear to be new since her prior tracings.  I personally reviewed this EKG.  ASSESSMENT AND PLAN:   NSTEMI (non-ST elevated myocardial infarction) (HCC) History of non-STEMI 08/26/2017 treated with PCI and drug-eluting stenting with subtotally occluded first marginal branch by Dr. Clifton James using a 2.25 mm x 20 mm long Synergy drug-eluting stent.  Because of renal insufficiency I  staged her RCA intervention for 12/31/2017.  She is completely asymptomatic.  Hypertension History of essential hypertension blood pressure measured today 128/74.  She is on low-dose amlodipine and carvedilol.  Hyperlipidemia History of hyperlipidemia on simvastatin and Zetia with a lipid profile performed in January 2024 revealing total cholesterol 144, LDL 76 and HDL 41.     Runell Gess MD FACP,FACC,FAHA, [facility] 09/05/2022 10:25 AM

## 2022-09-05 NOTE — Assessment & Plan Note (Signed)
History of non-STEMI 08/26/2017 treated with PCI and drug-eluting stenting with subtotally occluded first marginal branch by Dr. Clifton James using a 2.25 mm x 20 mm long Synergy drug-eluting stent.  Because of renal insufficiency I staged her RCA intervention for 12/31/2017.  She is completely asymptomatic.

## 2022-09-06 DIAGNOSIS — Z940 Kidney transplant status: Secondary | ICD-10-CM | POA: Diagnosis not present

## 2022-09-07 DIAGNOSIS — Z940 Kidney transplant status: Secondary | ICD-10-CM | POA: Diagnosis not present

## 2022-09-11 DIAGNOSIS — Z940 Kidney transplant status: Secondary | ICD-10-CM | POA: Diagnosis not present

## 2022-09-12 DIAGNOSIS — R8290 Unspecified abnormal findings in urine: Secondary | ICD-10-CM | POA: Diagnosis not present

## 2022-09-14 DIAGNOSIS — R198 Other specified symptoms and signs involving the digestive system and abdomen: Secondary | ICD-10-CM | POA: Diagnosis not present

## 2022-09-14 DIAGNOSIS — R140 Abdominal distension (gaseous): Secondary | ICD-10-CM | POA: Diagnosis not present

## 2022-09-14 DIAGNOSIS — K219 Gastro-esophageal reflux disease without esophagitis: Secondary | ICD-10-CM | POA: Diagnosis not present

## 2022-09-20 DIAGNOSIS — Z940 Kidney transplant status: Secondary | ICD-10-CM | POA: Diagnosis not present

## 2022-09-20 DIAGNOSIS — Z7960 Long term (current) use of unspecified immunomodulators and immunosuppressants: Secondary | ICD-10-CM | POA: Diagnosis not present

## 2022-10-04 DIAGNOSIS — Z940 Kidney transplant status: Secondary | ICD-10-CM | POA: Diagnosis not present

## 2022-10-04 DIAGNOSIS — Z7960 Long term (current) use of unspecified immunomodulators and immunosuppressants: Secondary | ICD-10-CM | POA: Diagnosis not present

## 2022-10-11 DIAGNOSIS — Z940 Kidney transplant status: Secondary | ICD-10-CM | POA: Diagnosis not present

## 2022-10-11 DIAGNOSIS — Z7960 Long term (current) use of unspecified immunomodulators and immunosuppressants: Secondary | ICD-10-CM | POA: Diagnosis not present

## 2022-10-19 DIAGNOSIS — Z7952 Long term (current) use of systemic steroids: Secondary | ICD-10-CM | POA: Diagnosis not present

## 2022-10-19 DIAGNOSIS — M109 Gout, unspecified: Secondary | ICD-10-CM | POA: Diagnosis not present

## 2022-10-19 DIAGNOSIS — M329 Systemic lupus erythematosus, unspecified: Secondary | ICD-10-CM | POA: Diagnosis not present

## 2022-10-19 DIAGNOSIS — M7050 Other bursitis of knee, unspecified knee: Secondary | ICD-10-CM | POA: Diagnosis not present

## 2022-10-19 DIAGNOSIS — M810 Age-related osteoporosis without current pathological fracture: Secondary | ICD-10-CM | POA: Diagnosis not present

## 2022-10-19 DIAGNOSIS — M7051 Other bursitis of knee, right knee: Secondary | ICD-10-CM | POA: Diagnosis not present

## 2022-11-01 DIAGNOSIS — M329 Systemic lupus erythematosus, unspecified: Secondary | ICD-10-CM | POA: Diagnosis not present

## 2022-11-01 DIAGNOSIS — Z7960 Long term (current) use of unspecified immunomodulators and immunosuppressants: Secondary | ICD-10-CM | POA: Diagnosis not present

## 2022-11-01 DIAGNOSIS — Z940 Kidney transplant status: Secondary | ICD-10-CM | POA: Diagnosis not present

## 2022-11-08 DIAGNOSIS — N3281 Overactive bladder: Secondary | ICD-10-CM | POA: Diagnosis not present

## 2022-11-09 DIAGNOSIS — Z940 Kidney transplant status: Secondary | ICD-10-CM | POA: Diagnosis not present

## 2022-11-09 DIAGNOSIS — R809 Proteinuria, unspecified: Secondary | ICD-10-CM | POA: Diagnosis not present

## 2022-11-09 DIAGNOSIS — N261 Atrophy of kidney (terminal): Secondary | ICD-10-CM | POA: Diagnosis not present

## 2022-11-09 DIAGNOSIS — R319 Hematuria, unspecified: Secondary | ICD-10-CM | POA: Diagnosis not present

## 2022-12-05 DIAGNOSIS — Z7960 Long term (current) use of unspecified immunomodulators and immunosuppressants: Secondary | ICD-10-CM | POA: Diagnosis not present

## 2022-12-05 DIAGNOSIS — Z940 Kidney transplant status: Secondary | ICD-10-CM | POA: Diagnosis not present

## 2022-12-06 DIAGNOSIS — I10 Essential (primary) hypertension: Secondary | ICD-10-CM | POA: Diagnosis not present

## 2022-12-06 DIAGNOSIS — Z713 Dietary counseling and surveillance: Secondary | ICD-10-CM | POA: Diagnosis not present

## 2022-12-06 DIAGNOSIS — I2510 Atherosclerotic heart disease of native coronary artery without angina pectoris: Secondary | ICD-10-CM | POA: Diagnosis not present

## 2022-12-06 DIAGNOSIS — Z13228 Encounter for screening for other metabolic disorders: Secondary | ICD-10-CM | POA: Diagnosis not present

## 2022-12-06 DIAGNOSIS — E785 Hyperlipidemia, unspecified: Secondary | ICD-10-CM | POA: Diagnosis not present

## 2022-12-06 DIAGNOSIS — N185 Chronic kidney disease, stage 5: Secondary | ICD-10-CM | POA: Diagnosis not present

## 2022-12-06 DIAGNOSIS — I214 Non-ST elevation (NSTEMI) myocardial infarction: Secondary | ICD-10-CM | POA: Diagnosis not present

## 2022-12-06 DIAGNOSIS — Z79899 Other long term (current) drug therapy: Secondary | ICD-10-CM | POA: Diagnosis not present

## 2022-12-06 DIAGNOSIS — Z7182 Exercise counseling: Secondary | ICD-10-CM | POA: Diagnosis not present

## 2022-12-20 DIAGNOSIS — H2513 Age-related nuclear cataract, bilateral: Secondary | ICD-10-CM | POA: Diagnosis not present

## 2022-12-20 DIAGNOSIS — H35713 Central serous chorioretinopathy, bilateral: Secondary | ICD-10-CM | POA: Diagnosis not present

## 2022-12-20 DIAGNOSIS — H353131 Nonexudative age-related macular degeneration, bilateral, early dry stage: Secondary | ICD-10-CM | POA: Diagnosis not present

## 2022-12-21 DIAGNOSIS — Z79899 Other long term (current) drug therapy: Secondary | ICD-10-CM | POA: Diagnosis not present

## 2022-12-21 DIAGNOSIS — Z713 Dietary counseling and surveillance: Secondary | ICD-10-CM | POA: Diagnosis not present

## 2022-12-21 DIAGNOSIS — I214 Non-ST elevation (NSTEMI) myocardial infarction: Secondary | ICD-10-CM | POA: Diagnosis not present

## 2022-12-21 DIAGNOSIS — N185 Chronic kidney disease, stage 5: Secondary | ICD-10-CM | POA: Diagnosis not present

## 2023-01-02 DIAGNOSIS — Z940 Kidney transplant status: Secondary | ICD-10-CM | POA: Diagnosis not present

## 2023-01-08 ENCOUNTER — Other Ambulatory Visit: Payer: Self-pay | Admitting: Cardiovascular Disease

## 2023-01-11 DIAGNOSIS — Z713 Dietary counseling and surveillance: Secondary | ICD-10-CM | POA: Diagnosis not present

## 2023-01-11 DIAGNOSIS — N185 Chronic kidney disease, stage 5: Secondary | ICD-10-CM | POA: Diagnosis not present

## 2023-01-16 DIAGNOSIS — Z940 Kidney transplant status: Secondary | ICD-10-CM | POA: Diagnosis not present

## 2023-01-16 DIAGNOSIS — R350 Frequency of micturition: Secondary | ICD-10-CM | POA: Diagnosis not present

## 2023-02-02 DIAGNOSIS — Z940 Kidney transplant status: Secondary | ICD-10-CM | POA: Diagnosis not present

## 2023-02-02 DIAGNOSIS — Z7960 Long term (current) use of unspecified immunomodulators and immunosuppressants: Secondary | ICD-10-CM | POA: Diagnosis not present

## 2023-02-05 DIAGNOSIS — R140 Abdominal distension (gaseous): Secondary | ICD-10-CM | POA: Diagnosis not present

## 2023-02-07 DIAGNOSIS — R140 Abdominal distension (gaseous): Secondary | ICD-10-CM | POA: Diagnosis not present

## 2023-02-07 DIAGNOSIS — R198 Other specified symptoms and signs involving the digestive system and abdomen: Secondary | ICD-10-CM | POA: Diagnosis not present

## 2023-02-07 DIAGNOSIS — R1084 Generalized abdominal pain: Secondary | ICD-10-CM | POA: Diagnosis not present

## 2023-02-08 DIAGNOSIS — Z713 Dietary counseling and surveillance: Secondary | ICD-10-CM | POA: Diagnosis not present

## 2023-02-08 DIAGNOSIS — N185 Chronic kidney disease, stage 5: Secondary | ICD-10-CM | POA: Diagnosis not present

## 2023-02-25 DIAGNOSIS — M79642 Pain in left hand: Secondary | ICD-10-CM | POA: Diagnosis not present

## 2023-02-25 DIAGNOSIS — W19XXXA Unspecified fall, initial encounter: Secondary | ICD-10-CM | POA: Diagnosis not present

## 2023-02-25 DIAGNOSIS — H5712 Ocular pain, left eye: Secondary | ICD-10-CM | POA: Diagnosis not present

## 2023-02-25 DIAGNOSIS — R220 Localized swelling, mass and lump, head: Secondary | ICD-10-CM | POA: Diagnosis not present

## 2023-03-06 DIAGNOSIS — R92323 Mammographic fibroglandular density, bilateral breasts: Secondary | ICD-10-CM | POA: Diagnosis not present

## 2023-03-06 DIAGNOSIS — Z1231 Encounter for screening mammogram for malignant neoplasm of breast: Secondary | ICD-10-CM | POA: Diagnosis not present

## 2023-03-07 DIAGNOSIS — Z940 Kidney transplant status: Secondary | ICD-10-CM | POA: Diagnosis not present

## 2023-03-07 DIAGNOSIS — Z7960 Long term (current) use of unspecified immunomodulators and immunosuppressants: Secondary | ICD-10-CM | POA: Diagnosis not present

## 2023-03-08 DIAGNOSIS — D849 Immunodeficiency, unspecified: Secondary | ICD-10-CM | POA: Diagnosis not present

## 2023-03-08 DIAGNOSIS — M329 Systemic lupus erythematosus, unspecified: Secondary | ICD-10-CM | POA: Diagnosis not present

## 2023-03-08 DIAGNOSIS — Z940 Kidney transplant status: Secondary | ICD-10-CM | POA: Diagnosis not present

## 2023-03-20 DIAGNOSIS — R140 Abdominal distension (gaseous): Secondary | ICD-10-CM | POA: Diagnosis not present

## 2023-04-06 ENCOUNTER — Other Ambulatory Visit: Payer: Self-pay | Admitting: Cardiovascular Disease

## 2023-04-06 DIAGNOSIS — Z7960 Long term (current) use of unspecified immunomodulators and immunosuppressants: Secondary | ICD-10-CM | POA: Diagnosis not present

## 2023-04-06 DIAGNOSIS — Z940 Kidney transplant status: Secondary | ICD-10-CM | POA: Diagnosis not present

## 2023-04-06 DIAGNOSIS — R809 Proteinuria, unspecified: Secondary | ICD-10-CM | POA: Diagnosis not present

## 2023-04-14 ENCOUNTER — Emergency Department (HOSPITAL_BASED_OUTPATIENT_CLINIC_OR_DEPARTMENT_OTHER): Payer: Medicare Other

## 2023-04-14 ENCOUNTER — Encounter (HOSPITAL_BASED_OUTPATIENT_CLINIC_OR_DEPARTMENT_OTHER): Payer: Self-pay | Admitting: Emergency Medicine

## 2023-04-14 ENCOUNTER — Emergency Department (HOSPITAL_BASED_OUTPATIENT_CLINIC_OR_DEPARTMENT_OTHER)
Admission: EM | Admit: 2023-04-14 | Discharge: 2023-04-14 | Disposition: A | Discharge status: Home / Self Care | Payer: Medicare Other | Attending: Emergency Medicine | Admitting: Emergency Medicine

## 2023-04-14 ENCOUNTER — Other Ambulatory Visit: Payer: Self-pay

## 2023-04-14 DIAGNOSIS — N184 Chronic kidney disease, stage 4 (severe): Secondary | ICD-10-CM | POA: Insufficient documentation

## 2023-04-14 DIAGNOSIS — R918 Other nonspecific abnormal finding of lung field: Secondary | ICD-10-CM | POA: Diagnosis not present

## 2023-04-14 DIAGNOSIS — R058 Other specified cough: Secondary | ICD-10-CM | POA: Diagnosis not present

## 2023-04-14 DIAGNOSIS — R531 Weakness: Secondary | ICD-10-CM | POA: Diagnosis not present

## 2023-04-14 DIAGNOSIS — Z743 Need for continuous supervision: Secondary | ICD-10-CM | POA: Diagnosis not present

## 2023-04-14 DIAGNOSIS — R059 Cough, unspecified: Secondary | ICD-10-CM | POA: Diagnosis present

## 2023-04-14 DIAGNOSIS — R109 Unspecified abdominal pain: Secondary | ICD-10-CM | POA: Diagnosis not present

## 2023-04-14 DIAGNOSIS — R110 Nausea: Secondary | ICD-10-CM | POA: Diagnosis not present

## 2023-04-14 DIAGNOSIS — R197 Diarrhea, unspecified: Secondary | ICD-10-CM | POA: Diagnosis not present

## 2023-04-14 DIAGNOSIS — J189 Pneumonia, unspecified organism: Secondary | ICD-10-CM

## 2023-04-14 DIAGNOSIS — J101 Influenza due to other identified influenza virus with other respiratory manifestations: Secondary | ICD-10-CM | POA: Insufficient documentation

## 2023-04-14 DIAGNOSIS — J181 Lobar pneumonia, unspecified organism: Secondary | ICD-10-CM | POA: Insufficient documentation

## 2023-04-14 DIAGNOSIS — Z20822 Contact with and (suspected) exposure to covid-19: Secondary | ICD-10-CM | POA: Insufficient documentation

## 2023-04-14 DIAGNOSIS — Z7982 Long term (current) use of aspirin: Secondary | ICD-10-CM | POA: Insufficient documentation

## 2023-04-14 LAB — CBC WITH DIFFERENTIAL/PLATELET
Abs Immature Granulocytes: 0.08 10*3/uL — ABNORMAL HIGH (ref 0.00–0.07)
Basophils Absolute: 0 10*3/uL (ref 0.0–0.1)
Basophils Relative: 0 %
Eosinophils Absolute: 0.1 10*3/uL (ref 0.0–0.5)
Eosinophils Relative: 1 %
HCT: 37.5 % (ref 36.0–46.0)
Hemoglobin: 12.2 g/dL (ref 12.0–15.0)
Immature Granulocytes: 1 %
Lymphocytes Relative: 9 %
Lymphs Abs: 0.7 10*3/uL (ref 0.7–4.0)
MCH: 32.1 pg (ref 26.0–34.0)
MCHC: 32.5 g/dL (ref 30.0–36.0)
MCV: 98.7 fL (ref 80.0–100.0)
Monocytes Absolute: 1.1 10*3/uL — ABNORMAL HIGH (ref 0.1–1.0)
Monocytes Relative: 15 %
Neutro Abs: 5.5 10*3/uL (ref 1.7–7.7)
Neutrophils Relative %: 74 %
Platelets: 112 10*3/uL — ABNORMAL LOW (ref 150–400)
RBC: 3.8 MIL/uL — ABNORMAL LOW (ref 3.87–5.11)
RDW: 14.2 % (ref 11.5–15.5)
WBC: 7.5 10*3/uL (ref 4.0–10.5)
nRBC: 0 % (ref 0.0–0.2)

## 2023-04-14 LAB — COMPREHENSIVE METABOLIC PANEL
ALT: 15 U/L (ref 0–44)
AST: 18 U/L (ref 15–41)
Albumin: 3.2 g/dL — ABNORMAL LOW (ref 3.5–5.0)
Alkaline Phosphatase: 82 U/L (ref 38–126)
Anion gap: 8 (ref 5–15)
BUN: 27 mg/dL — ABNORMAL HIGH (ref 6–20)
CO2: 22 mmol/L (ref 22–32)
Calcium: 8.1 mg/dL — ABNORMAL LOW (ref 8.9–10.3)
Chloride: 106 mmol/L (ref 98–111)
Creatinine, Ser: 1.77 mg/dL — ABNORMAL HIGH (ref 0.44–1.00)
GFR, Estimated: 33 mL/min — ABNORMAL LOW (ref >60)
Glucose, Bld: 116 mg/dL — ABNORMAL HIGH (ref 70–99)
Potassium: 3.4 mmol/L — ABNORMAL LOW (ref 3.5–5.1)
Sodium: 136 mmol/L (ref 135–145)
Total Bilirubin: 0.8 mg/dL (ref 0.0–1.2)
Total Protein: 6.9 g/dL (ref 6.5–8.1)

## 2023-04-14 LAB — RESP PANEL BY RT-PCR (RSV, FLU A&B, COVID)  RVPGX2
Influenza A by PCR: POSITIVE — AB
Influenza B by PCR: NEGATIVE
Resp Syncytial Virus by PCR: NEGATIVE
SARS Coronavirus 2 by RT PCR: NEGATIVE

## 2023-04-14 LAB — LIPASE, BLOOD: Lipase: 29 U/L (ref 11–51)

## 2023-04-14 MED ORDER — SODIUM CHLORIDE 0.9 % IV SOLN
1.0000 g | Freq: Once | INTRAVENOUS | Status: AC
  Administered 2023-04-14: 1 g via INTRAVENOUS
  Filled 2023-04-14: qty 10

## 2023-04-14 MED ORDER — PROMETHAZINE HCL 25 MG PO TABS: 25.0000 mg | ORAL_TABLET | Freq: Three times a day (TID) | ORAL | 0 refills | Status: AC | PRN

## 2023-04-14 MED ORDER — LIDOCAINE-EPINEPHRINE-TETRACAINE (LET) TOPICAL GEL
3.0000 mL | Freq: Once | TOPICAL | Status: AC
  Administered 2023-04-14: 3 mL via TOPICAL
  Filled 2023-04-14: qty 3

## 2023-04-14 MED ORDER — SODIUM CHLORIDE 0.9 % IV BOLUS
1000.0000 mL | Freq: Once | INTRAVENOUS | Status: AC
  Administered 2023-04-14: 1000 mL via INTRAVENOUS

## 2023-04-14 MED ORDER — DOXYCYCLINE HYCLATE 100 MG PO CAPS: 100.0000 mg | ORAL_CAPSULE | Freq: Two times a day (BID) | ORAL | 0 refills | Status: AC

## 2023-04-14 MED ORDER — PROMETHAZINE HCL 25 MG RE SUPP: 25.0000 mg | Freq: Three times a day (TID) | RECTAL | 0 refills | Status: DC | PRN

## 2023-04-14 MED ORDER — HEPARIN SOD (PORK) LOCK FLUSH 100 UNIT/ML IV SOLN
500.0000 [IU] | Freq: Once | INTRAVENOUS | Status: AC
  Administered 2023-04-14: 500 [IU]
  Filled 2023-04-14: qty 5

## 2023-04-14 MED ORDER — DOXYCYCLINE HYCLATE 100 MG PO TABS
100.0000 mg | ORAL_TABLET | Freq: Once | ORAL | Status: AC
  Administered 2023-04-14: 100 mg via ORAL
  Filled 2023-04-14: qty 1

## 2023-04-14 NOTE — ED Triage Notes (Signed)
 Pt coming in via EMS for N/V/D and a productive cough for the past 3 days. Pt endorses mild abdominal pain 5/10

## 2023-04-14 NOTE — ED Notes (Signed)
 Chest port accessed using sterile technique, immediate return of blood noted, no resistance with flushing

## 2023-04-14 NOTE — ED Notes (Signed)
 Attempted transport to imaging, [email or web address] 

## 2023-04-14 NOTE — Discharge Instructions (Addendum)
 You were diagnosed with influenza today.  This is a viral illness that can last 5 to 7 days.  Please make every effort to stay hydrated at home, drinking water  or low calorie Gatorade.  Try to eat some saltines or crackers and a little bit of food every day.  I prescribed you Phenergan  which is a medication to take as needed for bad nausea.  If you are not nauseous do not take this medicine.  If you are having vomiting cannot keep down oral medicine, I prescribed you Phenergan  suppositories as an alternative.  These are tablets that you insert into your anus (bottom) and let dissolve.  I also prescribed 6 more days of an antibiotic for possible pneumonia.  From your x-ray it is possible that you have a small pneumonia in your left lower lung.  If you are still having vomiting, difficulty or inability to drink water , confusion, difficulty breathing, or any other emergency concerns, please return to the hospital.  Please follow up with your doctor next week to recheck your symptoms and blood tests.

## 2023-04-14 NOTE — ED Provider Notes (Signed)
 [place] [facility] Provider Note   CSN: [id number] Arrival date & time: 04/14/23  1916     History  Chief Complaint  Patient presents with   Diarrhea   Abdominal Pain    Lindsey York is a 58 y.o. 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  Patient reports his symptoms began 2 days ago.  She has a productive, rattling cough, pain in the left lower side of her chest, worse with inspiration.  She reports she does have some vertigo as well, which is a new symptom.  She says she has had some loose bowel movements at home for the past several hours beginning last night.  Subjective fevers at home.  She reports compliance with all of her home medications including her immunosuppressive medications status post transplant.  She reports her eldest son had a walking pneumonia with a bad cold symptoms last week  HPI     Home Medications Prior to Admission medications   Medication Sig Start Date End Date Taking? Authorizing Provider  doxycycline  (VIBRAMYCIN ) 100 MG capsule Take 1 capsule (100 mg total) by mouth 2 (two) times daily for 6 days. 04/15/23 04/21/23 Yes Nereyda Bowler, Donnice PARAS, MD  promethazine  (PHENERGAN ) 25 MG suppository Place 1 suppository (25 mg total) rectally every 8 (eight) hours as needed for up to 6 doses for refractory nausea / vomiting. 04/14/23  Yes Aasir Daigler, Donnice PARAS, MD  promethazine  (PHENERGAN ) 25 MG tablet Take 1 tablet (25 mg total) by mouth every 8 (eight) hours as needed for up to 12 doses for nausea or vomiting. 04/14/23  Yes Clydene Burack, Donnice PARAS, MD  acetaminophen  (TYLENOL ) 325 MG tablet Take 650 mg by mouth every 6 (six) hours as needed for moderate pain.    [provider]  acyclovir ointment (ZOVIRAX) 5 % Apply twice a day to affected area as needed 03/26/19   [provider]  amLODipine  (NORVASC ) 5 MG tablet Take 2.5 mg by mouth daily. 03/23/21   [provider]  aspirin  EC 81 MG tablet Take 1 tablet (81 mg total) by mouth daily. Swallow whole. 12/05/19   Court Dorn PARAS, MD  B COMPLEX-C-FOLIC ACID  ER PO Take by mouth. Patient not taking: Reported on 09/05/2022    [provider]  bevacizumab  (AVASTIN ) 400 MG/16ML SOLN Inject 1.25 mg as directed every 30 (thirty) days. Pt takes as needed. 03/22/17   [provider]  carvedilol  (COREG ) 12.5 MG tablet Take 12.5 mg by mouth 2 (two) times daily with a meal. Patient also take 6.25 mg twice daily    [provider]  ENVARSUS  XR 0.75 MG TB24 tablet Take 0.75 mg by mouth daily. Patient not taking: Reported on 09/05/2022 05/23/21   [provider]  ENVARSUS  XR 1 MG TB24 Take 1 mg by mouth daily. 2 tablet daily 05/23/21   [provider]  ezetimibe  (ZETIA ) 10 MG tablet TAKE 1 TABLET(10 MG) BY MOUTH DAILY 04/09/23   Court Dorn PARAS, MD  famotidine  (PEPCID ) 20 MG tablet Take 20 mg by mouth daily. 02/23/21   [provider]  mycophenolate  (MYFORTIC) 180 MG EC tablet Take by mouth. 05/17/21   [provider]  nitroGLYCERIN  (NITROSTAT ) 0.4 MG SL tablet Place 1 tablet (0.4 mg total) under the tongue every 5 (five) minutes as needed for chest pain. 11/09/20 02/07/21  Court,  Dorn PARAS, MD  predniSONE  (DELTASONE ) 5 MG tablet Take 5 mg by mouth daily.    [provider]  simvastatin  (ZOCOR ) 20 MG tablet TAKE 1 TABLET(20 MG) BY MOUTH AT BEDTIME 01/09/23   Court Dorn PARAS, MD  sucralfate (CARAFATE) 1 g tablet Take 1 g by mouth 2 (two) times daily. 03/10/21   [provider]  valGANciclovir (VALCYTE) 450 MG tablet Take 450 mg by mouth daily. 05/11/21   [provider]      Allergies    Hydroxychloroquine and Codeine    Review of Systems   Review of Systems  Physical Exam Updated Vital Signs BP 128/77   Pulse 68   Temp (!) 101 F  (38.3 C) (Oral)   Resp 18   SpO2 95%  Physical Exam Constitutional:      General: She is not in acute distress. HENT:     Head: Normocephalic and atraumatic.  Eyes:     Conjunctiva/sclera: Conjunctivae normal.     Pupils: Pupils are equal, round, and reactive to light.  Cardiovascular:     Rate and Rhythm: Normal rate and regular rhythm.  Pulmonary:     Effort: Pulmonary effort is normal. No respiratory distress.     Comments: Crackles and rhonchi noted in left lower lobe, productive cough on exam Abdominal:     General: There is no distension.     Tenderness: There is no abdominal tenderness.  Skin:    General: Skin is warm and dry.  Neurological:     General: No focal deficit present.     Mental Status: She is alert. Mental status is at baseline.  Psychiatric:        Mood and Affect: Mood normal.        Behavior: Behavior normal.     ED Results / Procedures / Treatments   Labs (all labs ordered are listed, but only abnormal results are displayed) Labs Reviewed  RESP PANEL BY RT-PCR (RSV, FLU A&B, COVID)  RVPGX2 - Abnormal; Notable for the following components:      Result Value   Influenza A by PCR POSITIVE (*)    All other components within normal limits  CBC WITH DIFFERENTIAL/PLATELET - Abnormal; Notable for the following components:   RBC 3.80 (*)    Platelets 112 (*)    Monocytes Absolute 1.1 (*)    Abs Immature Granulocytes 0.08 (*)    All other components within normal limits  COMPREHENSIVE METABOLIC PANEL - Abnormal; Notable for the following components:   Potassium 3.4 (*)    Glucose, Bld 116 (*)    BUN 27 (*)    Creatinine, Ser 1.77 (*)    Calcium  8.1 (*)    Albumin 3.2 (*)    GFR, Estimated 33 (*)    All other components within normal limits  LIPASE, BLOOD  URINALYSIS, ROUTINE W REFLEX MICROSCOPIC    EKG None  Radiology DG Chest 2 View Result Date: 04/14/2023 CLINICAL DATA:  Productive cough, left lower lobe rhonchi EXAM: CHEST - 2 VIEW  COMPARISON:  09/23/2022 FINDINGS: Mild left basilar opacity, atelectasis versus pneumonia. Right lung is clear. No pleural effusion or pneumothorax. The heart is normal in size. Right chest port terminates in the upper right atrium. IMPRESSION: Mild left basilar opacity, atelectasis versus pneumonia. Electronically Signed   By: Pinkie Pebbles M.D.   On: 04/14/2023 21:52    Procedures Procedures    Medications Ordered in ED Medications  cefTRIAXone  (ROCEPHIN ) 1 g in sodium chloride   0.9 % 100 mL IVPB (1 g Intravenous New Bag/Given 04/14/23 2214)  lidocaine -EPINEPHrine -tetracaine  (LET) topical gel (3 mLs Topical Given 04/14/23 2024)  sodium chloride  0.9 % bolus 1,000 mL (1,000 mLs Intravenous New Bag/Given 04/14/23 2203)  doxycycline  (VIBRA -TABS) tablet 100 mg (100 mg Oral Given 04/14/23 2213)    ED Course/ Medical Decision Making/ A&P Clinical Course as of 04/14/23 2235  Sat Apr 14, 2023  2206 Cr 1.77 (baseline 1.5), BUN 27 - no significant AKI noted here. [MT]    Clinical Course User Index [MT] Hillis Mcphatter, Donnice PARAS, MD                                 Medical Decision Making Amount and/or Complexity of Data Reviewed Labs: ordered. Radiology: ordered.  Risk Prescription drug management.   This patient presents to the ED with concern for fever, cough, fatigue, diarrhea. This involves an extensive number of treatment options, and is a complaint that carries with it a high risk of complications and morbidity.  The differential diagnosis includes viral pneumonia versus viral URI versus other viral infection versus bacterial pneumonia versus other  Co-morbidities that complicate the patient evaluation: Immunosuppressed status post transplant, at risk of infection  Additional history obtained from the patient's family at bedside  External records from outside source obtained and reviewed including transplant records  I ordered and personally interpreted labs.  The pertinent results  include: Influenza positive.  CBC unremarkable.  CMP near baseline levels.  Lipase within normal notes.  I ordered imaging studies including x-ray of the chest I independently visualized and interpreted imaging which showed potential left lower lobe infiltrate versus atelectasis I agree with the radiologist interpretation  The patient was maintained on a cardiac monitor.  I personally viewed and interpreted the cardiac monitored which showed an underlying rhythm of: Sinus rhythm  I ordered medication including IV fluid for hydration, IV Rocephin  and doxycycline  for community pneumonia  I do not believe the patient is requiring Tamiflu, given her significant GI upset already, I do not think the very modest benefits of this medication outweigh the side effect risks  I have reviewed the patients home medicines and have made adjustments as needed  Test Considered: Low suspicion for acute PE.  Low suspicion for acute intra-abdominal pathology as a source of her symptoms.  She does not have abdominal guarding or tenderness on exam.  I do not see indication for emergent CT imaging of the abdomen.  After the interventions noted above, I reevaluated the patient and found that they have: improved   We discussed the option of hospitalization given that she is immunosuppressed, and at higher risk for dehydration and kidney injury.  However the patient preferred to go home in the company of her family, they can watch her, and I think this is reasonable.  Most likely she is experiencing a viral syndrome as her last 5 to 7 days.  We discussed importance of staying hydrated and she understands this.  I would not start her on potassium supplementation as she is at high risk of hyperkalemia if she were to dehydrate.  Her potassium is borderline to normal.  She is also not having any significant diarrhea and her stay in the ED.  I also thought it was reasonable to treat and begin treatment for community pneumonia  given a possible infiltrate in her left lower lobe.  It is not clear whether this is truly  a bacterial infection or atelectasis, but with her immunocompromise history, it is reasonable to treat.  I do not see evidence of sepsis.  The patient has not tolerated Zofran  at home in the past.  I prefer not to prescribe scopolamine which she has used in the past, given increased risk of dizziness or dehydration.  We discussed Phenergan  which she would like to try at home.  Here she is tolerating fluids.  Dispostion:  After consideration of the diagnostic results and the patients response to treatment, I feel that the patent would benefit from close outpatient follow-up.         Final Clinical Impression(s) / ED Diagnoses Final diagnoses:  Influenza A  Pneumonia of left lower lobe due to infectious organism    Rx / DC Orders ED Discharge Orders          Ordered    doxycycline  (VIBRAMYCIN ) 100 MG capsule  2 times daily        04/14/23 2231    promethazine  (PHENERGAN ) 25 MG tablet  Every 8 hours PRN        04/14/23 2231    promethazine  (PHENERGAN ) 25 MG suppository  Every 8 hours PRN        04/14/23 2231              Cottie Donnice PARAS, MD 04/14/23 2235

## 2023-04-17 ENCOUNTER — Telehealth (HOSPITAL_BASED_OUTPATIENT_CLINIC_OR_DEPARTMENT_OTHER): Payer: Self-pay | Admitting: Emergency Medicine

## 2023-04-17 MED ORDER — BENZONATATE 100 MG PO CAPS: 100.0000 mg | ORAL_CAPSULE | Freq: Three times a day (TID) | ORAL | 0 refills | Status: DC

## 2023-04-26 DIAGNOSIS — J209 Acute bronchitis, unspecified: Secondary | ICD-10-CM | POA: Diagnosis not present

## 2023-04-26 DIAGNOSIS — R051 Acute cough: Secondary | ICD-10-CM | POA: Diagnosis not present

## 2023-05-01 DIAGNOSIS — J988 Other specified respiratory disorders: Secondary | ICD-10-CM | POA: Diagnosis not present

## 2023-05-04 DIAGNOSIS — Z940 Kidney transplant status: Secondary | ICD-10-CM | POA: Diagnosis not present

## 2023-05-04 DIAGNOSIS — Z7960 Long term (current) use of unspecified immunomodulators and immunosuppressants: Secondary | ICD-10-CM | POA: Diagnosis not present

## 2023-05-07 DIAGNOSIS — N3281 Overactive bladder: Secondary | ICD-10-CM | POA: Diagnosis not present

## 2023-05-07 DIAGNOSIS — R311 Benign essential microscopic hematuria: Secondary | ICD-10-CM | POA: Diagnosis not present

## 2023-05-07 DIAGNOSIS — Z940 Kidney transplant status: Secondary | ICD-10-CM | POA: Diagnosis not present

## 2023-05-10 DIAGNOSIS — R808 Other proteinuria: Secondary | ICD-10-CM | POA: Diagnosis not present

## 2023-05-10 DIAGNOSIS — N269 Renal sclerosis, unspecified: Secondary | ICD-10-CM | POA: Diagnosis not present

## 2023-05-10 DIAGNOSIS — Z940 Kidney transplant status: Secondary | ICD-10-CM | POA: Diagnosis not present

## 2023-05-10 DIAGNOSIS — M329 Systemic lupus erythematosus, unspecified: Secondary | ICD-10-CM | POA: Diagnosis not present

## 2023-05-10 DIAGNOSIS — N261 Atrophy of kidney (terminal): Secondary | ICD-10-CM | POA: Diagnosis not present

## 2023-05-10 DIAGNOSIS — I10 Essential (primary) hypertension: Secondary | ICD-10-CM | POA: Diagnosis not present

## 2023-05-10 DIAGNOSIS — Z7952 Long term (current) use of systemic steroids: Secondary | ICD-10-CM | POA: Diagnosis not present

## 2023-05-10 DIAGNOSIS — R809 Proteinuria, unspecified: Secondary | ICD-10-CM | POA: Diagnosis not present

## 2023-05-10 DIAGNOSIS — I701 Atherosclerosis of renal artery: Secondary | ICD-10-CM | POA: Diagnosis not present

## 2023-05-10 DIAGNOSIS — M3214 Glomerular disease in systemic lupus erythematosus: Secondary | ICD-10-CM | POA: Diagnosis not present

## 2023-05-10 DIAGNOSIS — Z4822 Encounter for aftercare following kidney transplant: Secondary | ICD-10-CM | POA: Diagnosis not present

## 2023-05-10 DIAGNOSIS — Z7982 Long term (current) use of aspirin: Secondary | ICD-10-CM | POA: Diagnosis not present

## 2023-05-10 DIAGNOSIS — T8610 Unspecified complication of kidney transplant: Secondary | ICD-10-CM | POA: Diagnosis not present

## 2023-05-18 DIAGNOSIS — N3281 Overactive bladder: Secondary | ICD-10-CM | POA: Diagnosis not present

## 2023-05-24 ENCOUNTER — Telehealth: Payer: Self-pay | Admitting: Cardiovascular Disease

## 2023-05-24 DIAGNOSIS — I214 Non-ST elevation (NSTEMI) myocardial infarction: Secondary | ICD-10-CM | POA: Diagnosis not present

## 2023-05-24 DIAGNOSIS — N185 Chronic kidney disease, stage 5: Secondary | ICD-10-CM | POA: Diagnosis not present

## 2023-05-24 DIAGNOSIS — R7303 Prediabetes: Secondary | ICD-10-CM | POA: Diagnosis not present

## 2023-05-24 DIAGNOSIS — I10 Essential (primary) hypertension: Secondary | ICD-10-CM | POA: Diagnosis not present

## 2023-05-24 DIAGNOSIS — I2510 Atherosclerotic heart disease of native coronary artery without angina pectoris: Secondary | ICD-10-CM | POA: Diagnosis not present

## 2023-05-24 NOTE — Telephone Encounter (Signed)
 Patient insisted on scheduling an appointment with an APP to discuss [facility]. She declined discussing with me and stated she would prefer to schedule an appointment with APP. She preferred that I not send a message to avoid miscommunication. I informed her since she didn't specify what it is pertaining to I wouldn't be able to notate anything in particular. She scheduled for tomorrow,  2/21 at 3:10 with [place]aver, NP--FYI.

## 2023-05-24 NOTE — Progress Notes (Deleted)
 Cardiology Clinic Note   Patient Name: Lindsey York Date of Encounter: 05/24/2023  Primary Care Provider:  Patient, No Pcp Per Primary Cardiologist:  Nanetta Batty, MD  Patient Profile    Lindsey York 58 year old female presents to the clinic today for follow-up evaluation of her coronary artery disease and hypertension.  Past Medical History    Past Medical History:  Diagnosis Date   Anemia    CFS (chronic fatigue syndrome)    CKD (chronic kidney disease), stage V (HCC)    "followed at [facility]" (08/28/2017)   Constipation    Coronary artery disease    CSR (central serous retinopathy)    Current chronic use of systemic steroids    Diarrhea 09/2017   Edema of both lower extremities    GERD (gastroesophageal reflux disease)    High cholesterol    High risk medication use    History of gout    "on daily RX"" (08/28/2017)   Hypertension    NSTEMI (non-ST elevated myocardial infarction) (HCC) 08/26/2017   OAB (overactive bladder)    RA (rheumatoid arthritis) (HCC)    Systemic lupus (HCC)    Vitamin D deficiency    Past Surgical History:  Procedure Laterality Date   BREAST SURGERY Left ~ 1978   "tumors taken out"   CORONARY ANGIOGRAPHY N/A 12/31/2017   Procedure: CORONARY ANGIOGRAPHY;  Surgeon: Runell Gess, MD;  Location: [facility] INV[facility]IVE CV [facility];  Service: Cardiovascular;  Laterality: N/A;   CORONARY STENT INTERVENTION N/A 08/28/2017   Procedure: CORONARY STENT INTERVENTION;  Surgeon: Kathleene Hazel, MD;  Location: [facility] INVASIVE CV LAB;  Service: Cardiovascular;  Laterality: N/A;   CORONARY STENT INTERVENTION  12/31/2017   CORONARY STENT INTERVENTION N/A 12/31/2017   Procedure: CORONARY STENT INTERVENTION;  Surgeon: Runell Gess, MD;  Location: [facility] INV[facility] CV LAB;  Service: Cardiovascular;  Laterality: N/A;   HERNIA REPAIR     LAPAROSCOPIC CHOLECYSTECTOMY     LEFT HEART CATH AND CORONARY ANGIOGRAPHY N/A 08/28/2017   Procedure: LEFT HEART CATH AND CORONARY  ANGIOGRAPHY;  Surgeon: Kathleene Hazel, MD;  Location: [facility] CV [facility];  Service: Cardiovascular;  Laterality: N/A;   MOUTH SURGERY     oral cavity biopsy   TUBAL LIGATION     UMBILICAL HERNIA REPAIR      Allergies  Allergies  Allergen Reactions   Hydroxychloroquine Hives and Rash   Codeine     History of Present Illness    Lindsey York has a PMH of renal transplant 01/31/2021,  lupus, HTN, HLD, gout, GERD, CKD stage V, anemia NSTEMI 08/26/2017.  Underwent cardiac catheterization by Dr. Clifton James via right femoral approach which showed a subtotal occluded first marginal branch which received DES x1.  She also was noted to have 80% mid RCA stenosis which was not intervened on due to her renal insufficiency.  She underwent subsequent PCI and received DES x1 to her RCA 01/01/2028.  She is on the renal transplant list at [facility].   She was  seen by Dr. Allyson Sabal on 12/05/2019.  During that time she had no cardiac complaints.  She was started on hemodialysis 3/21 and was doing well.  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  Due to the time since her PCI her Brilinta was also safely stopped and she was placed back on 81 mg aspirin.  She denied chest pain and shortness of breath.   She contacted nurse triage line on 01/21/2020  and indicated that she was having periods of low blood pressure.  This was making her hemodialysis challenging.   She was seen virtually 01/2820 in follow-up and stated she felt well.  She had been having trouble with her blood pressure being too low prior to hemodialysis.  The nephrologist  discontinued her amlodipine and hydralazine.  She had tried to continue to take her clonidine and her carvedilol however her blood pressures  remained fairly low and they were not able to pull the amount of fluid that was needed during her HD treatments.  She reported compliance with her aspirin and had not had further  bleeding issues from her fistula.  I  discontinued her clonidine and changed her carvedilol to 3.125 twice daily.  She continued to monitor her diet closely and stayed physically active.  She remained on the [facility] renal transplant list.  I  gave her the salty 6 diet sheet, had her increase her physical activity as tolerated, keep a blood pressure log, and planned her follow-up for 1 month.   She was seen virtually 02/23/2020 in follow-up and stated she was  receiving  a HD session.  She stated that she had needed to adjust her carvedilol due to lower blood pressures.  Occasionally she was taking it only once daily.  She also stated that her furosemide was only producing small amounts of urine at this time.  She was limited in her physical activity due to right foot issues/orthopedic issues.  She was planning on having a steroid injection which she  had good success with in the past.  She had received her COVID-19 booster and continued to socially distance, wearing her mask in public.  She was still hopeful for renal transplant.  She had been maintaining a strict diet, and monitoring her weight.  We  discontinued her furosemide, repeated a lipid panel, and planned follow-up in 6 months.  She was seen in follow-up by Dr. Allyson Sabal on 11/09/2020.  During that time she was stable from a cardiac standpoint.  She was tolerating her antihypertensive medication well.  She denied chest pain or shortness of breath.  Her EKG showed normal sinus rhythm 71 bpm with nonspecific ST and T wave changes.  Underwent renal transplant 01/31/2021.  Her postoperative course was complicated by tachycardia and labile blood pressures.  She was noted to have EKG changes concerning for STEMI and cardiology was consulted.  She was noted to have a slight increase in her troponins but, serial troponins were negative.  Her echocardiogram showed preserved LVEF and no wall motion abnormalities.  She was noted to have right arm swelling  on 11 1.  Right upper extremity duplex showed acute occlusive DVT of her paired radial veins and one of the paired ulnar veins at the wrist.  Vascular surgery was consulted and recommended compression and elevation of her right upper extremity.  Her swelling improved and no surgical intervention was indicated.  Hematology was consulted and recommended anticoagulation with argatroban.  She continued to require intermittent hemodialysis.  Her renal function slowly improved.  She was continued on immunosuppression.   She presented to the clinic 05/24/21 for follow-up evaluation stated she  had some difficulty with her stomach since her transplant.  She was been transitioned off of CellCept and is doing much better.  She continued to follow with [facility] and has been having follow-up appointments every other week with exams and lab work.  She was looking forward to increasing her  physical activity as her stomach returns to normal.  We reviewed her blood pressure today and at home it is ranging in the 120-130 systolic.  She had not noticed blood pressure fluctuations since being off of hemodialysis.  She did have 1 treatment after her transplant but has not needed further HD treatments.  We will continue her current medication regimen, have her increase her physical activity as tolerated, and we planned follow-up for 3 to 4 months.  She was seen in follow-up by Dr. Allyson Sabal 09/05/2022.  She continued to do well from a cardiac standpoint.  She continued to be off dialysis.  She denied chest pain but did notice some dyspnea.  She had gained around 30 pounds since she was last seen.  She presents to the clinic today for follow-up evaluation and states***.  Today she denies chest pain, shortness of breath, lower extremity edema, increased fatigue, palpitations, melena, hematuria, hemoptysis, diaphoresis, weakness, presyncope, syncope, orthopnea, and PND.   Home Medications    Prior to Admission medications    Medication Sig Start Date End Date Taking? Authorizing Provider  acetaminophen (TYLENOL) 325 MG tablet Take 650 mg by mouth every 6 (six) hours as needed for moderate pain.    [provider]  acyclovir ointment (ZOVIRAX) 5 % Apply twice a day to affected area as needed 03/26/19   [provider]  aspirin EC 81 MG tablet Take 1 tablet (81 mg total) by mouth daily. Swallow whole. 12/05/19   Runell Gess, MD  B COMPLEX-C-FOLIC ACID ER PO Take by mouth.    [provider]  bevacizumab (AVASTIN) 400 MG/16ML SOLN Inject 1.25 mg as directed every 30 (thirty) days. Pt takes as needed. 03/22/17   [provider]  calcium carbonate (OS-CAL) 1250 (500 Ca) MG chewable tablet Chew by mouth. 03/21/19   [provider]  carvedilol (COREG) 3.125 MG tablet Take 1 tablet (3.125 mg total) by mouth 2 (two) times daily with a meal. 02/02/20   Ronney Asters, NP  conjugated estrogens (PREMARIN) vaginal cream  11/03/19   [provider]  ezetimibe (ZETIA) 10 MG tablet TAKE 1 TABLET(10 MG) BY MOUTH DAILY 12/13/20   Runell Gess, MD  nitroGLYCERIN (NITROSTAT) 0.4 MG SL tablet Place 1 tablet (0.4 mg total) under the tongue every 5 (five) minutes as needed for chest pain. 11/09/20 02/07/21  Runell Gess, MD  predniSONE (DELTASONE) 1 MG tablet Take 4 mg by mouth daily with breakfast. Pt takes 4mg  the every day    [provider]  sevelamer carbonate (RENVELA) 800 MG tablet Take 800 mg by mouth in the morning, at noon, in the evening, and at bedtime. 11/15/17   [provider]  simvastatin (ZOCOR) 20 MG tablet TAKE 1 TABLET(20 MG) BY MOUTH AT BEDTIME 10/18/20   Runell Gess, MD  valACYclovir (VALTREX) 500 MG tablet Take by mouth. Pt takes as needed. 06/26/19   [provider]    Family History    Family History  Problem Relation Age of Onset   High blood pressure Mother    She indicated that the status of her mother is  unknown.  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  Tobacco Use   Smoking status: Never   Smokeless tobacco: Never  Vaping Use   Vaping status: Never Used  Substance and Sexual Activity   Alcohol use: Never   Drug use: Never   Sexual activity: Yes  Other Topics Concern   Not on file  Social History Narrative   Not on file   Social Drivers of Health   Financial Resource Strain: Low Risk  (03/14/2023)   Received from [facility]   Overall Financial Resource Strain (CARDIA)    Difficulty of Paying Living Expenses: Not hard at all  Food Insecurity: No Food Insecurity (03/14/2023)   Received from [facility]   Hunger Vital Sign    Worried About Running Out of Food in the Last Year: Never true    Ran Out of Food in the Last Year: Never true  Transportation Needs: No Transportation Needs (03/14/2023)   Received from [facility] - Transportation    Lack of Transportation (Medical): No    Lack of Transportation (Non-Medical): No  Physical Activity: Not on file  Stress: Stress Concern Present (02/28/2022)   [employer] of [employer]ational [employer] - Occupational Stress Questionnaire    Feeling of Stress : To some extent  Social Connections: Unknown (08/12/2021)   Received from [facility]   Social Network    Social Network: Not on file  Intimate Partner Violence: Unknown (07/05/2021)   Received from [facility], [facility]   HITS    Physically Hurt: Not on file    Insult or Talk Down To: Not on file    Threaten Physical Harm: Not on file    Scream or Curse: Not on file     Review of Systems    General:  No chills, fever, night sweats or weight changes.   Cardiovascular:  No chest pain, dyspnea on exertion, edema, orthopnea, palpitations, paroxysmal nocturnal dyspnea. Dermatological: No rash, lesions/masses Respiratory: No cough, dyspnea Urologic: No hematuria, dysuria Abdominal:   No nausea, vomiting, diarrhea, bright red blood per rectum, melena, or hematemesis Neurologic:  No visual changes, wkns, changes in mental status. All other systems reviewed and are otherwise negative except as noted above.  Physical Exam    VS:  There were no vitals taken for this visit. , BMI There is no height or weight on file to calculate BMI. GEN: Well nourished, well developed, in no acute distress. HEENT: normal. Neck: Supple, no JVD, carotid bruits, or masses. Cardiac: RRR, no murmurs, rubs, or gallops. No clubbing, cyanosis, edema.  Radials/DP/PT 2+ and equal bilaterally.  Respiratory:  Respirations regular and unlabored, clear to auscultation bilaterally. GI: Soft, nontender, nondistended, BS + x 4. MS: no deformity or atrophy. Skin: warm and dry, no rash. Neuro:  Strength and sensation are intact. Psych: Normal affect.  Accessory Clinical Findings    Recent Labs: 04/14/2023: ALT 15; BUN 27; Creatinine, Ser 1.77; Hemoglobin 12.2; Platelets 112; Potassium 3.4; Sodium 136   Recent Lipid Panel    Component Value Date/Time   CHOL 141 11/06/2018 0910   TRIG 72 11/06/2018 0910   HDL 61 11/06/2018 0910   CHOLHDL 2.3 11/06/2018 0910   CHOLHDL 3.8 08/30/2017 0227   VLDL 22 08/30/2017 0227   LDLCALC 66 11/06/2018 0910    ECG personally reviewed by me today-***  EKG normal sinus rhythm 68 bpm no acute changes.  Echocardiogram 08/29/2017  Study Conclusions   - Left ventricle: The cavity size was normal.  Wall thickness was    increased in a pattern of moderate LVH. Systolic function was    normal. The estimated ejection fraction was in the range of 60%    to 65%. Wall motion was normal; there were no regional wall    motion abnormalities.  Doppler parameters are consistent with    abnormal left ventricular relaxation (grade 1 diastolic    dysfunction). The E/e&' ratio is between 8-15, suggesting    indeterminate LV filling pressure.  - Aortic valve: Trileaflet. There was no stenosis. There was    trivial regurgitation.  - Mitral valve: Mildly thickened leaflets . There was trivial    regurgitation.  - Left atrium: The atrium was normal in size.  - Tricuspid valve: There was trivial regurgitation.  - Pulmonary arteries: PA peak pressure: 21 mm Hg (S).  - Inferior vena cava: The vessel was normal in size. The    respirophasic diameter changes were in the normal range (>= 50%),    consistent with normal central venous pressure.    Cardiac catheterization 12/31/2017 Mid RCA lesion is 80% stenosed. Previously placed Ost 2nd Mrg to 2nd Mrg stent (unknown type) is widely patent. A drug-eluting stent was successfully placed. Post intervention, there is a 0% residual stenosis.   Lindsey York is a 58 y.o. female Diagnostic Dominance: Right Intervention      Assessment & Plan   1.  Coronary artery disease-denies anginal symptoms.  With cardiac catheterization on 08/26/2017 and received PCI with DES x1 to her first marginal branch.  Due to her renal function her RCA was not intervened upon at that time.  She underwent cardiac catheterization 12/31/2017 and received PCI to her RCA.  Was felt to have EKG changes concerning for STEMI after renal transplant.  Had slight increase in troponins but serial troponins were negative.  Echocardiogram showed preserved LVEF and no wall motion abnormalities.  Maintain physical activity continue aspirin, carvedilol, clonidine, furosemide, Zetia, simvastatin Heart healthy low-sodium diet Maintain physical activity  Essential hypertension-BP today 136***/88.     Maintain blood pressure log.   Continue carvedilol Heart healthy low-sodium diet-salty 6 reviewed    Diastolic  dysfunction-euvolemic.  Echocardiogram 5/19 showed normal LVEF and G1 DD.  Patient interested in starting semaglutide/GLP-1 for cardiac issues and weight reduction. Continue carvedilol Refer to pharmacy for consideration of GLP-1  HLD-LDL 46***on 12/22 Continue Zetia, simvastatin Heart healthy low-sodium high-fiber diet Increase physical activity as tolerated Follows with PCP   History of renal transplant-status post transplant procedure 01/31/2021.  Details above.  Remains stable. Follows with [facility] transplant center  Disposition: Follow-up with Dr. Allyson Sabal or me in 6-9 months.   Thomasene Ripple. Margorie Renner NP-C    05/24/2023, 12:59 PM [facility] [address] Office [phone] Fax [phone]  Notice: This dictation was prepared with Dragon dictation along with smaller phrase technology. Any transcriptional errors that result from this process are unintentional and may not be corrected upon review.  I spent 14*** minutes examining this patient, reviewing medications, and using patient centered shared decision making involving her cardiac care.  Prior to her visit I spent greater than 20 minutes reviewing her past medical history,  medications, and prior cardiac tests.

## 2023-05-25 ENCOUNTER — Ambulatory Visit: Payer: Medicare Other | Admitting: General Practice

## 2023-05-25 NOTE — Telephone Encounter (Signed)
 Pt states that Reginal Lutes is not preferred with her insurance, preferred is Ozempic and Monjaro.   Spoke with Edd Fabian, FNP-C he states that [facility] and Ozempic are the same medication. Ok for PCP to [facility] [employer] [facility] with titration up to regular dosing.   Pt informed of providers result & recommendations. Pt verbalized understanding.pt would like to cancel her appt today and discuss with her PCP at [facility]

## 2023-05-31 DIAGNOSIS — L218 Other seborrheic dermatitis: Secondary | ICD-10-CM | POA: Diagnosis not present

## 2023-06-01 DIAGNOSIS — E1321 Other specified diabetes mellitus with diabetic nephropathy: Secondary | ICD-10-CM | POA: Diagnosis not present

## 2023-06-01 DIAGNOSIS — Z7960 Long term (current) use of unspecified immunomodulators and immunosuppressants: Secondary | ICD-10-CM | POA: Diagnosis not present

## 2023-06-01 DIAGNOSIS — Z940 Kidney transplant status: Secondary | ICD-10-CM | POA: Diagnosis not present

## 2023-06-05 DIAGNOSIS — I214 Non-ST elevation (NSTEMI) myocardial infarction: Secondary | ICD-10-CM | POA: Diagnosis not present

## 2023-06-19 DIAGNOSIS — H353131 Nonexudative age-related macular degeneration, bilateral, early dry stage: Secondary | ICD-10-CM | POA: Diagnosis not present

## 2023-06-19 DIAGNOSIS — H35712 Central serous chorioretinopathy, left eye: Secondary | ICD-10-CM | POA: Diagnosis not present

## 2023-06-19 DIAGNOSIS — H2513 Age-related nuclear cataract, bilateral: Secondary | ICD-10-CM | POA: Diagnosis not present

## 2023-06-21 DIAGNOSIS — M810 Age-related osteoporosis without current pathological fracture: Secondary | ICD-10-CM | POA: Diagnosis not present

## 2023-06-21 DIAGNOSIS — Z79899 Other long term (current) drug therapy: Secondary | ICD-10-CM | POA: Diagnosis not present

## 2023-06-21 DIAGNOSIS — M1A9XX0 Chronic gout, unspecified, without tophus (tophi): Secondary | ICD-10-CM | POA: Diagnosis not present

## 2023-06-21 DIAGNOSIS — N186 End stage renal disease: Secondary | ICD-10-CM | POA: Diagnosis not present

## 2023-06-21 DIAGNOSIS — M3214 Glomerular disease in systemic lupus erythematosus: Secondary | ICD-10-CM | POA: Diagnosis not present

## 2023-06-21 DIAGNOSIS — Z7952 Long term (current) use of systemic steroids: Secondary | ICD-10-CM | POA: Diagnosis not present

## 2023-06-21 DIAGNOSIS — M329 Systemic lupus erythematosus, unspecified: Secondary | ICD-10-CM | POA: Diagnosis not present

## 2023-06-29 DIAGNOSIS — Z940 Kidney transplant status: Secondary | ICD-10-CM | POA: Diagnosis not present

## 2023-06-29 DIAGNOSIS — R140 Abdominal distension (gaseous): Secondary | ICD-10-CM | POA: Diagnosis not present

## 2023-06-29 DIAGNOSIS — M329 Systemic lupus erythematosus, unspecified: Secondary | ICD-10-CM | POA: Diagnosis not present

## 2023-06-29 DIAGNOSIS — R198 Other specified symptoms and signs involving the digestive system and abdomen: Secondary | ICD-10-CM | POA: Diagnosis not present

## 2023-07-09 DIAGNOSIS — N185 Chronic kidney disease, stage 5: Secondary | ICD-10-CM | POA: Diagnosis not present

## 2023-07-09 DIAGNOSIS — Z713 Dietary counseling and surveillance: Secondary | ICD-10-CM | POA: Diagnosis not present

## 2023-07-09 DIAGNOSIS — Z940 Kidney transplant status: Secondary | ICD-10-CM | POA: Diagnosis not present

## 2023-07-11 DIAGNOSIS — I214 Non-ST elevation (NSTEMI) myocardial infarction: Secondary | ICD-10-CM | POA: Diagnosis not present

## 2023-07-16 DIAGNOSIS — N3281 Overactive bladder: Secondary | ICD-10-CM | POA: Diagnosis not present

## 2023-07-16 DIAGNOSIS — Z940 Kidney transplant status: Secondary | ICD-10-CM | POA: Diagnosis not present

## 2023-07-16 DIAGNOSIS — R311 Benign essential microscopic hematuria: Secondary | ICD-10-CM | POA: Diagnosis not present

## 2023-07-17 DIAGNOSIS — Z940 Kidney transplant status: Secondary | ICD-10-CM | POA: Diagnosis not present

## 2023-07-19 DIAGNOSIS — N644 Mastodynia: Secondary | ICD-10-CM | POA: Diagnosis not present

## 2023-08-05 DIAGNOSIS — J9801 Acute bronchospasm: Secondary | ICD-10-CM | POA: Diagnosis not present

## 2023-08-05 DIAGNOSIS — Z1152 Encounter for screening for COVID-19: Secondary | ICD-10-CM | POA: Diagnosis not present

## 2023-08-05 DIAGNOSIS — J069 Acute upper respiratory infection, unspecified: Secondary | ICD-10-CM | POA: Diagnosis not present

## 2023-08-06 DIAGNOSIS — J9801 Acute bronchospasm: Secondary | ICD-10-CM | POA: Diagnosis not present

## 2023-08-06 DIAGNOSIS — J069 Acute upper respiratory infection, unspecified: Secondary | ICD-10-CM | POA: Diagnosis not present

## 2023-08-06 DIAGNOSIS — I214 Non-ST elevation (NSTEMI) myocardial infarction: Secondary | ICD-10-CM | POA: Diagnosis not present

## 2023-08-06 DIAGNOSIS — N185 Chronic kidney disease, stage 5: Secondary | ICD-10-CM | POA: Diagnosis not present

## 2023-08-14 DIAGNOSIS — Z940 Kidney transplant status: Secondary | ICD-10-CM | POA: Diagnosis not present

## 2023-08-31 DIAGNOSIS — R052 Subacute cough: Secondary | ICD-10-CM | POA: Diagnosis not present

## 2023-09-03 DIAGNOSIS — N185 Chronic kidney disease, stage 5: Secondary | ICD-10-CM | POA: Diagnosis not present

## 2023-09-03 DIAGNOSIS — I214 Non-ST elevation (NSTEMI) myocardial infarction: Secondary | ICD-10-CM | POA: Diagnosis not present

## 2023-09-11 DIAGNOSIS — Z7960 Long term (current) use of unspecified immunomodulators and immunosuppressants: Secondary | ICD-10-CM | POA: Diagnosis not present

## 2023-09-11 DIAGNOSIS — Z940 Kidney transplant status: Secondary | ICD-10-CM | POA: Diagnosis not present

## 2023-09-26 DIAGNOSIS — R809 Proteinuria, unspecified: Secondary | ICD-10-CM | POA: Diagnosis not present

## 2023-09-26 DIAGNOSIS — Z940 Kidney transplant status: Secondary | ICD-10-CM | POA: Diagnosis not present

## 2023-09-26 DIAGNOSIS — D849 Immunodeficiency, unspecified: Secondary | ICD-10-CM | POA: Diagnosis not present

## 2023-09-27 DIAGNOSIS — J984 Other disorders of lung: Secondary | ICD-10-CM | POA: Diagnosis not present

## 2023-10-03 ENCOUNTER — Ambulatory Visit: Attending: Cardiology | Admitting: Cardiovascular Disease

## 2023-10-03 ENCOUNTER — Encounter: Payer: Self-pay | Admitting: Cardiovascular Disease

## 2023-10-03 VITALS — BP 129/83 | HR 64 | Resp 16 | Ht 63.0 in | Wt 198.8 lb

## 2023-10-03 DIAGNOSIS — I214 Non-ST elevation (NSTEMI) myocardial infarction: Secondary | ICD-10-CM

## 2023-10-03 DIAGNOSIS — I252 Old myocardial infarction: Secondary | ICD-10-CM

## 2023-10-03 DIAGNOSIS — I2510 Atherosclerotic heart disease of native coronary artery without angina pectoris: Secondary | ICD-10-CM

## 2023-10-03 DIAGNOSIS — E782 Mixed hyperlipidemia: Secondary | ICD-10-CM | POA: Diagnosis not present

## 2023-10-03 DIAGNOSIS — R011 Cardiac murmur, unspecified: Secondary | ICD-10-CM | POA: Diagnosis not present

## 2023-10-03 DIAGNOSIS — I10 Essential (primary) hypertension: Secondary | ICD-10-CM

## 2023-10-03 NOTE — Patient Instructions (Signed)
 Medication Instructions:  The current medical regimen is effective;  continue present plan and medications.  *If you need a refill on your cardiac medications before your next appointment, please call your pharmacy*  Lab Work: Please have fasting lab work at your next blood draw.  If you have labs (blood work) drawn today and your tests are completely normal, you will receive your results only by: MyChart Message (if you have MyChart) OR A paper copy in the mail If you have any lab test that is abnormal or we need to change your treatment, we will call you to review the results.  Testing/Procedures: Your physician has requested that you have an echocardiogram. Echocardiography is a painless test that uses sound waves to create images of your heart. It provides your doctor with information about the size and shape of your heart and how well your heart's chambers and valves are working. This procedure takes approximately one hour. There are no restrictions for this procedure. Please do NOT wear cologne, perfume, aftershave, or lotions (deodorant is allowed). Please arrive 15 minutes prior to your appointment time.  Please note: We ask at that you not bring children with you during ultrasound (echo/ vascular) testing. Due to room size and safety concerns, children are not allowed in the ultrasound rooms during exams. Our front office staff cannot provide observation of children in our lobby area while testing is being conducted. An adult accompanying a patient to their appointment will only be allowed in the ultrasound room at the discretion of the ultrasound technician under special circumstances. We apologize for any inconvenience.   Follow-Up: At [facility], you and your health needs are our priority.  As part of our continuing mission to provide you with exceptional heart care, our providers are all part of one team.  This team includes your primary Cardiologist (physician) and  Advanced Practice Providers or APPs (Physician Assistants and Nurse Practitioners) who all work together to provide you with the care you need, when you need it.  Your next appointment:   1 year(s)  Provider:   Dorn Lesches, MD    We recommend signing up for the patient portal called MyChart.  Sign up information is provided on this After Visit Summary.  MyChart is used to connect with patients for Virtual Visits (Telemedicine).  Patients are able to view lab/test results, encounter notes, upcoming appointments, etc.  Non-urgent messages can be sent to your provider as well.   To learn more about what you can do with MyChart, go to [email or web address].

## 2023-10-03 NOTE — Assessment & Plan Note (Signed)
 History of CAD status post non-STEMI 08/26/2017 treated with PCI and stenting of first obtuse marginal branch which was occluded by Dr. Verlin via the femoral approach.  Because of her renal insufficiency the 80% fairly focal mid RCA stenosis was staged by myself 12/31/2017 using a Synergy drug-eluting stent via the femoral approach.  I used a total of 25 cc of contrast.  She has been asymptomatic since.

## 2023-10-03 NOTE — Progress Notes (Unsigned)
 10/03/2023 Lindsey York   1965/12/11  [id number]  Primary Physician Patient, No Pcp Per Primary Cardiologist: Lindsey JINNY Lesches MD FACP, [place], [place], [place]  HPI:  Lindsey York is a 58 y.o.  moderately overweight divorced African-American female mother of 2 children who I last saw in the office 09/05/2022.  She has a history of systemic lupus erythematosus, CKD 4 with serum creatinine is in the 5-6 range predialysis previously on the transplant list at [facility].  She also has history of treated hypertension hyperlipidemia.  She currently does not work.  She has never smoked.  She had a non-STEMI 08/26/2017 and catheterization performed by Dr. Verlin via the right femoral approach revealing a subtotally occluded first marginal branch which was stented with a synergy drug-eluting stent (2.25 mm x 20 mill meters).  She did have an 80% fairly focal mid dominant RCA stenosis which was not intervened on because of her renal insufficiency.  She is otherwise asymptomatic but wishes to continue on the renal transplant list which cannot occur until her RCA is intervened on.   I performed RCA PCI and drug-eluting stenting electively as an outpatient 12/31/2017 with a synergy drug-eluting stent via the right femoral approach.  She has done well since and remains on dual antiplatelet therapy.  I did use only 25 cc of contrast and this did not affect her renal function.   She did start hemodialysis in March of 2021 and is doing well with that.  She had some bleeding from her fistula site and stopped her aspirin .  We did stop her Brilinta  last year because of bleeding from her dialysis site.  Since I saw her a year ago she continues to do well.  She did end up having a renal transplant at [facility] 01/31/2021 and has been doing well since.  She is no longer on dialysis.  She denies chest pain but does have a little bit of dyspnea .  She did recently have viral and bacterial  pneumonia.   Current Meds  Medication Sig   acetaminophen  (TYLENOL ) 325 MG tablet Take 650 mg by mouth every 6 (six) hours as needed for moderate pain.   acyclovir ointment (ZOVIRAX) 5 % Apply twice a day to affected area as needed   aspirin  EC 81 MG tablet Take 1 tablet (81 mg total) by mouth daily. Swallow whole.   azaTHIOprine (IMURAN) 50 MG tablet Take 125 mg by mouth daily.   carvedilol  (COREG ) 12.5 MG tablet Take 12.5 mg by mouth 2 (two) times daily with a meal. Patient also take 6.25 mg twice daily   carvedilol  (COREG ) 6.25 MG tablet Take 6.25 mg by mouth 2 (two) times daily with a meal.   ENVARSUS  XR 0.75 MG TB24 tablet Take 0.75 mg by mouth daily.   ezetimibe  (ZETIA ) 10 MG tablet TAKE 1 TABLET(10 MG) BY MOUTH DAILY   famotidine  (PEPCID ) 20 MG tablet Take 20 mg by mouth daily.   losartan  (COZAAR ) 50 MG tablet Take 25 mg by mouth in the morning and at bedtime.   mycophenolate  (MYFORTIC) 180 MG EC tablet Take by mouth.   nitroGLYCERIN  (NITROSTAT ) 0.4 MG SL tablet Place 1 tablet (0.4 mg total) under the tongue every 5 (five) minutes as needed for chest pain.   predniSONE  (DELTASONE ) 5 MG tablet Take 5 mg by mouth daily.   promethazine  (PHENERGAN ) 25 MG tablet Take 1 tablet (25 mg total) by mouth every 8 (eight) hours as needed  for up to 12 doses for nausea or vomiting.   simvastatin  (ZOCOR ) 20 MG tablet TAKE 1 TABLET(20 MG) BY MOUTH AT BEDTIME   valGANciclovir (VALCYTE) 450 MG tablet Take 450 mg by mouth daily.     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  Tobacco Use   Smoking status: Never   Smokeless tobacco: Never  Vaping Use   Vaping  status: Never Used  Substance and Sexual Activity   Alcohol use: Never   Drug use: Never   Sexual activity: Yes  Other Topics Concern   Not on file  Social History Narrative   Not on file   Social Drivers of Health   Financial Resource Strain: Low Risk  (07/19/2023)   Received from [facility]   Overall Financial Resource Strain (CARDIA)    Difficulty of Paying Living Expenses: Not hard at all  Food Insecurity: Low Risk  (08/05/2023)   Received from [facility]   Hunger Vital Sign    Within the past 12 months, you worried that your food would run out before you got money to buy more: Never true    Within the past 12 months, the food you bought just didn't last and you didn't have money to get more. : Never true  Transportation Needs: No Transportation Needs (08/05/2023)   Received from [facility]    In the past 12 months, has lack of reliable transportation kept you from medical appointments, meetings, work or from getting things needed for daily living? : No  Physical Activity: Not on file  Stress: Stress Concern Present (02/28/2022)   [employer] of Occupational Health - Occupational Stress Questionnaire    Feeling of Stress : To some extent  Social Connections: Unknown (08/12/2021)   Received from [facility]   Social Network    Social Network: Not on file  Intimate Partner Violence: Unknown (07/05/2021)   Received from [facility]   HITS    Physically Hurt: Not on file    Insult or Talk Down To: Not on file    Threaten Physical Harm: Not on file    Scream or Curse: Not on file     Review of Systems: General: negative for chills, fever, night sweats or weight changes.  Cardiovascular: negative for chest pain, dyspnea on exertion, edema, orthopnea, palpitations, paroxysmal nocturnal dyspnea or shortness of breath Dermatological: negative for rash Respiratory: negative for cough or wheezing Urologic: negative for hematuria Abdominal: negative  for nausea, vomiting, diarrhea, bright red blood per rectum, melena, or hematemesis Neurologic: negative for visual changes, syncope, or dizziness All other systems reviewed and are otherwise negative except as noted above.    Blood pressure 129/83, pulse 64, resp. rate 16, height 5' 3 (1.6 m), weight 198 lb 12.8 oz (90.2 kg), SpO2 92%.  General appearance: alert and no distress Neck: no adenopathy, no carotid bruit, no JVD, supple, symmetrical, trachea midline, and thyroid not enlarged, symmetric, no tenderness/mass/nodules Lungs: clear to auscultation bilaterally Heart: Soft diastolic murmur at the left upper sternal border. Extremities: extremities normal, atraumatic, no cyanosis or edema Pulses: 2+  and symmetric Skin: Skin color, texture, turgor normal. No rashes or lesions Neurologic: Grossly normal  EKG EKG Interpretation Date/Time:  Wednesday October 03 2023 15:53:30 EDT Ventricular Rate:  63 PR Interval:  138 QRS Duration:  80 QT Interval:  412 QTC Calculation: 421 R Axis:   -14  Text Interpretation: Normal sinus rhythm Nonspecific T wave abnormality When compared with ECG of 01-Jan-2018 04:28, Inverted T waves have replaced nonspecific T wave abnormality in Inferior leads Nonspecific T wave abnormality, improved in Lateral leads QT has shortened Confirmed by Court Carrier [phone]) on 10/03/2023 3:58:25 PM    ASSESSMENT AND PLAN:   NSTEMI (non-ST elevated myocardial infarction) (HCC) History of CAD status post non-STEMI 08/26/2017 treated with PCI and stenting of first obtuse marginal branch which was occluded by Dr. Verlin via the femoral approach.  Because of her renal insufficiency the 80% fairly focal mid RCA stenosis was staged by myself 12/31/2017 using a Synergy drug-eluting stent via the femoral approach.  I used a total of 25 cc of contrast.  She has been asymptomatic since.  Hypertension History of essential hypertension with blood pressure measured today at 129/83.   She is on carvedilol .  Hyperlipidemia History of hyperlipidemia on statin therapy and Zetia .  We will check a lipid and liver profile when she has her blood rechecked via her port next month.     Carrier DOROTHA Court MD FACP,FACC,FAHA, [facility] 10/03/2023 4:12 PM

## 2023-10-03 NOTE — Assessment & Plan Note (Signed)
 History of essential hypertension with blood pressure measured today at 129/83.  She is on carvedilol .

## 2023-10-03 NOTE — Assessment & Plan Note (Signed)
 History of hyperlipidemia on statin therapy and Zetia .  We will check a lipid and liver profile when she has her blood rechecked via her port next month.

## 2023-10-08 DIAGNOSIS — R918 Other nonspecific abnormal finding of lung field: Secondary | ICD-10-CM | POA: Diagnosis not present

## 2023-10-08 DIAGNOSIS — J984 Other disorders of lung: Secondary | ICD-10-CM | POA: Diagnosis not present

## 2023-10-11 DIAGNOSIS — Z940 Kidney transplant status: Secondary | ICD-10-CM | POA: Diagnosis not present

## 2023-10-11 DIAGNOSIS — Z7960 Long term (current) use of unspecified immunomodulators and immunosuppressants: Secondary | ICD-10-CM | POA: Diagnosis not present

## 2023-11-04 ENCOUNTER — Other Ambulatory Visit: Payer: Self-pay | Admitting: Cardiovascular Disease

## 2023-11-06 ENCOUNTER — Telehealth: Payer: Self-pay | Admitting: Cardiovascular Disease

## 2023-11-06 NOTE — Telephone Encounter (Signed)
*  STAT* If patient is at the pharmacy, call can be transferred to refill team.   1. Which medications need to be refilled? (please list name of each medication and dose if known)   ezetimibe  (ZETIA ) 10 MG tablet     4. Which pharmacy/location (including street and city if local pharmacy) is medication to be sent to? [id number] - [place], [place] - [address] AT [address]     5. Do they need a 30 day or 90 day supply? 90

## 2023-11-07 MED ORDER — EZETIMIBE 10 MG PO TABS: 10.0000 mg | ORAL_TABLET | Freq: Every day | ORAL | 3 refills | Status: AC

## 2023-11-07 NOTE — Telephone Encounter (Signed)
 Refill for Zetia  has been sent to pt's pharmacy requested below.

## 2023-11-08 DIAGNOSIS — D849 Immunodeficiency, unspecified: Secondary | ICD-10-CM | POA: Diagnosis not present

## 2023-11-08 DIAGNOSIS — R7303 Prediabetes: Secondary | ICD-10-CM | POA: Diagnosis not present

## 2023-11-08 DIAGNOSIS — Z13220 Encounter for screening for lipoid disorders: Secondary | ICD-10-CM | POA: Diagnosis not present

## 2023-11-08 DIAGNOSIS — Z940 Kidney transplant status: Secondary | ICD-10-CM | POA: Diagnosis not present

## 2023-11-08 DIAGNOSIS — N185 Chronic kidney disease, stage 5: Secondary | ICD-10-CM | POA: Diagnosis not present

## 2023-11-12 ENCOUNTER — Encounter: Payer: Self-pay | Admitting: Cardiovascular Disease

## 2023-11-12 ENCOUNTER — Ambulatory Visit (HOSPITAL_BASED_OUTPATIENT_CLINIC_OR_DEPARTMENT_OTHER)
Admission: RE | Admit: 2023-11-12 | Discharge: 2023-11-12 | Disposition: A | Discharge status: Home / Self Care | Source: Ambulatory Visit | Attending: Cardiovascular Disease | Admitting: Cardiovascular Disease

## 2023-11-12 DIAGNOSIS — R011 Cardiac murmur, unspecified: Secondary | ICD-10-CM | POA: Diagnosis not present

## 2023-11-12 DIAGNOSIS — E782 Mixed hyperlipidemia: Secondary | ICD-10-CM

## 2023-11-12 LAB — ECHOCARDIOGRAM COMPLETE
AR max vel: 2.37 cm2
AV Area VTI: 2.62 cm2
AV Area mean vel: 2.65 cm2
AV Mean grad: 7 mmHg
AV Peak grad: 14 mmHg
Ao pk vel: 1.87 m/s
Area-P 1/2: 3.6 cm2
Calc EF: 58.4 %
MV M vel: 5.98 m/s
MV Peak grad: 143 mmHg
S' Lateral: 3.6 cm
Single Plane A2C EF: 60.6 %
Single Plane A4C EF: 58.5 %

## 2023-11-13 ENCOUNTER — Ambulatory Visit: Payer: Self-pay | Admitting: Cardiovascular Disease

## 2023-11-14 MED ORDER — ATORVASTATIN CALCIUM 20 MG PO TABS: 20.0000 mg | ORAL_TABLET | Freq: Every day | ORAL | 3 refills | Status: DC

## 2023-11-16 NOTE — Telephone Encounter (Signed)
 Pt would like for a nurse to call her before she picks up medication

## 2023-11-20 MED ORDER — ROSUVASTATIN CALCIUM 5 MG PO TABS: 5.0000 mg | ORAL_TABLET | ORAL | 3 refills | Status: AC

## 2023-11-20 NOTE — Addendum Note (Signed)
 Addended by: LORRENE FEDERICO CROME on: 11/20/2023 08:23 AM   Modules accepted: Orders

## 2023-11-22 DIAGNOSIS — I272 Pulmonary hypertension, unspecified: Secondary | ICD-10-CM | POA: Diagnosis not present

## 2023-11-22 DIAGNOSIS — E559 Vitamin D deficiency, unspecified: Secondary | ICD-10-CM | POA: Diagnosis not present

## 2023-11-22 DIAGNOSIS — Z79899 Other long term (current) drug therapy: Secondary | ICD-10-CM | POA: Diagnosis not present

## 2023-11-22 DIAGNOSIS — Z7952 Long term (current) use of systemic steroids: Secondary | ICD-10-CM | POA: Diagnosis not present

## 2023-11-22 DIAGNOSIS — M3214 Glomerular disease in systemic lupus erythematosus: Secondary | ICD-10-CM | POA: Diagnosis not present

## 2023-11-22 DIAGNOSIS — M810 Age-related osteoporosis without current pathological fracture: Secondary | ICD-10-CM | POA: Diagnosis not present

## 2023-11-22 DIAGNOSIS — M1A9XX0 Chronic gout, unspecified, without tophus (tophi): Secondary | ICD-10-CM | POA: Diagnosis not present

## 2023-11-22 DIAGNOSIS — N186 End stage renal disease: Secondary | ICD-10-CM | POA: Diagnosis not present

## 2023-11-22 DIAGNOSIS — M329 Systemic lupus erythematosus, unspecified: Secondary | ICD-10-CM | POA: Diagnosis not present

## 2023-12-06 DIAGNOSIS — M329 Systemic lupus erythematosus, unspecified: Secondary | ICD-10-CM | POA: Diagnosis not present

## 2023-12-06 DIAGNOSIS — Z940 Kidney transplant status: Secondary | ICD-10-CM | POA: Diagnosis not present

## 2023-12-06 DIAGNOSIS — R300 Dysuria: Secondary | ICD-10-CM | POA: Diagnosis not present

## 2023-12-06 DIAGNOSIS — Z7960 Long term (current) use of unspecified immunomodulators and immunosuppressants: Secondary | ICD-10-CM | POA: Diagnosis not present

## 2023-12-21 DIAGNOSIS — L905 Scar conditions and fibrosis of skin: Secondary | ICD-10-CM | POA: Diagnosis not present

## 2023-12-21 DIAGNOSIS — Z7189 Other specified counseling: Secondary | ICD-10-CM | POA: Diagnosis not present

## 2024-04-16 ENCOUNTER — Telehealth: Payer: Self-pay | Admitting: Cardiovascular Disease

## 2024-04-16 DIAGNOSIS — I10 Essential (primary) hypertension: Secondary | ICD-10-CM

## 2024-04-16 NOTE — Telephone Encounter (Signed)
 Pt c/o BP issue: STAT if pt c/o blurred vision, one-sided weakness or slurred speech.  STAT if BP is GREATER than 180/120 TODAY.  STAT if BP is LESS than 90/60 and SYMPTOMATIC TODAY  1. What is your BP concern? See below  2. Have you taken any BP medication today?  Not taken yet  3. What are your last 5 BP readings?  Not available  4. Are you having any other symptoms (ex. Dizziness, headache, blurred vision, passed out)?   Minor headache   Patient is concerned her BP has been trending high.

## 2024-04-16 NOTE — Telephone Encounter (Signed)
 Called and spoke to patient and patient reports her blood pressure has been elevated last couple days. Per patient she went to vascular provider, Dr. Donnice Fear at [facility] 1/13 for swelling left arm. Per patient provider will do fistula gram next week.  Per patient she is able to provider blood pressures.  1/9 Friday 145/84, hr 69, Sat seen in ER 184/94, Hr 59 , Sun 157/93, Hr 57 and  and second time 149/93, Hr 57 Mon 137/82, 69, Tues 1/13 162/83. Per pt she called kidney on call provider and  on Monday was instructed by kidney provider to take extra dose of coreg  12.5 on 1/13 for about one week. Per patient she 's taking blood pressure medications as directed. Pt denies dizziness and lightheadedness and blurred vision. Made patient aware this will be forward to Dr. Court for advice.

## 2024-04-16 NOTE — Telephone Encounter (Signed)
 Called and gave patient Dr. Court recommendations.Made patient aware per Dr. Court to check blood pressure for next 2 weeks and send those readings.Made patient aware per Dr. Court request Pharm D order has been placed and someone will reach out to schedule that appointment. Understanding verbalized.
